# Patient Record
Sex: Female | Born: 1960
Health system: Southern US, Community
[De-identification: ages and names within clinical notes are randomized; demographics above are authoritative.]

## PROBLEM LIST (undated history)

## (undated) DIAGNOSIS — R202 Paresthesia of skin: Secondary | ICD-10-CM

## (undated) DIAGNOSIS — F419 Anxiety disorder, unspecified: Secondary | ICD-10-CM

## (undated) DIAGNOSIS — M549 Dorsalgia, unspecified: Secondary | ICD-10-CM

## (undated) DIAGNOSIS — K219 Gastro-esophageal reflux disease without esophagitis: Secondary | ICD-10-CM

## (undated) DIAGNOSIS — Z9889 Other specified postprocedural states: Secondary | ICD-10-CM

## (undated) DIAGNOSIS — R351 Nocturia: Secondary | ICD-10-CM

## (undated) DIAGNOSIS — I4891 Unspecified atrial fibrillation: Secondary | ICD-10-CM

## (undated) DIAGNOSIS — F32A Depression, unspecified: Secondary | ICD-10-CM

## (undated) DIAGNOSIS — Z8709 Personal history of other diseases of the respiratory system: Secondary | ICD-10-CM

## (undated) DIAGNOSIS — M779 Enthesopathy, unspecified: Secondary | ICD-10-CM

## (undated) DIAGNOSIS — R200 Anesthesia of skin: Secondary | ICD-10-CM

## (undated) DIAGNOSIS — M2550 Pain in unspecified joint: Secondary | ICD-10-CM

## (undated) DIAGNOSIS — G4730 Sleep apnea, unspecified: Secondary | ICD-10-CM

## (undated) DIAGNOSIS — G43909 Migraine, unspecified, not intractable, without status migrainosus: Secondary | ICD-10-CM

## (undated) DIAGNOSIS — G540 Brachial plexus disorders: Secondary | ICD-10-CM

## (undated) DIAGNOSIS — R112 Nausea with vomiting, unspecified: Secondary | ICD-10-CM

## (undated) DIAGNOSIS — R911 Solitary pulmonary nodule: Secondary | ICD-10-CM

## (undated) DIAGNOSIS — K5790 Diverticulosis of intestine, part unspecified, without perforation or abscess without bleeding: Secondary | ICD-10-CM

## (undated) DIAGNOSIS — F329 Major depressive disorder, single episode, unspecified: Secondary | ICD-10-CM

## (undated) DIAGNOSIS — G4700 Insomnia, unspecified: Secondary | ICD-10-CM

## (undated) DIAGNOSIS — Z8719 Personal history of other diseases of the digestive system: Secondary | ICD-10-CM

## (undated) DIAGNOSIS — R3915 Urgency of urination: Secondary | ICD-10-CM

## (undated) DIAGNOSIS — K589 Irritable bowel syndrome without diarrhea: Secondary | ICD-10-CM

## (undated) DIAGNOSIS — E119 Type 2 diabetes mellitus without complications: Secondary | ICD-10-CM

## (undated) DIAGNOSIS — E039 Hypothyroidism, unspecified: Secondary | ICD-10-CM

## (undated) HISTORY — DX: Sleep apnea, unspecified: G47.30

## (undated) HISTORY — DX: Type 2 diabetes mellitus without complications: E11.9

## (undated) HISTORY — DX: Migraine, unspecified, not intractable, without status migrainosus: G43.909

## (undated) HISTORY — PX: CHOLECYSTECTOMY: SHX55

## (undated) HISTORY — PX: ABDOMINAL EXPLORATION SURGERY: SHX538

## (undated) HISTORY — DX: Hypothyroidism, unspecified: E03.9

## (undated) HISTORY — DX: Gastro-esophageal reflux disease without esophagitis: K21.9

## (undated) HISTORY — PX: TONSILLECTOMY: SUR1361

## (undated) HISTORY — PX: THYROIDECTOMY: SHX17

## (undated) HISTORY — PX: ESOPHAGOGASTRODUODENOSCOPY: SHX1529

## (undated) HISTORY — PX: COLONOSCOPY: SHX174

---

## 1998-05-17 ENCOUNTER — Observation Stay (HOSPITAL_COMMUNITY): Admission: EM | Admit: 1998-05-17 | Discharge: 1998-05-18 | Payer: Self-pay | Admitting: Emergency Medicine

## 1998-11-29 ENCOUNTER — Other Ambulatory Visit: Admission: RE | Admit: 1998-11-29 | Discharge: 1998-11-29 | Payer: Self-pay | Admitting: *Deleted

## 1999-11-27 ENCOUNTER — Other Ambulatory Visit: Admission: RE | Admit: 1999-11-27 | Discharge: 1999-11-27 | Payer: Self-pay | Admitting: *Deleted

## 2000-09-18 ENCOUNTER — Encounter: Payer: Self-pay | Admitting: Emergency Medicine

## 2000-09-18 ENCOUNTER — Emergency Department (HOSPITAL_COMMUNITY): Admission: EM | Admit: 2000-09-18 | Discharge: 2000-09-18 | Payer: Self-pay | Admitting: Emergency Medicine

## 2000-09-19 ENCOUNTER — Emergency Department (HOSPITAL_COMMUNITY): Admission: EM | Admit: 2000-09-19 | Discharge: 2000-09-19 | Payer: Self-pay | Admitting: Emergency Medicine

## 2001-01-15 ENCOUNTER — Other Ambulatory Visit: Admission: RE | Admit: 2001-01-15 | Discharge: 2001-01-15 | Payer: Self-pay | Admitting: *Deleted

## 2001-04-17 ENCOUNTER — Ambulatory Visit (HOSPITAL_COMMUNITY): Admission: RE | Admit: 2001-04-17 | Discharge: 2001-04-17 | Payer: Self-pay | Admitting: General Surgery

## 2001-10-26 ENCOUNTER — Emergency Department (HOSPITAL_COMMUNITY): Admission: EM | Admit: 2001-10-26 | Discharge: 2001-10-27 | Payer: Self-pay | Admitting: Emergency Medicine

## 2001-10-26 ENCOUNTER — Encounter: Payer: Self-pay | Admitting: Emergency Medicine

## 2001-10-31 ENCOUNTER — Encounter: Payer: Self-pay | Admitting: Internal Medicine

## 2001-10-31 ENCOUNTER — Ambulatory Visit (HOSPITAL_COMMUNITY): Admission: RE | Admit: 2001-10-31 | Discharge: 2001-10-31 | Payer: Self-pay | Admitting: Internal Medicine

## 2001-11-28 ENCOUNTER — Ambulatory Visit (HOSPITAL_COMMUNITY): Admission: RE | Admit: 2001-11-28 | Discharge: 2001-11-28 | Payer: Self-pay | Admitting: Gastroenterology

## 2001-11-28 ENCOUNTER — Encounter (INDEPENDENT_AMBULATORY_CARE_PROVIDER_SITE_OTHER): Payer: Self-pay | Admitting: Specialist

## 2001-11-29 ENCOUNTER — Encounter: Payer: Self-pay | Admitting: Emergency Medicine

## 2001-11-29 ENCOUNTER — Emergency Department (HOSPITAL_COMMUNITY): Admission: EM | Admit: 2001-11-29 | Discharge: 2001-11-29 | Payer: Self-pay | Admitting: Emergency Medicine

## 2002-06-17 ENCOUNTER — Encounter: Payer: Self-pay | Admitting: Family Medicine

## 2002-06-17 ENCOUNTER — Ambulatory Visit (HOSPITAL_COMMUNITY): Admission: RE | Admit: 2002-06-17 | Discharge: 2002-06-17 | Payer: Self-pay | Admitting: Family Medicine

## 2003-05-18 ENCOUNTER — Ambulatory Visit (HOSPITAL_COMMUNITY): Admission: RE | Admit: 2003-05-18 | Discharge: 2003-05-18 | Payer: Self-pay | Admitting: *Deleted

## 2003-05-18 ENCOUNTER — Encounter: Payer: Self-pay | Admitting: *Deleted

## 2004-02-07 ENCOUNTER — Encounter (HOSPITAL_COMMUNITY): Admission: RE | Admit: 2004-02-07 | Discharge: 2004-05-07 | Payer: Self-pay | Admitting: Endocrinology

## 2004-02-29 ENCOUNTER — Emergency Department (HOSPITAL_COMMUNITY): Admission: EM | Admit: 2004-02-29 | Discharge: 2004-03-01 | Payer: Self-pay | Admitting: Podiatry

## 2004-05-28 ENCOUNTER — Inpatient Hospital Stay (HOSPITAL_COMMUNITY): Admission: EM | Admit: 2004-05-28 | Discharge: 2004-06-01 | Payer: Self-pay | Admitting: *Deleted

## 2004-08-15 ENCOUNTER — Encounter: Admission: RE | Admit: 2004-08-15 | Discharge: 2004-08-15 | Payer: Self-pay | Admitting: Thoracic Surgery

## 2004-10-05 ENCOUNTER — Encounter (INDEPENDENT_AMBULATORY_CARE_PROVIDER_SITE_OTHER): Payer: Self-pay | Admitting: Specialist

## 2004-10-06 ENCOUNTER — Inpatient Hospital Stay (HOSPITAL_COMMUNITY): Admission: RE | Admit: 2004-10-06 | Discharge: 2004-10-07 | Payer: Self-pay | Admitting: Surgery

## 2004-11-20 ENCOUNTER — Ambulatory Visit (HOSPITAL_COMMUNITY): Admission: RE | Admit: 2004-11-20 | Discharge: 2004-11-20 | Payer: Self-pay | Admitting: Family Medicine

## 2004-12-01 ENCOUNTER — Ambulatory Visit: Admission: RE | Admit: 2004-12-01 | Discharge: 2004-12-01 | Payer: Self-pay | Admitting: Family Medicine

## 2004-12-13 ENCOUNTER — Encounter: Admission: RE | Admit: 2004-12-13 | Discharge: 2004-12-13 | Payer: Self-pay | Admitting: Thoracic Surgery

## 2005-03-14 ENCOUNTER — Encounter: Admission: RE | Admit: 2005-03-14 | Discharge: 2005-03-14 | Payer: Self-pay | Admitting: Obstetrics and Gynecology

## 2005-04-18 ENCOUNTER — Emergency Department (HOSPITAL_COMMUNITY): Admission: EM | Admit: 2005-04-18 | Discharge: 2005-04-19 | Payer: Self-pay | Admitting: Emergency Medicine

## 2005-06-03 ENCOUNTER — Emergency Department (HOSPITAL_COMMUNITY): Admission: EM | Admit: 2005-06-03 | Discharge: 2005-06-03 | Payer: Self-pay | Admitting: Family Medicine

## 2005-06-25 ENCOUNTER — Emergency Department (HOSPITAL_COMMUNITY): Admission: EM | Admit: 2005-06-25 | Discharge: 2005-06-25 | Payer: Self-pay | Admitting: Family Medicine

## 2005-07-03 ENCOUNTER — Encounter: Admission: RE | Admit: 2005-07-03 | Discharge: 2005-07-03 | Payer: Self-pay | Admitting: Thoracic Surgery

## 2005-10-21 ENCOUNTER — Emergency Department (HOSPITAL_COMMUNITY): Admission: EM | Admit: 2005-10-21 | Discharge: 2005-10-21 | Payer: Self-pay | Admitting: Emergency Medicine

## 2005-10-28 ENCOUNTER — Emergency Department (HOSPITAL_COMMUNITY): Admission: EM | Admit: 2005-10-28 | Discharge: 2005-10-28 | Payer: Self-pay | Admitting: Emergency Medicine

## 2005-10-30 ENCOUNTER — Ambulatory Visit (HOSPITAL_COMMUNITY): Admission: RE | Admit: 2005-10-30 | Discharge: 2005-10-30 | Payer: Self-pay | Admitting: Obstetrics and Gynecology

## 2005-10-30 ENCOUNTER — Encounter (INDEPENDENT_AMBULATORY_CARE_PROVIDER_SITE_OTHER): Payer: Self-pay | Admitting: *Deleted

## 2005-10-31 ENCOUNTER — Inpatient Hospital Stay (HOSPITAL_COMMUNITY): Admission: AD | Admit: 2005-10-31 | Discharge: 2005-10-31 | Payer: Self-pay | Admitting: Obstetrics and Gynecology

## 2005-11-21 ENCOUNTER — Emergency Department (HOSPITAL_COMMUNITY): Admission: EM | Admit: 2005-11-21 | Discharge: 2005-11-21 | Payer: Self-pay | Admitting: Emergency Medicine

## 2005-11-30 ENCOUNTER — Inpatient Hospital Stay (HOSPITAL_COMMUNITY): Admission: EM | Admit: 2005-11-30 | Discharge: 2005-12-04 | Payer: Self-pay | Admitting: Emergency Medicine

## 2005-12-25 ENCOUNTER — Emergency Department (HOSPITAL_COMMUNITY): Admission: EM | Admit: 2005-12-25 | Discharge: 2005-12-25 | Payer: Self-pay | Admitting: Emergency Medicine

## 2006-01-01 ENCOUNTER — Emergency Department (HOSPITAL_COMMUNITY): Admission: EM | Admit: 2006-01-01 | Discharge: 2006-01-01 | Payer: Self-pay | Admitting: Emergency Medicine

## 2006-01-17 ENCOUNTER — Emergency Department (HOSPITAL_COMMUNITY): Admission: EM | Admit: 2006-01-17 | Discharge: 2006-01-17 | Payer: Self-pay | Admitting: Emergency Medicine

## 2006-01-22 ENCOUNTER — Emergency Department (HOSPITAL_COMMUNITY): Admission: EM | Admit: 2006-01-22 | Discharge: 2006-01-22 | Payer: Self-pay | Admitting: Emergency Medicine

## 2006-01-23 ENCOUNTER — Encounter: Admission: RE | Admit: 2006-01-23 | Discharge: 2006-01-23 | Payer: Self-pay | Admitting: Thoracic Surgery

## 2006-01-29 ENCOUNTER — Emergency Department (HOSPITAL_COMMUNITY): Admission: EM | Admit: 2006-01-29 | Discharge: 2006-01-29 | Payer: Self-pay | Admitting: Emergency Medicine

## 2006-06-20 ENCOUNTER — Emergency Department (HOSPITAL_COMMUNITY): Admission: EM | Admit: 2006-06-20 | Discharge: 2006-06-21 | Payer: Self-pay | Admitting: Emergency Medicine

## 2006-09-29 ENCOUNTER — Emergency Department (HOSPITAL_COMMUNITY): Admission: EM | Admit: 2006-09-29 | Discharge: 2006-09-29 | Payer: Self-pay | Admitting: Emergency Medicine

## 2006-10-25 ENCOUNTER — Emergency Department (HOSPITAL_COMMUNITY): Admission: EM | Admit: 2006-10-25 | Discharge: 2006-10-25 | Payer: Self-pay | Admitting: Emergency Medicine

## 2006-11-11 ENCOUNTER — Encounter: Admission: RE | Admit: 2006-11-11 | Discharge: 2006-11-11 | Payer: Self-pay | Admitting: Anesthesiology

## 2006-12-08 ENCOUNTER — Emergency Department (HOSPITAL_COMMUNITY): Admission: EM | Admit: 2006-12-08 | Discharge: 2006-12-08 | Payer: Self-pay | Admitting: Emergency Medicine

## 2007-02-04 ENCOUNTER — Encounter: Admission: RE | Admit: 2007-02-04 | Discharge: 2007-02-04 | Payer: Self-pay | Admitting: Thoracic Surgery

## 2007-02-04 ENCOUNTER — Ambulatory Visit: Payer: Self-pay | Admitting: Thoracic Surgery

## 2007-02-25 ENCOUNTER — Emergency Department (HOSPITAL_COMMUNITY): Admission: EM | Admit: 2007-02-25 | Discharge: 2007-02-25 | Payer: Self-pay | Admitting: Emergency Medicine

## 2007-05-27 ENCOUNTER — Inpatient Hospital Stay (HOSPITAL_COMMUNITY): Admission: EM | Admit: 2007-05-27 | Discharge: 2007-05-30 | Payer: Self-pay | Admitting: Emergency Medicine

## 2007-09-23 ENCOUNTER — Emergency Department (HOSPITAL_COMMUNITY): Admission: EM | Admit: 2007-09-23 | Discharge: 2007-09-23 | Payer: Self-pay | Admitting: Emergency Medicine

## 2008-02-10 ENCOUNTER — Emergency Department (HOSPITAL_COMMUNITY): Admission: EM | Admit: 2008-02-10 | Discharge: 2008-02-10 | Payer: Self-pay | Admitting: Emergency Medicine

## 2008-06-11 ENCOUNTER — Emergency Department (HOSPITAL_COMMUNITY): Admission: EM | Admit: 2008-06-11 | Discharge: 2008-06-11 | Payer: Self-pay | Admitting: Emergency Medicine

## 2008-08-02 ENCOUNTER — Emergency Department (HOSPITAL_COMMUNITY): Admission: EM | Admit: 2008-08-02 | Discharge: 2008-08-02 | Payer: Self-pay | Admitting: Emergency Medicine

## 2009-04-28 ENCOUNTER — Emergency Department (HOSPITAL_COMMUNITY): Admission: EM | Admit: 2009-04-28 | Discharge: 2009-04-28 | Payer: Self-pay | Admitting: Emergency Medicine

## 2009-06-29 ENCOUNTER — Emergency Department (HOSPITAL_COMMUNITY): Admission: EM | Admit: 2009-06-29 | Discharge: 2009-06-30 | Payer: Self-pay | Admitting: Emergency Medicine

## 2009-08-05 ENCOUNTER — Encounter: Admission: RE | Admit: 2009-08-05 | Discharge: 2009-08-05 | Payer: Self-pay | Admitting: Family Medicine

## 2009-09-18 ENCOUNTER — Emergency Department (HOSPITAL_COMMUNITY): Admission: EM | Admit: 2009-09-18 | Discharge: 2009-09-18 | Payer: Self-pay | Admitting: Emergency Medicine

## 2010-03-19 ENCOUNTER — Emergency Department (HOSPITAL_COMMUNITY): Admission: EM | Admit: 2010-03-19 | Discharge: 2010-03-19 | Payer: Self-pay | Admitting: Emergency Medicine

## 2010-04-12 ENCOUNTER — Emergency Department (HOSPITAL_COMMUNITY): Admission: EM | Admit: 2010-04-12 | Discharge: 2010-04-12 | Payer: Self-pay | Admitting: Emergency Medicine

## 2010-08-21 ENCOUNTER — Encounter: Admission: RE | Admit: 2010-08-21 | Discharge: 2010-08-21 | Payer: Self-pay | Admitting: Obstetrics

## 2010-09-10 ENCOUNTER — Emergency Department (HOSPITAL_COMMUNITY)
Admission: EM | Admit: 2010-09-10 | Discharge: 2010-09-10 | Payer: Self-pay | Source: Home / Self Care | Admitting: Emergency Medicine

## 2010-09-24 ENCOUNTER — Emergency Department (HOSPITAL_COMMUNITY)
Admission: EM | Admit: 2010-09-24 | Discharge: 2010-09-25 | Payer: Self-pay | Source: Home / Self Care | Admitting: Emergency Medicine

## 2010-10-21 ENCOUNTER — Encounter: Payer: Self-pay | Admitting: Obstetrics

## 2011-01-01 LAB — DIFFERENTIAL
Eosinophils Absolute: 0.1 10*3/uL (ref 0.0–0.7)
Eosinophils Relative: 1 % (ref 0–5)
Lymphs Abs: 1.6 10*3/uL (ref 0.7–4.0)
Monocytes Absolute: 0.2 10*3/uL (ref 0.1–1.0)
Monocytes Relative: 3 % (ref 3–12)

## 2011-01-01 LAB — BASIC METABOLIC PANEL
CO2: 24 mEq/L (ref 19–32)
Chloride: 104 mEq/L (ref 96–112)
GFR calc Af Amer: >60 mL/min (ref >60)
Potassium: 3.7 mEq/L (ref 3.5–5.1)

## 2011-01-01 LAB — CBC
HCT: 43.5 % (ref 36.0–46.0)
Hemoglobin: 14.9 g/dL (ref 12.0–15.0)
MCHC: 34.2 g/dL (ref 30.0–36.0)
MCV: 91.5 fL (ref 78.0–100.0)
RBC: 4.76 MIL/uL (ref 3.87–5.11)
WBC: 7.7 10*3/uL (ref 4.0–10.5)

## 2011-01-01 LAB — POCT CARDIAC MARKERS: Myoglobin, poc: 93 ng/mL (ref 12–200)

## 2011-02-13 NOTE — H&P (Signed)
Holly [name], Holly [name]              ACCOUNT NO.:  [id number]   MEDICAL RECORD NO.:  [id number]          PATIENT TYPE:  INP   LOCATION:  0103                         FACILITY:  [facility]   PHYSICIAN:  Michelene Gardener, MD    DATE OF BIRTH:  [birth year]   DATE OF ADMISSION:  05/27/2007  DATE OF DISCHARGE:                              HISTORY & PHYSICAL   PRIMARY CARE PHYSICIAN:  Holley Bouche, M.D.   CHIEF COMPLAINT:  Increasing chest pain.   HISTORY OF PRESENT ILLNESS:  This is a 50 year old female with past  medical history of anxiety, GERD, hypothyroidism, and migraines who now  presents with above-mentioned complaint . Her condition started 2-3 days  ago when she developed lower mid sternal chest pain described as sharp  and sometimes feeling of tightness in the middle of her chest, ranging  from 5 to 9/10 with radiation to her left shoulder, associated with some  numbness in her hands.  She also had nausea, and she vomited once.  Today her pain was increasing.  It is around 9/10, and as stated, it is  associated with nausea.  She has vomited once, and she was clammy.  She  was also having increasing shortness of breath almost all of the time,  especially with her pain.  She is known for anxiety, but she says this  pain is different from whatever she had before.   PAST MEDICAL HISTORY:  Significant for:  1. Hypothyroidism.  2. Anxiety.  3. GERD.  4. Previous history of chest pain for which she was admitted in August      2005.   PAST SURGICAL HISTORY:  Laparoscopic cholecystectomy.   ALLERGIES:  No known drug allergies.   MEDICATIONS:  1. Synthroid 137 mcg p.o. once daily.  2. Prevacid 30 mg p.o. once daily.  3. Hydromorphone 4 mg q. 4 h as needed.  4. Demerol 1.5 q. 6 h as needed.  5. Ativan 2 mg q. 8 h as needed.   FAMILY HISTORY:  A sister has diabetes.  Her mother has history of  diabetes. Two of her aunts had coronary artery disease.  All  grandparents had coronary  artery disease. Her grandfather did have  coronary artery disease at age 63.   SOCIAL HISTORY:  Denies smoking, denies alcohol ingestion, denies  recreational drugs.  She is married and lives with her husband.   REVIEW OF SYSTEMS:  As in HPI.   PHYSICAL EXAMINATION:  VITAL SIGNS:  Temperature 98.4, blood pressure  129/84, pulse 98, respiratory rate 18.  GENERAL APPEARANCE:  This is an obese, middle-age, Caucasian female not  in acute distress.  HEENT:  Conjunctivae showed no erythema.  Pupils equal, round, and  reactive to light and accommodation.  There is no ptosis.  Hearing is  intact.  There is no ear discharge or infection.  There is no nasal  discharge, infection, or bleeding.  Oral mucosa is dry.  No pharyngeal  erythema.  NECK:  Supple.  No JVD, no carotid bruit, no lymphadenopathy.  No  thyroid enlargement, no thyroid tenderness.  CARDIOVASCULAR:  S1  and S2 regular.  There are no murmurs, no gallops,  and no thrills.  RESPIRATORY:  On examination, the patient is breathing between 16-18.  There is no use of accessory muscles.  No intercostal retractions.  No  dullness, no rales, no rhonchi, no wheezes.  ABDOMEN:  Soft, nondistended, nontender.  No hepatosplenomegaly.  Bowel  sounds are normal.  Umbilicus is central.  EXTREMITIES:  Lower extremities are without edema.  No rash, no varicose  veins.  SKIN:  No rash and no erythema.  NEUROLOGIC:  Cranial nerves II-XII intact.  There are no motor or  sensory deficits.   LABORATORY RESULTS:  WBC 5.6, hemoglobin 13.7, hematocrit 39.8, MCV 88,  platelet count 237.  AST 24, ALT 16, bilirubin 0.6.  Sodium 141,  potassium 3.6, chloride 105, bicarb 29, glucose 160, BUN 11, creatinine  0.92, calcium 9.7.  CK-MB 2.1, troponin less than 0.05.   Chest x-ray showed no active disease.   EKG is normal sinus rhythm at rate of 94 beats per minute.  There is no  evidence of ischemia.   IMPRESSION AND ASSESSMENT:  1. Chest pain:  This  patient's pain is typical.  Risk factors would      include obesity and positive family history.  She also has history      of gastroesophageal reflux disease and anxiety that might      contributed to her pain.  I will admit her to telemetry.  I will      get 3 sets of troponin and cardiac enzymes.  Will get serial EKGs.      We will consider cardiology evaluation in the morning for inpatient      versus outpatient stress test.  2. Gastroesophageal reflux disease:  Will continue Prevacid.  3. Anxiety:  Will continue Ativan as needed.  4. Migraines:  This patient is taking Demerol and hydromorphone as      needed.  She has been taking this since 2007 and has been followed      by pain clinic.  So we will continue as given as an outpatient.  5. Hypothyroidism:  Will continue Synthroid and will get a TSH level.   TIME AT DISCHARGE:  1 hour.      Michelene Gardener, MD  Electronically Signed     NAE/MEDQ  D:  05/27/2007  T:  05/27/2007  Job:  [id number]   cc:   Holley Bouche, M.D.  Fax: [phone]

## 2011-02-13 NOTE — Discharge Summary (Signed)
NAMEBRITTANYA, Holly [name]              ACCOUNT NO.:  [id number]   MEDICAL RECORD NO.:  [id number]          PATIENT TYPE:  INP   LOCATION:  1428                         FACILITY:  [facility]   PHYSICIAN:  [name], M.D.DATE OF BIRTH:  1961-03-05   DATE OF ADMISSION:  05/27/2007  DATE OF DISCHARGE:  05/30/2007                               DISCHARGE SUMMARY   DISCHARGE DIAGNOSES:  1. Chest pain, cardiac versus gastrointestinal/gastroesophageal reflux      disease.  Outpatient stress test per cardiology (Dr. Anne Fu) ruled      out for myocardial infarction by cardiac enzymes.  2. Gastroesophageal reflux disease.  3. Hypothyroidism.  4. Anxiety.  5. Chronic migraines.   CONSULTATIONS:  Cardiology - Dr. Anne Fu [facility]).   PROCEDURES AND STUDIES:  CT angiogram of chest:  Negative for pulmonary  emboli.   BRIEF HISTORY:  The patient is a 50 year old white female with the above-  listed medical problems, who presented with complaints of increasing  chest pain.  She reported that, about two to three days prior to  admission, she developed lower mid-sternal chest pain, described as  sharp, and a feeling of tightness in the middle of her chest, ranging  from 5-9/10 in intensity, and radiating to her left shoulder, also  associated with numbness in her hands.  She admitted to associated  nausea and vomiting times one.  She stated that, on the day of  admission, the pain was worsening and she also felt clammy with  increasing shortness of breath and so she came to the ER.   Please see the full admission history and physical, dictated on May 27, 2007, by Dr. Arthor Captain for the details of the admission physical exam,  as well as the laboratory data.   HOSPITAL COURSE:  1. Chest pain:  Upon admission, the patient was placed on aspirin,      p.r.n. nitrates and a beta blocker.  Serial cardiac enzymes were      done and these were negative for MI.  The patient continued to have   chest pain and so cardiology was consulted for further      recommendations.  Dr. Anne Fu saw the patient and, following his      evaluation, indicated that it was reassuring that her cardiac      enzymes were negative times three and also her EKG with no ischemic      changes, and so decided on doing an outpatient stress test, which      his office will schedule.  The patient is discharged on aspirin and      p.r.n. nitrates and is to follow up with cardiology, as already      indicated, as well as her primary care physician.  She had one      blood glucose that was elevated at 160 and, as part of the      evaluation of her risk factors for heart disease, a hemoglobin A1c      was done and this was within normal limits at 6.  She is to follow  up with her primary care physician for further monitoring.  She has      been instructed on lifestyle changes, including her diet and      exercise.  2. GERD:  The patient was placed on a PPI, Prevacid b.i.d., during her      hospital stay.  Carafate was also added, but the patient stated      that she did not get any further relief with the Carafate and so      this has been discontinued.  The patient is to follow up with her      gastroenterologist upon discharge, as instructed.  3. Chronic migraine headaches:  Patient to continue her preadmission      medications and is to follow up at the pain clinic today, as      scheduled.  4. Hypothyroidism:  The patient had a TSH level done in the hospital      and this was within normal limits at 2.40.  She is to continue her      Synthroid upon discharge.   DISCHARGE MEDICATIONS:  1. Aspirin 81 mg p.o. daily.  2. Nitroglycerin 0.4 mg p.r.n.  3. Patient to continue preadmission medications:  Synthroid, Prevacid,      hydromorphone, Demerol, Ativan.   DISCHARGE CONDITION:  Stable.   FOLLOWUP CARE:  1. Dr. Anne Fu as scheduled for outpatient stress test and 2D echo.  2. Dr. Tiburcio Pea as scheduled.   3. Dr. Danise Edge, GI, patient to call for appointment.      [name], M.D.  Electronically Signed     ACV/MEDQ  D:  05/30/2007  T:  05/30/2007  Job:  [id number]   cc:   Holley Bouche, M.D.  Fax: [phone]   Jake Bathe, MD  Fax: [phone]   Danise Edge, M.D.  Fax: [phone]

## 2011-02-13 NOTE — Consult Note (Signed)
Holly [name], Holly [name]              ACCOUNT NO.:  [id number]   MEDICAL RECORD NO.:  [id number]          PATIENT TYPE:  INP   LOCATION:  1428                         FACILITY:  [facility]   PHYSICIAN:  Jake Bathe, MD      DATE OF BIRTH:  09/25/1961   DATE OF CONSULTATION:  DATE OF DISCHARGE:                                 CONSULTATION   REFERRING PHYSICIAN:  Nadear A. Arthor Captain, MD   REASON FOR CONSULTATION:  [name] is being seen at the request of  Dr. Arthor Captain for the evaluation of chest pain.   HISTORY OF PRESENT ILLNESS:  Holly [name] is a 50 year old female with  chronic migraines, hypothyroidism and gastroesophageal reflux disease  with previous history of esophageal spasm, who was admitted after 3 days  of intermittent substernal chest pain which was sometimes described as a  pressure and sometimes sharp with radiation over the chest wall.  She  noted this once while shopping and with activity and had associated  nausea and vomiting.  This prompted her to come to the [facility] emergency department, where she was admitted for further  evaluation.  Thus far her cardiac enzymes have all been normal.  Her D-  dimer was positive and she had a CT angiogram of her chest, which showed  no evidence of pulmonary embolism.   She does not complain of any syncope or presyncope, however did have a  few palpitations and has chronic migraines and fatigue.  With this chest  pain she also had associated dyspnea and has noted over the past several  weeks some increase in work of breathing.   PAST MEDICAL HISTORY:  1. Chronic migraines.  2. Hypothyroidism.  3. GERD.  4. Esophageal spasm.  5. Obesity.   PAST SURGICAL HISTORY:  Cholecystectomy.   ALLERGIES:  MORPHINE and PHENERGAN.   MEDICATIONS HERE:  1. Aspirin 81 mg once a day.  2. Lovenox 40 mg subcu daily.  3. Prevacid 30 mg once a day.  4. Synthroid 137 mcg once a day.  5. Lopressor 12.5 mg twice a day.   SOCIAL  HISTORY:  The patient denies any previous or current tobacco use,  alcohol use, or illicit drug use.   FAMILY HISTORY:  Her mother has diabetes, her father has hypertension,  but no early family history of coronary artery disease.   REVIEW OF SYSTEMS:  Unless specified above, all of 10 review of systems  negative.   PHYSICAL EXAMINATION:  VITAL SIGNS:  Temperature 97.7, pulse 71,  respiratory rate 18, blood pressure 102/69, saturating 97% on room air.  GENERAL:  Alert and oriented x3, lying in the bed, looking mildly  uncomfortable and tired, in no acute distress.  EYES:  Well-perfused conjunctivae, no scleral icterus.  HEENT:  Neck is supple.  No thyromegaly.  No carotid bruits bilaterally.  No JVD.  CARDIOVASCULAR:  Regular rate and rhythm.  No murmurs, rubs or gallops.  A normal PMI.  LUNGS:  Clear to auscultation bilaterally.  No wheezes.  No rales.  ABDOMEN:  Obese, positive bowel sounds.  Nontender.  EXTREMITIES:  No clubbing, cyanosis, or edema.  Trace pretibial edema  noted bilaterally.  2+ dorsalis pedis pulses, bilateral lower  extremities.  NEUROLOGIC:  Nonfocal.  No tremors.  SKIN:  Warm, dry and intact.   LABORATORY DATA:  D-dimer 0.64, which is elevated.  TSH 2.4, normal.  Cardiac enzymes negative x3.  Potassium 3.6, creatinine 0.9, glucose  160, which is elevated.  White count 5.6, hematocrit 39.8, platelets  237.  LFTs were within normal limits.  Chest x-ray showed no acute  disease.  Chest CT showed no evidence of pulmonary embolism.  EKG  demonstrates normal sinus rhythm, rate of 67, with nonspecific ST-T  changes.   ASSESSMENT AND PLAN:  A 50 year old female with possible acute coronary  syndrome, admitted to [facility] for further observation with  chronic migraines and newly-associated dyspnea.   1. Possible acute coronary syndrome.  The patient's cardiac enzymes x3      have been normal.  The EKG has not shown any specific ischemic      changes,  which is reassuring.  She does, however, still have      occasional bouts of this chest discomfort.  According to her HPI,      she does have some typical symptoms for angina but I have reassured      her that she has not had a myocardial infarction during this      hospitalization.  Given these typical anginal features, I will      schedule her for an outpatient cardiac stress test.  2. Dyspnea.  This has been associated with her chest discomfort and      she has noted increased dyspnea on exertion over the past few      months.  Given this finding also, I will perform an echocardiogram      to further evaluate her LV function and to assure that there is no      diastolic dysfunction present.  3. Chronic migraines.  Difficult to control on prophylactic medicine.  4. History of esophageal spasm.  Pain may be secondary to      gastroesophageal reflux disease and/or esophageal spasm; however,      it is necessary to rule out possible cardiac etiologies.      Jake Bathe, MD  Electronically Signed     MCS/MEDQ  D:  05/28/2007  T:  05/29/2007  Job:  [id number]   cc:   Michelene Gardener, MD

## 2011-02-16 NOTE — Op Note (Signed)
[name], [name]              ACCOUNT NO.:  [id number]   MEDICAL RECORD NO.:  [id number]          PATIENT TYPE:  AMB   LOCATION:  DAY                          FACILITY:  [facility]   PHYSICIAN:  Velora Heckler, MD      DATE OF BIRTH:  Aug 12, 1961   DATE OF PROCEDURE:  10/05/2004  DATE OF DISCHARGE:                                 OPERATIVE REPORT   PREOPERATIVE DIAGNOSIS:  Hyperthyroidism.   POSTOPERATIVE DIAGNOSIS:  Hyperthyroidism.   PROCEDURE:  Total thyroidectomy.   SURGEON:  Velora Heckler.   ASSISTANTS:  Adolph Pollack, M.D.   ANESTHESIA:  General, per Dr. Ronelle Nigh.   ESTIMATED BLOOD LOSS:  30 cc.   PREPARATION:  Betadine.   COMPLICATIONS:  None.   INDICATIONS:  The patient is a 50 year old white female who presents at the  request of Dorisann Frames, M.D. for hyperthyroidism. The patient has had  thyroid function tests showing significant hyperthyroidism. She underwent  radioactive iodine treatment. However post treatment, she remained  hyperthyroid. She does not wish to have radioactive iodine administration  repeated. Therefore she comes to surgery for thyroidectomy.   DESCRIPTION OF PROCEDURE:  The procedure was done in OR #6 at the [facility]. The patient was brought to the operating room,  placed in supine position on the operating room table. Following  administration of general anesthesia the patient is positioned and then  prepped and draped in usual strict aseptic fashion. After ascertaining  adequate level of anesthesia was obtained, Kocher incision is made with a  #10 blade. Dissection was carried through subcutaneous tissues and platysma.  Hemostasis was obtained with the electrocautery. Skin flaps were elevated  cephalad and caudad from the thyroid notch to the sternal notch. The  Mahorner self-retaining retractor was placed for exposure. Strap muscles  were incised in the midline. Dissection was begun on the left side.  Strap  muscles were reflected laterally. Middle thyroid vein was divided between  Ligaclips.  The gland was gently dissected out. It is somewhat multinodular.  Superior pole was dissected out, ligated in continuity with 2-0 silk ties  and medium Ligaclips, and divided. Gland is rolled medially. Inferior venous  tributaries were divided between Ligaclips. Parathyroid tissue was  identified and preserved. Recurrent laryngeal nerve was identified and  preserved. Branches of the inferior thyroid artery were divided between  small Ligaclips. Ligament of Allyson Sabal was transected with the electrocautery in  the gland is rolled up and onto the anterior surface of the trachea.   Next we turned our attention to the right lobe. Again strap muscles were  reflected laterally. Again middle thyroid vein was divided between  Ligaclips. Gland is rolled medially. Superior pole vessels were dissected  out, ligated in continuity with 2-0 silk ties and medium Ligaclips and  divided. Gland is rolled further anteriorly. Parathyroid tissue was again  identified and preserved. There was a large colloid nodule in the inferior  pole of the gland on the right side. This was partially disrupted during the  case. Inferior venous tributaries were ligated in continuity with 2-0  silk  ties and divided. Branches of the inferior thyroid artery were divided  between small Ligaclips. Gland is rolled anteriorly. The ligament of Allyson Sabal  was transected with electrocautery and the gland is then excised off the  anterior trachea with the electrocautery used for hemostasis. The entire  gland is submitted to pathology for review. Neck is irrigated copiously with  warm saline and good hemostasis was achieved. Surgicel was placed over the  area of the recurrent laryngeal nerves bilaterally. Strap muscles were  reapproximated midline with interrupted 3-0 Vicryl sutures. Platysma was  closed with interrupted 3-0 Vicryl sutures. Skin was  closed with running 4-0  Vicryl subcuticular suture. Wound is washed and dried. Benzoin, Steri-Strips  were applied. Sterile dressings were applied. The patient was awakened from  anesthesia and brought to the recovery room in stable condition. The patient  tolerated the procedure well.     Todd   TMG/MEDQ  D:  10/05/2004  T:  10/05/2004  Job:  [id number]   cc:   Dorisann Frames, M.D.  [address] [place] [address]  Fax: [phone]   Meredith Staggers, M.D.  [address], [address]  [place]  [place] [address]  Fax: [phone]

## 2011-02-16 NOTE — Discharge Summary (Signed)
NAMEDEBORAH, Holly [name]              ACCOUNT NO.:  [id number]   MEDICAL RECORD NO.:  [id number]          PATIENT TYPE:  INP   LOCATION:  3732                         FACILITY:  [facility]   PHYSICIAN:  [name], MD  DATE OF BIRTH:  May 01, 1961   DATE OF ADMISSION:  11/30/2005  DATE OF DISCHARGE:                                 DISCHARGE SUMMARY   CHIEF COMPLAINT AT THE TIME OF ADMISSION:  Nausea, vomiting, and diarrhea,  acute onset x12 hours.   DISCHARGING DIAGNOSES:  1.  Nausea, vomiting, and diarrhea secondary to likely viral      gastroenteritis. Symptoms have resolved with supportive measures.  2.  Migraine. The patient has developed her usual migraine which is      partially responding to her usual treatment of Dilaudid and Phenergan.      She wishes to discharge to home for home care and to visit her      chiropractor. She has an appointment to see Dr. Meryl Crutch on Wednesday and      I will give her enough medication to allow her to make that appointment      Wednesday.  3.  Hypothyroidism. We have continued her on her Synthroid. She will need to      have her TSH checked again in the near future.  4.  Reflux. She has resumed her Prevacid twice daily and she utilizes Pepcid      20 mg b.i.d. p.r.n.  5.  Hyperglycemia. During the course of this illness the patient is noted to      have blood sugars in the 130s to 140s. A hemoglobin A1c is pending and      should be followed up by her primary care physician. I will attempt to      follow this up as well and give the patient a call once it returns.  6.  Right middle lobe lung mass which was diagnosed in the past. She will      continue to follow this with Dr. Edwyna Shell as an outpatient.   MEDICATIONS AT THE TIME OF DISCHARGE:  1.  Synthroid 100 mcg once daily.  2.  Prevacid 30 mg twice daily.  3.  Pepcid as needed.  4.  Lorazepam 1 mg twice daily.  5.  Xanax 2 mg twice daily.  6.  Dilaudid 3 mg per rectum q.6h. p.r.n. for  migraine.  7.  Phenergan - she states she usually takes 50 mg intramuscularly injected;      however, this is above the recommended dosing. I will therefore      prescribe 25 mg intramuscularly every 6 hours until she returns to see      Dr. Meryl Crutch.   The patient has been advised to establish a primary care physician to follow  her blood sugars and hypothyroidism, as well as consider a sleep study for  sleep apnea which may be contributing to some of her migraine disease.   HISTORY OF PRESENT ILLNESS:  The patient is a 50 year old white female with  a past medical history for hypothyroidism and migraines who presented  to the  emergency room after being awakened from sleep acutely in the early morning  hours with nausea, vomiting, and diarrhea that was profuse. She came to the  emergency room. She was treated symptomatically and rehydrated. In the  emergency room she spiked a temperature up to 103. No obvious source was  noted in terms of urine or chest x-ray infection. We felt that this was  likely secondary to a gastroenteritis secondary to virus. The patient  responded favorably to supportive therapy; however, developed a migraine  during the course of the hospital stay which has been relatively refractory  to her usual medications. At this time the patient states she wishes to go  home and see her chiropractor, as well as follow up with Dr. Meryl Crutch on  Wednesday, and she will manage her symptoms using her standard therapy at  home. On the day of discharge the patient did exhibit sinus tachycardia  while in the restroom. I believe this is related to placing a suppository  and/or straining. She has at rest been non tachycardic and has had no  dysrhythmias.   On the day of discharge the patient's vital signs have remained stable, she  has been afebrile. T-max was 99.1, blood pressure 108/78, pulse 84,  respirations 18, saturations 92-95% on room air. Generally, this is a   moderately-obese white female in mild distress secondary to headache. She is  normocephalic, atraumatic. Pupils equal, round, and reactive to light.  Extraocular muscles are intact. Mucous membranes are moist. Neck is supple.  There is no JVD, no lymph nodes, no carotid bruits. The chest is clear to  auscultation. There is no rhonchi, rales, or wheezes. Cardiovascular is  regular rate and rhythm, positive S1 and S2, no S3 or S4. No murmurs, rubs,  or gallops. Abdomen is soft, nontender, nondistended, with positive bowel  sounds. Extremities show no clubbing, cyanosis, or edema. Neurologically she  is awake, alert, and oriented. Cranial nerves II-XII are intact. Power is  5/5. DTRs are 2+.   Please note I offered the patient treatment with Depakene IV. She, however,  stated that she wished to follow up with her usual regime and see Dr.  Meryl Crutch on Wednesday.   PERTINENT LABORATORY VALUES DURING THE COURSE OF THE HOSPITAL STAY:  Reveal  a potassium of 3.9, BUN of 6, creatinine 1.1. CBC is white count of 3.7,  hemoglobin 12.5, hematocrit 36.4, and platelets of 155. Stool cultures have  remained negative. Urine culture has remained negative. C. difficile is  negative. Blood cultures have remained negative x2. As stated, her  hemoglobin A1c is pending.   PERTINENT IMAGING STUDIES:  Two-view chest x-ray showed left lower lobe  atelectasis but no obvious infiltrate.   At this time the patient is deemed stable to follow up as an outpatient for  her migraine disease. I will provide her with minimal doses of Dilaudid  suppositories and Phenergan until she can reach Dr. Meryl Crutch on Wednesday.   Addendum to Discharge:   The patient complained of left sided chest pain with radiation to her neck  and left arm.  An EKG was completed an was NSR with no ST-T wave changes.  Cardiac markers were negative.  Her D-Dimer was slightly elevated, therefore, a CT of the chest was completed.  There was no  evidence for  Pulmonary embolus.  The patient was monitored over night and her symptoms  resolved with treatment of her reflux.    [name], M.D.  12/07/05      [name], MD  Electronically Signed     MLT/MEDQ  D:  12/03/2005  T:  12/03/2005  Job:  [id number]   cc:   Clabe Seal. Meryl Crutch, M.D.  Fax: [phone]

## 2011-02-16 NOTE — Discharge Summary (Signed)
NAME:  Holly [name], Holly [name]                        ACCOUNT NO.:  [id number]   MEDICAL RECORD NO.:  [id number]                   PATIENT TYPE:  INP   LOCATION:  0359                                 FACILITY:  [facility]   PHYSICIAN:  Theone Stanley, MD                DATE OF BIRTH:  1961/03/25   DATE OF ADMISSION:  05/27/2004  DATE OF DISCHARGE:  06/01/2004                                 DISCHARGE SUMMARY   ADMISSION DIAGNOSES:  1.  Chest pain, unclear etiology.  2.  Hyperthyroidism.  3.  Anxiety.  4.  Gastroesophageal reflux disease.   DISCHARGE DIAGNOSES:  1.  Chest pain secondary to costochondritis.  2.  Anxiety.  3.  Gastroesophageal reflux disease.  4.  Hyperthyroidism.   ALLERGIES:  PHENERGAN.   CONSULTATIONS:  GI.   PROCEDURES:  CT angio.   HOSPITAL COURSE:  Holly [name] is a 50 year old white female presenting with  sudden onset of chest pain.  Per the patient, she was shopping in a store at  about 7 p.m. on the 27th when she experienced mid substernal chest  discomfort, mainly localized in the lower sternum.  She said it radiated  down her arm.  It was associated with mild-to-moderate dyspnea.  At that  point, she was admitted to the hospital.  She was given sublingual nitro in  the ER, which did seem to help transiently, with her pain, but this seemed  to come back pretty quickly.  Morphine did relieve the pain.  She did not  seem diaphoretic or nauseated.  At that point in time, EKG, troponin, CK,  and CK-MB were performed.  The EKG showed a normal sinus rhythm.  There was  no elevation in her cardiac enzymes, indicating that this most likely was  not a cardiac event.  A CT angio was then performed, which did not show a  PE; however, it did demonstrate a 5 nodule in the right lower lobe, which  will need followup in 3-6 months.  Because of her extensive history of GERD,  her gastroenterologist was consulted, Dr. Laural Benes.  At that point in time,  it was felt less likely  to be GERD and more likely costochondritis or  possibly pericarditis, mitral valve; however, she did not demonstrate any  abnormalities and neither did EKG.  At that point, a trial of Toradol was  done, and her pain improved with that.  Observed overnight.  Patient did  have pain relief with the Toradol.  She was discharged on September 1 in  stable condition.   DISCHARGE MEDICATIONS:  1.  Motrin 800 mg 1 p.o. t.i.d. p.r.n.  2.  Lortab 5/500 1-2 p.o. q.8h. p.r.n.  3.  She is to continue her Prevacid 30 mg b.i.d.  4.  She takes Pepcid 20 mg q.d.  I instructed her that while she is on the      Motrin, it is probably best  to make it b.i.d.   I instructed her about the side effects of NSAIDs in regards to gastric  distress, and if she continues to have problems with that, she can stop her  Motrin and contact her outside physician.  Patient is to continue the Toprol  and follow up with her primary care physician in 2-3 weeks.                                               Theone Stanley, MD    AEJ/MEDQ  D:  06/01/2004  T:  06/02/2004  Job:  [id number]   cc:   Meredith Staggers, M.D.  [address], [address]  [place]  [place] [address]  Fax: [phone]

## 2011-02-16 NOTE — Op Note (Signed)
[facility]  Patient:    Holly [name], Holly [name]                   MRN: [id number] Proc. Date: 04/17/01 Adm. Date:  [id number] Attending:  Carson Myrtle                           Operative Report  PREOPERATIVE DIAGNOSIS:  Anal fissure.  POSTOPERATIVE DIAGNOSIS:  Anal fissure.  PROCEDURE:  Repair of anal fissure.  SURGEON:  Timothy E. Earlene Plater, M.D.  ANESTHESIA:  General.  INDICATIONS FOR PROCEDURE:  [name] is 57, has gastrointestinal problems with reflux, prior cholecystectomy, and apparent irritable bowel syndrome. She presents with a persistent unrelenting nonhealing anal fissure. After failure of conservative management, she is scheduled for surgery at her wish after careful consultation.  DESCRIPTION OF PROCEDURE:  The patient taken to the operating room, placed supine, LMA anesthesia provided. She was placed in lithotomy, perianal area inspected, prepped and draped in the usual fashion. The anus was tight even under general anesthesia. A posterior anal fissure was present. Moderate external hemorrhoids were present. I injected around and about the anal orifice with 0.5% Marcaine with epinephrine. This was massage in well. A left posterior internal sphincterotomy accomplished with a 15 blade percutaneously. The external sphincter left intact. Anoscopy revealed no significant internal disease, the posterior anal fissure was cauterized. There being no other significant pathology, the procedure was complete. Gelfoam gauze and a dry sterile dressing applied. She tolerated it well and was removed to the recovery room in good condition. DD:  04/17/01 TD:  04/17/01 Job: [id number]

## 2011-02-16 NOTE — Op Note (Signed)
[name], Holly [name]              ACCOUNT NO.:  [id number]   MEDICAL RECORD NO.:  [id number]          PATIENT TYPE:  AMB   LOCATION:                                FACILITY:  [facility]   PHYSICIAN:  Richardean Sale, M.D.   DATE OF BIRTH:  06/25/1961   DATE OF PROCEDURE:  10/30/2005  DATE OF DISCHARGE:  10/30/2005                                 OPERATIVE REPORT   PREOPERATIVE DIAGNOSIS:  Postmenopausal bleeding.   POSTOPERATIVE DIAGNOSIS:  Postmenopausal bleeding.   OPERATION/PROCEDURE:  Hysteroscopy and dilatation and curettage.   SURGEON:  Richardean Sale, M.D.   ASSISTANT:  None.   ANESTHESIA:  General with paracervical block.   ESTIMATED BLOOD LOSS:  Minimal.   FLUID DEFICIT:  20 mL.   FINDINGS:  Significant vulvovaginal and endometrial atrophy.  No obvious  polyps or fibroids noted.   INDICATIONS:  This is a 50 year old, gravida 1, para 1, white female who has  a history of premature ovarian failure and has been postmenopausal for  approximately four years and has not been on any hormone replacement  therapy.  The patient presented with a two to three day episode of vaginal  spotting.  Ultrasound was negative for any obvious pathology.  The patient  was unable to tolerate an office endometrial biopsy.  Therefore, she  presents today for hysteroscopy and dilatation and curettage.  Prior to  procedure, risks and benefits have been reviewed with the patient in detail.  Prior to this procedure, the risks, benefits and alternatives of laparoscopy  were reviewed with the patient in detail.  We discussed risks which include  but are not limited to hemorrhage requiring transfusion, infection, injury  to the uterus with uterine perforation which would require additional  surgery through the abdomen today or injury to any other organs.  In  addition, we discussed anesthesia-related risks.  The patient voiced  understanding of all the above and desired to proceed.  Informed consent  was  obtained before proceeding to the OR.   DESCRIPTION OF PROCEDURE:  The patient was taken to the operating room where  she was given general anesthesia.  She was prepped and draped in the usual  sterile fashion with Betadine.  Bimanual exam was performed which revealed  the presence of a small midline uterus which was mobile.  No obvious adnexal  masses.  Exam slightly compromised by the patient's habitus.  In addition,  there was a third-degree rectocele noticed and a first-degree cystocele.  Red rubber catheter was used to drain the bladder.  A speculum was then  placed in the vagina and the cervix was grasped with the single-tooth  tenaculum.  A paracervical block was then administered using a total of 1mL  of 1% Nesacaine.  The cervix was then very gently dilated with the Hegar  dilators.  The hysteroscope was then introduced and the cavity revealed a  significantly atrophic endometrial lining with no obvious polyps or fibroids  noted.  Both tubal ostia were visualized.  At this point the hysteroscope  was removed and sharp curettage was performed.  There was only  scant  atrophic-appearing tissue returned.  This was sent to pathology labeled as  endometrial curettings.  At this point the procedure was terminated.  The  hysteroscope and single-tooth tenaculum  and speculum were all removed.  There was no bleeding coming from the  cervix.  The patient tolerated the procedure well very well.  All sponge,  lap and needle counts correct x2.  She was taken to the recovery room in  stable condition. There were no complications.      Richardean Sale, M.D.  Electronically Signed     JW/MEDQ  D:  10/30/2005  T:  10/30/2005  Job:  [id number]

## 2011-02-16 NOTE — Procedures (Signed)
[facility]  Patient:    Holly [name], Holly [name] Visit Number: [id number] MRN: [id number]          Service Type: Attending:  Verlin Grills, M.D. Dictated by:   Verlin Grills, M.D. Proc. Date: 11/28/01   CC:         Arvella Merles, M.D.  Hedwig Morton. Juanda Chance, M.D. [facility]   Procedure Report  PROCEDURE:  Esophagogastroduodenoscopy and small bowel biopsy and colonoscopy.  REFERRING PHYSICIAN:  Arvella Merles, M.D., [facility] and Dr. Lina Sar.  PROCEDURE INDICATION:  Ms. Holly [name] is a 50 year old female born 1961/06/18. Ms. Holly [name] has a month long history of postprandial abdominal bloating, bowel urgency, nonbloody diarrhea, and 20 pound weight loss. She has undergone a laparoscopic cholecystectomy in the past.  On October 20, 2001, her complete metabolic profile, thyroid stimulating hormone level, CBC with differential were normal.  On October 30, 2001, her esophagogastroduodenoscopy performed by Dr. Lina Sar was normal. Her serum amylase, lipase, and CBC were normal. A repeat hepatic profile was normal except for a mildly elevated SGPT to 56.  On November 03, 2001, CT scan of the abdomen and pelvis was normal.  On November 07, 2001, stool C. difficile toxin screen was negative; stool culture was negative for enteric pathogens; EIA screen for Giardia and Cryptosporidium was negative.  ENDOSCOPIST:  Verlin Grills, M.D.  PREMEDICATION:  Versed 15 mg, Demerol 100 mg.  ENDOSCOPE:  Olympus Pediatric colonoscope.  PROCEDURE:  Esophagogastroduodenoscopy with small bowel biopsies.  DESCRIPTION OF PROCEDURE:  After obtaining informed consent, Ms. Holly [name] was placed in the left lateral decubitus position. I administered intravenous Demerol and intravenous Versed to achieve conscious sedation for the procedure. The patients blood pressure, oxygen saturation and cardiac rhythm were monitored  throughout the procedure and documented in the medical record.  The Olympus pediatric colonoscope was passed through the posterior hypopharynx into the proximal esophagus without difficulty. The hypopharynx, larynx, and vocal cords appeared normal.  Esophagoscopy:  The proximal, mid, and lower segments of the esophagus appeared completely normal.  Gastroscopy:  Retroflex view of the gastric cardia and fundus was normal. The gastric body, antrum and pylorus appeared normal endoscopically.  Enteroscopy:  The duodenal bulb, mid duodenum, distal duodenum, and proximal jejunum appeared normal endoscopically. Six biopsies were taken from the second-third portions of the duodenum to rule out villous atrophy.  ASSESSMENT:  Normal esophagogastroduodenoscopy. Small bowel biopsies, rule out celiac sprue, pending.  PROCEDURE:  Proctocolonoscopy to the cecum with distal ileoscopy and random colonic biopsies.  DESCRIPTION OF PROCEDURE:  Anal inspection was normal. Digital rectal exam was normal. The Olympus Pediatric video colonoscope was introduced into the rectum and easily advanced to the cecum. A normal appearing ileocecal valve was intubated and the distal ileum inspected. Colonic preparation for the exam today was excellent.  Rectum:  Normal.  Sigmoid colon and descending colon:  Normal.  Splenic flexure:  Normal.  Transverse colon:  Normal.  Hepatic flexure:  Normal.  Ascending colon:  Normal.  Cecum and ileocecal valve:  Normal.  Distal ileum:  Normal.  Biopsies:  Three biopsies were taken from the right colon and three biopsies were taken from the ascending colon and sent to pathology to look for pathological signs for the presence of lymphocytic-collagenous colitis.  ASSESSMENT:  Normal proctocolonoscopy to the cecum with distal ileoscopy. Random colonic biopsies to rule out lymphocytic-collagenous colitis. Dictated by:   Verlin Grills, M.D. Attending:  Charolett Bumpers III, M.D. DD:  11/28/01 TD:  11/30/01 Job: [id number]

## 2011-02-16 NOTE — H&P (Signed)
NAMESRIHITHA, TAGLIAFERRI              ACCOUNT NO.:  [id number]   MEDICAL RECORD NO.:  [id number]          PATIENT TYPE:  INP   LOCATION:  3732                         FACILITY:  [facility]   PHYSICIAN:  [name], MD  DATE OF BIRTH:  Jan 03, 1961   DATE OF ADMISSION:  11/30/2005  DATE OF DISCHARGE:                                HISTORY & PHYSICAL   CHIEF COMPLAINT:  Nausea, vomiting and diarrhea x1 day.   PRIMARY CARE PHYSICIAN:  Unassigned at this time, although she has been a  previous [facility] patient in the past.   HISTORY OF PRESENT ILLNESS:  The patient is a 50 year old white female who  was awakened from sleep with acute-onset nausea, vomiting and diarrhea.  She  has associated abdominal cramping.  She came to the emergency room and was  evaluated initially.  Her labs were within normal limits.  She had no fever;  however, on the following day after hydrating her, she did develop a  temperature as high as 103.  We were asked to admit her for further care.   REVIEW OF SYSTEMS:  No fever prior to admission but now 103.  Positive  nausea, vomiting, abdominal pain, cramping, diarrhea.  No melena, no  hematochezia, no hematemesis, no dysuria.  All other review of systems is  negative with the exception of diffuse muscle pain.   PAST MEDICAL HISTORY:  1.  Hypothyroidism, status post radioiodine ablation, which was not      successful, and resection of her total thyroid with replacement      Synthroid.  2.  She had early menopause.  3.  Fibroid disease.  4.  Migraine.  5.  GERD.  6.  She is being followed for a spot on her lung in the right middle lobe.   PAST SURGICAL HISTORY:  1.  Thyroidectomy.  2.  Tonsillectomy.  3.  She had a D&C.  4.  Had her gallbladder removed.   ALLERGIES:  PHENERGAN, which is really an intolerance.  Evidently her blood  pressure drops with rapid infusion.   SOCIAL HISTORY:  She denies tobacco, ethanol, and she is not currently  working.   FAMILY HISTORY:  Mom is living with cancer of the kidney, diabetes and  hypertension.  Dad is living with hypertension.  Sister has diabetes and  hypertension.   MEDICATIONS:  1.  Synthroid 100 mcg daily.  2.  Lorazepam 1 mg b.i.d.  3.  Xanax 2 mg b.i.d.  4.  Prevacid 30 mg b.i.d.  5.  Pepcid 20 mg b.i.d. p.r.n.  6.  Dilaudid 3 mg suppository for rescue when she has a migraine.  7.  Phenergan injections IM 50 mg for rescue for her migraines.   PHYSICAL EXAMINATION:  VITAL SIGNS:  Temperature was 103.6, down to 101.4,  blood pressure 142/86, pulse 101-116, respiratory rate 22, saturation 99%.  GENERAL:  This is an ill-appearing white female in moderate distress  secondary to high fever and muscle aches.  HEENT:  She is normocephalic, atraumatic.  Pupils equal, round, and reactive  to light.  Extraocular muscles are intact.  Her face is flushed.  Mucous  membranes are moist.  NECK:  Supple.  There is no JVD, no lymph nodes and no carotid bruits.  CHEST:  Decreased at the bases but otherwise clear.  CARDIOVASCULAR:  Tachycardic, positive S1, S2, no S3, S4, no murmurs, rubs  or gallops.  ABDOMEN:  Obese, mildly diffusely tender with positive active bowel sounds.  EXTREMITIES:  Warm with bounding pulses.  SKIN:  There is no rash.  NEUROLOGIC:  She is awake, alert and oriented.  Cranial nerves II-XII are  intact.  Power is 5/5.  DTRs are 2+.   LABORATORY DATA:  Sodium is 135, potassium 3.7, chloride is 106, CO2 is 21,  BUN is 14, creatinine is 1.1, glucose is 208.  Her white count is 7.4,  hemoglobin 14.4, hematocrit 41.7, platelets 178.   ASSESSMENT AND PLAN:  This is a 50 year old white female with acute-onset  nausea, vomiting and diarrhea, without evidence for food-borne illness.  She  did have a sick contact in that her spouse was sick earlier this week with a  milder form of this illness.  Today she has a temperature up to 103 and  continued to have nausea, vomiting and  diarrhea.  The differential diagnosis  includes Norovirus versus occult infection.   1.  Infectious disease.  Will check a urinalysis, cultures and sensitivity,      as well as chest x-ray to rule out occult infection.  At this time I      feel that this is probably a viral illness and will not be prescribing      antibiotics at this time.  I will place her on contact precautions,      continue to rehydrate her, and follow up on the other possible bacterial      sources.  2.  Gastrointestinal.  Upset secondary to NSAIDs.  Will limit those.  I will      increase her Protonix to IV q.12h.  3.  Genitourinary.  Will monitor I&O's closely and continue to aggressively      hydrate, and we will treat her symptomatically for nausea and vomiting      and check a UA, C&S.  4.  Endocrine.  She has a hypothyroidism.  Will change her Synthroid to IV.      She has hyperglycemia, and will check her CBGs and use sliding scale      insulin if necessary.  5.  Anxiety.  Will continue her medications as an IV Ativan course.  6.  Hyperglycemia.  As stated, will check her and cover her with sliding      scale insulin.      [name], MD  Electronically Signed     MLT/MEDQ  D:  12/01/2005  T:  12/01/2005  Job:  [id number]

## 2011-02-16 NOTE — H&P (Signed)
NAME:  Holly [name], Holly [name]                        ACCOUNT NO.:  [id number]   MEDICAL RECORD NO.:  [id number]                   PATIENT TYPE:  INP   LOCATION:  0359                                 FACILITY:  [facility]   PHYSICIAN:  Sherin Quarry, MD                   DATE OF BIRTH:  Jun 22, 1961   DATE OF ADMISSION:  05/27/2004  DATE OF DISCHARGE:                                HISTORY & PHYSICAL   Holly [name] is a 50 year old lady, who states that she was shopping at a  store tonight at about 7 p.m., when she experienced mid sternal chest  discomfort.  She localizes this to a lower portion of the sternum.  She  states that it radiated to her left arm.  She said she had associated mild  to moderate dyspnea.  She was transported to [facility].  In  the emergency room, she was given a sublingual nitroglycerin.  She said this  seemed to transiently help the pain, but then it seemed to come back.  She  was then given morphine with relief of the pain.  She did not really seem to  be diaphoretic or nauseated.  She is admitted at this time for further  evaluation of chest pain.   PAST MEDICAL HISTORY:   MEDICATIONS:  1. Toprol XL 50 mg.  The patient apparently takes this medication on a     p.r.n. basis.  2. Ativan 1 mg t.i.d. p.r.n. for anxiety.  3. Prevacid 30 mg b.i.d.  4. Pepcid p.r.n.   ALLERGIES:  She is intolerant of PHENERGAN.   MEDICAL ILLNESSES:  1. The patient reportedly has chronic gastroesophageal reflux for which she     takes Prevacid on a b.i.d. schedule.  Apparently in 2003, she had a very     extensive work-up for abdominal pain.  This included upper endoscopy,     January 2003, which was reportedly normal.  A CT scan of abdomen and     pelvis was done which was normal.  Dr. Danise Edge performed upper     endoscopy with small bowel biopsies which I gather was normal and also     did a colonoscopy to the cecum which I gather was normal.  Reports of the     biopsies obtained with these procedures showed normal bowel mucosa.  2. Hyperthyroidism.  The patient is under the care of Dr. Talmage Nap for     management of hyperthyroidism.  Apparently in May of this year, she     received radioiodine.  She is not currently taking any thyroid     suppressive medications.  She says that her pulse will vary significantly     and that she has been instructed to take metoprolol on a p.r.n. basis     when her heart rate is increased.   OPERATIONS:  She has had a previous laparoscopic cholecystectomy.  FAMILY HISTORY:  She has a sister with diabetes.  Her mother had a history  of diabetes.  She has two grandparents who have a history of heart disease.   SOCIAL HISTORY:  She does not smoke.  She denies use of alcohol.  She lives  with her husband and family.   REVIEW OF SYSTEMS:  HEAD:  She denies headache or dizziness.  There is no  history of syncope.  EAR/NOSE/THROAT:  She denies earache, sinus pain, or  sore throat.  CHEST:  She denies coughing wheezing or chest congestion.  CARDIOVASCULAR:  See above.  GI:  See above.  GU:  Denies dysuria, urinary  frequency, hesitancy, or nocturia.  RHEUMATOLOGIC:  Denies back pain or  joint pain.  HEMATOLOGIC:  Denies easy bleeding or bruising.  NEUROLOGIC:  Denies history of seizure or stroke.   PHYSICAL EXAMINATION:  VITAL SIGNS:  Her blood pressure is 111/76.  Pulse is  79, respirations 20.  O2 saturations 99%.  HEENT:  Within normal limits.  CHEST:  Clear to auscultation and percussion.  BACK:  No CVA or point tenderness.  CARDIOVASCULAR:  Normal S1 and S2.  There re no rubs, murmurs, or gallops.  ABDOMEN:  Normal.  Bowel sounds are present.  There is no guarding or  rebound.  There are no masses.  NEUROLOGIC TESTING:  Within normal limits.  EXTREMITIES:  Within normal limits.   EKG is normal.  Enzymes are negative.  D-dimer is normal.  CBC and CMET are  normal.   The patient will be admitted to rule out  MI.  I will take this opportunity  to reassess the patient's thyroid status.  If work-up is negative, the  patient could be scheduled for an outpatient stress test.                                               Sherin Quarry, MD    SY/MEDQ  D:  05/28/2004  T:  05/28/2004  Job:  [id number]   cc:   Meredith Staggers, M.D.  [address]  [place]  [place] [address]  Fax: [phone]   Dorisann Frames, M.D.  [phone] [address] [place] [address]  Fax: [phone]

## 2011-07-13 LAB — HEPATIC FUNCTION PANEL
ALT: 16
Albumin: 3.9
Alkaline Phosphatase: 98
Indirect Bilirubin: 0.5
Total Protein: 6.8

## 2011-07-13 LAB — POCT CARDIAC MARKERS
CKMB, poc: 2.1
CKMB, poc: 2.2
Myoglobin, poc: 72.1
Troponin i, poc: <0.05

## 2011-07-13 LAB — B-NATRIURETIC PEPTIDE (CONVERTED LAB): Pro B Natriuretic peptide (BNP): <30

## 2011-07-13 LAB — HEMOGLOBIN A1C
Hgb A1c MFr Bld: 6
Mean Plasma Glucose: 136

## 2011-07-13 LAB — DIFFERENTIAL
Eosinophils Absolute: 0.1
Lymphs Abs: 2.3
Monocytes Relative: 5
Neutrophils Relative %: 52

## 2011-07-13 LAB — BASIC METABOLIC PANEL
Chloride: 105
Creatinine, Ser: 0.92
GFR calc Af Amer: >60
Potassium: 3.6
Sodium: 141

## 2011-07-13 LAB — CARDIAC PANEL(CRET KIN+CKTOT+MB+TROPI)
CK, MB: 2.1
Relative Index: INVALID
Troponin I: 0.01
Troponin I: 0.02

## 2011-07-13 LAB — CBC
Hemoglobin: 13.7
MCV: 88
RBC: 4.52
WBC: 5.6

## 2011-07-13 LAB — LIPID PANEL
HDL: 28 — ABNORMAL LOW
Total CHOL/HDL Ratio: 7

## 2012-02-06 ENCOUNTER — Other Ambulatory Visit: Payer: Self-pay | Admitting: Family Medicine

## 2012-02-06 DIAGNOSIS — N63 Unspecified lump in unspecified breast: Secondary | ICD-10-CM

## 2012-02-13 ENCOUNTER — Other Ambulatory Visit: Payer: Self-pay

## 2012-06-06 ENCOUNTER — Encounter (INDEPENDENT_AMBULATORY_CARE_PROVIDER_SITE_OTHER): Payer: BC Managed Care – PPO | Admitting: Ophthalmology

## 2012-06-06 DIAGNOSIS — E1165 Type 2 diabetes mellitus with hyperglycemia: Secondary | ICD-10-CM

## 2012-06-06 DIAGNOSIS — H2510 Age-related nuclear cataract, unspecified eye: Secondary | ICD-10-CM

## 2012-06-06 DIAGNOSIS — H43819 Vitreous degeneration, unspecified eye: Secondary | ICD-10-CM

## 2012-06-06 DIAGNOSIS — E1139 Type 2 diabetes mellitus with other diabetic ophthalmic complication: Secondary | ICD-10-CM

## 2012-06-06 DIAGNOSIS — E11319 Type 2 diabetes mellitus with unspecified diabetic retinopathy without macular edema: Secondary | ICD-10-CM

## 2012-10-03 ENCOUNTER — Other Ambulatory Visit: Payer: Self-pay | Admitting: Dermatology

## 2013-01-01 ENCOUNTER — Other Ambulatory Visit: Payer: Self-pay | Admitting: Dermatology

## 2013-01-13 ENCOUNTER — Ambulatory Visit (INDEPENDENT_AMBULATORY_CARE_PROVIDER_SITE_OTHER): Payer: BC Managed Care – PPO | Admitting: Internal Medicine

## 2013-01-13 ENCOUNTER — Other Ambulatory Visit (INDEPENDENT_AMBULATORY_CARE_PROVIDER_SITE_OTHER): Payer: BC Managed Care – PPO

## 2013-01-13 ENCOUNTER — Ambulatory Visit (INDEPENDENT_AMBULATORY_CARE_PROVIDER_SITE_OTHER)
Admission: RE | Admit: 2013-01-13 | Discharge: 2013-01-13 | Disposition: A | Discharge status: Home / Self Care | Payer: BC Managed Care – PPO | Source: Ambulatory Visit | Attending: Internal Medicine | Admitting: Internal Medicine

## 2013-01-13 ENCOUNTER — Encounter: Payer: Self-pay | Admitting: Internal Medicine

## 2013-01-13 VITALS — BP 126/80 | HR 86 | Temp 100.3°F | Ht 65.0 in | Wt 221.2 lb

## 2013-01-13 DIAGNOSIS — R0600 Dyspnea, unspecified: Secondary | ICD-10-CM

## 2013-01-13 DIAGNOSIS — R0609 Other forms of dyspnea: Secondary | ICD-10-CM

## 2013-01-13 DIAGNOSIS — R0989 Other specified symptoms and signs involving the circulatory and respiratory systems: Secondary | ICD-10-CM

## 2013-01-13 DIAGNOSIS — R918 Other nonspecific abnormal finding of lung field: Secondary | ICD-10-CM

## 2013-01-13 LAB — BRAIN NATRIURETIC PEPTIDE: Pro B Natriuretic peptide (BNP): 28 pg/mL (ref 0.0–100.0)

## 2013-01-13 LAB — CBC WITH DIFFERENTIAL/PLATELET
Basophils Relative: 0.9 % (ref 0.0–3.0)
Eosinophils Absolute: 0.3 10*3/uL (ref 0.0–0.7)
Eosinophils Relative: 2.8 % (ref 0.0–5.0)
Lymphocytes Relative: 49.7 % — ABNORMAL HIGH (ref 12.0–46.0)
Monocytes Absolute: 0.4 10*3/uL (ref 0.1–1.0)
Neutrophils Relative %: 42.5 % — ABNORMAL LOW (ref 43.0–77.0)
Platelets: 164 10*3/uL (ref 150.0–400.0)
RBC: 4.96 Mil/uL (ref 3.87–5.11)
WBC: 9.1 10*3/uL (ref 4.5–10.5)

## 2013-01-13 LAB — TSH: TSH: 1.15 u[IU]/mL (ref 0.35–5.50)

## 2013-01-13 LAB — BASIC METABOLIC PANEL
BUN: 11 mg/dL (ref 6–23)
Calcium: 9.3 mg/dL (ref 8.4–10.5)
Creatinine, Ser: 0.7 mg/dL (ref 0.4–1.2)

## 2013-01-13 NOTE — Patient Instructions (Addendum)
Please remember to go to the lab and x-ray department downstairs for your tests - we will call you with the results when they are available.    Please schedule a follow up office visit in 4 weeks, sooner if needed with pfts

## 2013-01-13 NOTE — Assessment & Plan Note (Addendum)
Followed in Pulmonary clinic/ [facility]/ Tyris Eliot  - 01/13/2013  Walked RA x 3 laps @ 185 ft each stopped due to  End of study, no desat, peak HR 99  H/o smoking and modern obesity so likely has some basilar atx in sitting position which improves walking so no desats with ex and very unlikely she has occult pulmonary vasc dz or ild.  Needs pft's to complete the w/u

## 2013-01-13 NOTE — Progress Notes (Signed)
  Subjective:    Patient ID: Holly [name], female    DOB: 1960-10-08 MRN: [id number]  HPI  56 yowf quit smoking 2003 with no respiratory problems at all abruptly ill ?presyncope 2005 admitted [facility] with w/u sign for  dx with two lung nodules and f/u by Edwyna Shell and completely better until pna at [facility] > 100% better again but referred by Dr Katrinka Blazing for abn ct and low 02 sats    01/13/2013 1st pulmonary eval cc acute onset dizzy and sob x 2 months with daily symptoms of feeling funny better with cpap seems to correlate with desats,  Not better when get up and walk, some better even if don't use cpap. Sob is not worse when walk, avoids yardwork or heavy vacuum due to migraines.  No obvious pattern to daytime variabilty or assoc chronic cough or cp or chest tightness, subjective wheeze overt sinus or hb symptoms. No unusual exp hx or h/o childhood pna/ asthma or premature birth to herknowledge.  On cpap sleeping ok without nocturnal  or early am exacerbation  of respiratory  c/o's or need for noct saba. Also denies any obvious fluctuation of symptoms with weather or environmental changes or other aggravating or alleviating factors except as outlined above       Review of Systems  Constitutional: Negative for fever and unexpected weight change.  HENT: Negative for ear pain, nosebleeds, congestion, sore throat, rhinorrhea, sneezing, trouble swallowing, dental problem, postnasal drip and sinus pressure.   Eyes: Negative for redness and itching.  Respiratory: Positive for chest tightness and shortness of breath. Negative for cough and wheezing.   Cardiovascular: Negative for palpitations and leg swelling.  Gastrointestinal: Negative for nausea and vomiting.  Genitourinary: Negative for dysuria.  Musculoskeletal: Negative for joint swelling.  Skin: Negative for rash.  Neurological: Positive for headaches.  Hematological: Does not bruise/bleed easily.  Psychiatric/Behavioral: Positive for dysphoric  mood. The patient is nervous/anxious.        Objective:   Physical Exam  Anxious wf nad Patient failed to answer a single question asked in a straightforward manner, tending to go off on tangents or answer questions with ambiguous medical terms or diagnoses and seemed somewhat perplexed  when asked the same question more than once for clarification.   Wt Readings from Last 3 Encounters:  01/13/13 221 lb 3.2 oz (100.336 kg)   HEENT: nl dentition, turbinates, and orophanx. Nl external ear canals without cough reflex   NECK :  without JVD/Nodes/TM/ nl carotid upstrokes bilaterally   LUNGS: no acc muscle use, clear to A and P bilaterally without cough on insp or exp maneuvers   CV:  RRR  no s3 or murmur or increase in P2, no edema   ABD:  soft and nontender with nl excursion in the supine position. No bruits or organomegaly, bowel sounds nl  MS:  warm without deformities, calf tenderness, cyanosis or clubbing  SKIN: warm and dry without lesions    NEURO:  alert, approp, no deficits    CXR  01/13/2013 :   No edema or consolidation.        Assessment & Plan:

## 2013-01-14 NOTE — Progress Notes (Signed)
Quick Note:  Spoke with pt and notified of results per Dr. Wert. Pt verbalized understanding and denied any questions.  ______ 

## 2013-01-16 DIAGNOSIS — R918 Other nonspecific abnormal finding of lung field: Secondary | ICD-10-CM | POA: Insufficient documentation

## 2013-01-16 NOTE — Assessment & Plan Note (Addendum)
Followed by Dr Edwyna Shell since 2005 no further CT's rec 11/05/08 >  not present on cxr 01/13/13 so no further w/u planned in pulmonary clinic and explained to pt this would not be the cause of any of her present symptoms

## 2013-02-18 ENCOUNTER — Ambulatory Visit (INDEPENDENT_AMBULATORY_CARE_PROVIDER_SITE_OTHER): Payer: BC Managed Care – PPO | Admitting: Internal Medicine

## 2013-02-18 ENCOUNTER — Encounter: Payer: Self-pay | Admitting: Internal Medicine

## 2013-02-18 VITALS — BP 122/72 | HR 104 | Temp 97.9°F | Ht 64.0 in | Wt 220.0 lb

## 2013-02-18 DIAGNOSIS — R0989 Other specified symptoms and signs involving the circulatory and respiratory systems: Secondary | ICD-10-CM

## 2013-02-18 DIAGNOSIS — R0609 Other forms of dyspnea: Secondary | ICD-10-CM

## 2013-02-18 DIAGNOSIS — R918 Other nonspecific abnormal finding of lung field: Secondary | ICD-10-CM

## 2013-02-18 DIAGNOSIS — R0600 Dyspnea, unspecified: Secondary | ICD-10-CM

## 2013-02-18 NOTE — Progress Notes (Signed)
  Subjective:    Patient ID: Holly [name], female    DOB: [birth year] MRN: [id number]  HPI  89 yowf quit smoking 2003 with no respiratory problems at all abruptly ill ?presyncope 2005 admitted [facility] with w/u sign for  dx with two lung nodules and f/u by Edwyna Shell and completely better until pna at [facility] > 100% better again but referred by Dr Katrinka Blazing for abn ct and low 02 sats    01/13/2013 1st pulmonary eval cc acute onset dizzy and sob x 2 months with daily symptoms of feeling funny better with cpap seems to correlate with desats,  Not better when get up and walk, some better even if don't use cpap. Sob is not worse when walk, avoids yardwork or heavy vacuum due to migraines. 02 sats with ex nl. rec No change rx, return for pfts   02/18/2013 f/u ov/Holly [name]  Chief Complaint  Patient presents with  . Followup with PFT    Pt states SOB and cough are unchanged since her last visit, no new co's today.    main concern is drop in sats if sits on couch too long watching TV, takes up to 2 mg of ativan at a time.    No obvious pattern to daytime variabilty or assoc excess mucus production  or cp or chest tightness, subjective wheeze overt sinus or hb symptoms. No unusual exp hx or h/o childhood pna/ asthma or premature birth to herknowledge.  On cpap sleeping ok without nocturnal  or early am exacerbation  of respiratory  c/o's or need for noct saba. Also denies any obvious fluctuation of symptoms with weather or environmental changes or other aggravating or alleviating factors except as outlined above    Current Medications, Allergies, Past Medical History, Past Surgical History, Family History, and Social History were reviewed in [employer] record.  ROS  The following are not active complaints unless bolded sore throat, dysphagia, dental problems, itching, sneezing,  nasal congestion or excess/ purulent secretions, ear ache,   fever, chills, sweats, unintended wt loss, pleuritic or  exertional cp, hemoptysis,  orthopnea pnd or leg swelling, presyncope, palpitations, heartburn, abdominal pain, anorexia, nausea, vomiting, diarrhea  or change in bowel or urinary habits, change in stools or urine, dysuria,hematuria,  rash, arthralgias, visual complaints, headache, numbness weakness or ataxia or problems with walking or coordination,  change in mood/affect or memory.               Objective:   Physical Exam  Anxious amb wf nad  Wt Readings from Last 3 Encounters:  02/18/13 220 lb (99.791 kg)  01/13/13 221 lb 3.2 oz (100.336 kg)     HEENT: nl dentition, turbinates, and orophanx. Nl external ear canals without cough reflex   NECK :  without JVD/Nodes/TM/ nl carotid upstrokes bilaterally   LUNGS: no acc muscle use, clear to A and P bilaterally without cough on insp or exp maneuvers   CV:  RRR  no s3 or murmur or increase in P2, no edema   ABD:  soft and nontender with nl excursion in the supine position. No bruits or organomegaly, bowel sounds nl  MS:  warm without deformities, calf tenderness, cyanosis or clubbing  SKIN: warm and dry without lesions         CXR  01/13/2013 :   No edema or consolidation.        Assessment & Plan:

## 2013-02-18 NOTE — Patient Instructions (Addendum)
Your lung function is normal as is your oxygen level when you walk -   exercise would help your lungs more than anything else  You will need to discuss your low oxygen levels with your pain doctor = [facility] as this may be related to use of medications like ativan  For now, yearly chest xray is   reasonable given your history of lung nodules and is due 12/2013 but this can be done by Dr Katrinka Blazing  Pulmonary follow up is as needed

## 2013-02-18 NOTE — Progress Notes (Signed)
PFT done today. 

## 2013-02-19 NOTE — Assessment & Plan Note (Signed)
-   01/13/2013  Walked RA x 3 laps @ 185 ft each stopped due to  End of study, no desat, peak HR 99 - PFT's 02/18/2013 FEV1  2.92 (103%) and no change p B2 and DLCO 104%   I had an extended summary discussion with the patient and husband  today lasting 15 to 20 minutes of a 25 minute visit on the following issues:  Unable to identify a cause for resting desats that are not produced with exertion x for effects of ativan and obesity on basilar ventilation.  No further pulmonary w/u recommended.  Pain clinic  f/u planned

## 2013-02-19 NOTE — Assessment & Plan Note (Signed)
Followed by Dr Edwyna Shell since 2005 and no further CT's rec 11/05/08   She has already had multiple CT's with lots of radiation to track nodules that cannot be seen on cxr 8 years p they were discovered by CT.  There is no establish standard of care in this situation and all I would recommend for now, knowing that she probably still has multiple microscopic nodules, is a yearly cxr,  Though I told her this nor any serial CT plan was 100% guaranteed to prevent lung ca and the standard for screening may change in the near future.

## 2013-06-08 ENCOUNTER — Ambulatory Visit (INDEPENDENT_AMBULATORY_CARE_PROVIDER_SITE_OTHER): Payer: Self-pay | Admitting: Ophthalmology

## 2013-06-11 ENCOUNTER — Other Ambulatory Visit: Payer: Self-pay | Admitting: Orthopaedic Surgery

## 2013-06-11 DIAGNOSIS — M542 Cervicalgia: Secondary | ICD-10-CM

## 2013-06-11 DIAGNOSIS — R94131 Abnormal electromyogram [EMG]: Secondary | ICD-10-CM

## 2013-06-15 ENCOUNTER — Other Ambulatory Visit: Payer: Self-pay | Admitting: Orthopaedic Surgery

## 2013-06-15 DIAGNOSIS — R94131 Abnormal electromyogram [EMG]: Secondary | ICD-10-CM

## 2013-06-15 DIAGNOSIS — M542 Cervicalgia: Secondary | ICD-10-CM

## 2013-06-18 ENCOUNTER — Ambulatory Visit
Admission: RE | Admit: 2013-06-18 | Discharge: 2013-06-18 | Disposition: A | Discharge status: Home / Self Care | Payer: BC Managed Care – PPO | Source: Ambulatory Visit | Attending: Orthopaedic Surgery | Admitting: Orthopaedic Surgery

## 2013-06-18 DIAGNOSIS — M542 Cervicalgia: Secondary | ICD-10-CM

## 2013-06-18 DIAGNOSIS — R94131 Abnormal electromyogram [EMG]: Secondary | ICD-10-CM

## 2013-11-17 ENCOUNTER — Other Ambulatory Visit: Payer: Self-pay | Admitting: Family Medicine

## 2013-11-17 DIAGNOSIS — M7989 Other specified soft tissue disorders: Secondary | ICD-10-CM

## 2014-01-07 ENCOUNTER — Other Ambulatory Visit: Payer: Self-pay | Admitting: Specialist

## 2014-01-07 DIAGNOSIS — M542 Cervicalgia: Secondary | ICD-10-CM

## 2014-01-12 ENCOUNTER — Other Ambulatory Visit: Payer: Medicare Other

## 2014-01-13 ENCOUNTER — Ambulatory Visit
Admission: RE | Admit: 2014-01-13 | Discharge: 2014-01-13 | Disposition: A | Discharge status: Home / Self Care | Payer: BC Managed Care – PPO | Source: Ambulatory Visit | Attending: Specialist | Admitting: Specialist

## 2014-01-13 DIAGNOSIS — M542 Cervicalgia: Secondary | ICD-10-CM

## 2014-02-23 ENCOUNTER — Ambulatory Visit: Payer: BC Managed Care – PPO | Attending: Orthopedic Surgery

## 2014-02-23 DIAGNOSIS — M542 Cervicalgia: Secondary | ICD-10-CM | POA: Insufficient documentation

## 2014-02-23 DIAGNOSIS — IMO0001 Reserved for inherently not codable concepts without codable children: Secondary | ICD-10-CM | POA: Insufficient documentation

## 2014-02-23 DIAGNOSIS — M2550 Pain in unspecified joint: Secondary | ICD-10-CM | POA: Insufficient documentation

## 2014-02-23 DIAGNOSIS — R293 Abnormal posture: Secondary | ICD-10-CM | POA: Diagnosis not present

## 2014-02-25 ENCOUNTER — Ambulatory Visit: Payer: BC Managed Care – PPO

## 2014-02-25 DIAGNOSIS — IMO0001 Reserved for inherently not codable concepts without codable children: Secondary | ICD-10-CM | POA: Diagnosis not present

## 2014-03-02 ENCOUNTER — Ambulatory Visit: Payer: BC Managed Care – PPO | Attending: Orthopedic Surgery | Admitting: Rehabilitation

## 2014-03-02 DIAGNOSIS — M2550 Pain in unspecified joint: Secondary | ICD-10-CM | POA: Insufficient documentation

## 2014-03-02 DIAGNOSIS — M542 Cervicalgia: Secondary | ICD-10-CM | POA: Insufficient documentation

## 2014-03-02 DIAGNOSIS — Z5189 Encounter for other specified aftercare: Secondary | ICD-10-CM | POA: Insufficient documentation

## 2014-03-02 DIAGNOSIS — R293 Abnormal posture: Secondary | ICD-10-CM | POA: Insufficient documentation

## 2014-03-09 ENCOUNTER — Encounter: Payer: BC Managed Care – PPO | Admitting: Rehabilitation

## 2014-03-11 ENCOUNTER — Encounter: Payer: BC Managed Care – PPO | Admitting: Rehabilitation

## 2014-03-16 ENCOUNTER — Ambulatory Visit: Payer: BC Managed Care – PPO | Admitting: Rehabilitation

## 2014-03-24 ENCOUNTER — Ambulatory Visit: Payer: BC Managed Care – PPO | Admitting: Rehabilitation

## 2014-03-24 DIAGNOSIS — Z5189 Encounter for other specified aftercare: Secondary | ICD-10-CM | POA: Diagnosis not present

## 2014-04-09 ENCOUNTER — Ambulatory Visit: Payer: BC Managed Care – PPO | Attending: Orthopedic Surgery

## 2014-04-09 DIAGNOSIS — M2550 Pain in unspecified joint: Secondary | ICD-10-CM | POA: Insufficient documentation

## 2014-04-09 DIAGNOSIS — M542 Cervicalgia: Secondary | ICD-10-CM | POA: Insufficient documentation

## 2014-04-09 DIAGNOSIS — R293 Abnormal posture: Secondary | ICD-10-CM | POA: Insufficient documentation

## 2014-04-09 DIAGNOSIS — Z5189 Encounter for other specified aftercare: Secondary | ICD-10-CM | POA: Insufficient documentation

## 2014-09-02 ENCOUNTER — Encounter (INDEPENDENT_AMBULATORY_CARE_PROVIDER_SITE_OTHER): Payer: BC Managed Care – PPO | Admitting: Ophthalmology

## 2014-09-02 DIAGNOSIS — E11329 Type 2 diabetes mellitus with mild nonproliferative diabetic retinopathy without macular edema: Secondary | ICD-10-CM

## 2014-09-02 DIAGNOSIS — H43813 Vitreous degeneration, bilateral: Secondary | ICD-10-CM

## 2014-09-02 DIAGNOSIS — E11319 Type 2 diabetes mellitus with unspecified diabetic retinopathy without macular edema: Secondary | ICD-10-CM

## 2014-10-12 ENCOUNTER — Ambulatory Visit
Admission: RE | Admit: 2014-10-12 | Discharge: 2014-10-12 | Disposition: A | Discharge status: Home / Self Care | Payer: BC Managed Care – PPO | Source: Ambulatory Visit | Attending: Family Medicine | Admitting: Family Medicine

## 2014-10-12 DIAGNOSIS — M7989 Other specified soft tissue disorders: Secondary | ICD-10-CM

## 2014-11-18 ENCOUNTER — Ambulatory Visit: Payer: BC Managed Care – PPO | Admitting: Internal Medicine

## 2015-05-20 ENCOUNTER — Other Ambulatory Visit: Payer: Self-pay | Admitting: Family Medicine

## 2015-05-20 ENCOUNTER — Ambulatory Visit
Admission: RE | Admit: 2015-05-20 | Discharge: 2015-05-20 | Disposition: A | Discharge status: Home / Self Care | Payer: BC Managed Care – PPO | Source: Ambulatory Visit | Attending: Family Medicine | Admitting: Family Medicine

## 2015-05-20 DIAGNOSIS — R918 Other nonspecific abnormal finding of lung field: Secondary | ICD-10-CM

## 2015-05-24 ENCOUNTER — Emergency Department (HOSPITAL_COMMUNITY)
Admission: EM | Admit: 2015-05-24 | Discharge: 2015-05-25 | Disposition: A | Discharge status: Home / Self Care | Payer: BC Managed Care – PPO | Attending: Emergency Medicine | Admitting: Emergency Medicine

## 2015-05-24 ENCOUNTER — Encounter (HOSPITAL_COMMUNITY): Payer: Self-pay | Admitting: Emergency Medicine

## 2015-05-24 DIAGNOSIS — E039 Hypothyroidism, unspecified: Secondary | ICD-10-CM | POA: Diagnosis not present

## 2015-05-24 DIAGNOSIS — K219 Gastro-esophageal reflux disease without esophagitis: Secondary | ICD-10-CM | POA: Diagnosis not present

## 2015-05-24 DIAGNOSIS — G43909 Migraine, unspecified, not intractable, without status migrainosus: Secondary | ICD-10-CM | POA: Diagnosis present

## 2015-05-24 DIAGNOSIS — G43009 Migraine without aura, not intractable, without status migrainosus: Secondary | ICD-10-CM

## 2015-05-24 DIAGNOSIS — Z79899 Other long term (current) drug therapy: Secondary | ICD-10-CM | POA: Diagnosis not present

## 2015-05-24 DIAGNOSIS — Z87891 Personal history of nicotine dependence: Secondary | ICD-10-CM | POA: Diagnosis not present

## 2015-05-24 DIAGNOSIS — E119 Type 2 diabetes mellitus without complications: Secondary | ICD-10-CM | POA: Insufficient documentation

## 2015-05-24 MED ORDER — DIPHENHYDRAMINE HCL 50 MG/ML IJ SOLN
25.0000 mg | Freq: Once | INTRAMUSCULAR | Status: AC
  Administered 2015-05-24: 25 mg via INTRAVENOUS
  Filled 2015-05-24: qty 1

## 2015-05-24 MED ORDER — METOCLOPRAMIDE HCL 5 MG/ML IJ SOLN
10.0000 mg | Freq: Once | INTRAMUSCULAR | Status: AC
  Administered 2015-05-24: 10 mg via INTRAVENOUS
  Filled 2015-05-24: qty 2

## 2015-05-24 MED ORDER — KETOROLAC TROMETHAMINE 30 MG/ML IJ SOLN: 30.0000 mg | Freq: Once | INTRAMUSCULAR | Status: DC

## 2015-05-24 MED ORDER — SODIUM CHLORIDE 0.9 % IV SOLN
1000.0000 mL | Freq: Once | INTRAVENOUS | Status: AC
  Administered 2015-05-24: 1000 mL via INTRAVENOUS

## 2015-05-24 MED ORDER — DEXAMETHASONE SODIUM PHOSPHATE 10 MG/ML IJ SOLN
10.0000 mg | Freq: Once | INTRAMUSCULAR | Status: DC
  Filled 2015-05-24: qty 1

## 2015-05-24 MED ORDER — SODIUM CHLORIDE 0.9 % IV SOLN
1000.0000 mL | INTRAVENOUS | Status: DC
  Administered 2015-05-24: 1000 mL via INTRAVENOUS

## 2015-05-24 NOTE — ED Notes (Signed)
Pt is presently on cymboxin to get off of pain medications (for migraines ) (unable to take immitrex as it effects her heart ) however migraine started yesterday --  Pt nauseated and intermittently heaving

## 2015-05-24 NOTE — ED Notes (Signed)
Headache x 2 days. States she sees neurologist in [place] & here in [place].

## 2015-05-24 NOTE — ED Provider Notes (Signed)
CSN: [id number]     Arrival date & time 05/24/15  2144 History  This chart was scribed for Holly Booze, MD by Budd Palmer, ED Scribe. This patient was seen in room APA12/APA12 and the patient's care was started at 10:40 PM.     Chief Complaint  Patient presents with  . Migraine   The history is provided by the patient. No language interpreter was used.   HPI Comments: Holly [name] is a 54 y.o. female who presents to the Emergency Department complaining of a worsening, pounding migraine onset 1 day ago. She rates the pain as above 10/10. She notes associated HA to the entire head, nausea, and visual disturbances. She notes exacerbation of the pain with light. She has taken her prescribed migraine medication (Suboxone) with no relief. She notes she started out at 22 migraines per months, and has since reduced to 12 or less per month. She is allergic to Triptans, as they interfere with her heart, and states she has had kidney problems after taking anti-inflammatories before. Pt denies vomiting.   Past Medical History  Diagnosis Date  . GERD (gastroesophageal reflux disease)   . Hypothyroidism   . Migraine   . Diabetes   . Sleep apnea    Past Surgical History  Procedure Laterality Date  . Thyroidectomy    . Cholecystectomy     Family History  Problem Relation Age of Onset  . COPD Mother   . Cancer Mother   . Heart disease Paternal Grandmother   . Heart disease Paternal Grandfather   . Heart disease Maternal Grandmother   . Heart disease Maternal Grandfather    Social History  Substance Use Topics  . Smoking status: Former Smoker -- 1.00 packs/day for 5 years    Types: Cigarettes    Quit date: 10/01/2001  . Smokeless tobacco: None  . Alcohol Use: No   OB History    No data available     Review of Systems  Eyes: Positive for visual disturbance.  Gastrointestinal: Positive for nausea. Negative for vomiting.  Neurological: Positive for headaches.  All other systems  reviewed and are negative.   Allergies  Triptans  Home Medications   Prior to Admission medications   Medication Sig Start Date End Date Taking? Authorizing Provider  buprenorphine-naloxone (SUBOXONE) 2-0.5 MG SUBL Place 1 tablet under the tongue daily.    Historical Provider, MD  DULoxetine (CYMBALTA) 20 MG capsule Take 20 mg by mouth daily.    Historical Provider, MD  lansoprazole (PREVACID) 30 MG capsule Take 30 mg by mouth 2 (two) times daily.    Historical Provider, MD  levothyroxine (SYNTHROID, LEVOTHROID) 112 MCG tablet Take 112 mcg by mouth daily before breakfast.    Historical Provider, MD  LORazepam (ATIVAN) 2 MG tablet Take 2 mg by mouth 4 (four) times daily.    Historical Provider, MD  metFORMIN (GLUCOPHAGE) 500 MG tablet Take 500 mg by mouth daily.     Historical Provider, MD   BP 130/86 mmHg  Pulse 88  Temp(Src) 97.8 F (36.6 C) (Oral)  Resp 18  Ht 5\' 5"  (1.651 m)  Wt 209 lb (94.802 kg)  BMI 34.78 kg/m2  SpO2 97% Physical Exam  Constitutional: She is oriented to person, place, and time. She appears well-developed and well-nourished.  HENT:  Head: Normocephalic and atraumatic.  Eyes: EOM are normal. Pupils are equal, round, and reactive to light. Right eye exhibits no discharge. Left eye exhibits no discharge.  Fundi are normal  Neck: Normal range of motion. Neck supple. No JVD present.  Cardiovascular: Normal rate, regular rhythm and normal heart sounds.   No murmur heard. Pulmonary/Chest: Effort normal and breath sounds normal. She has no wheezes. She has no rales. She exhibits no tenderness.  Abdominal: Soft. Bowel sounds are normal. She exhibits no distension and no mass. There is no tenderness.  Musculoskeletal: Normal range of motion. She exhibits no edema.  Lymphadenopathy:    She has no cervical adenopathy.  Neurological: She is alert and oriented to person, place, and time. No cranial nerve deficit. She exhibits normal muscle tone. Coordination normal.   Skin: Skin is warm and dry. No rash noted. She is not diaphoretic.  Psychiatric: She has a normal mood and affect. Judgment and thought content normal.  Nursing note and vitals reviewed.   ED Course  Procedures  DIAGNOSTIC STUDIES: Oxygen Saturation is 97% on RA, adequate by my interpretation.    COORDINATION OF CARE: 10:47 PM - Discussed plans to order a migraine cocktail. Pt advised of plan for treatment and pt agrees.   MDM   Final diagnoses:  Migraine without aura and without status migrainosus, not intractable    Headache consistent with migraine. Old records are reviewed, and I do not see any prior ED visits for migraines. She is given a migraine cocktail of normal saline, metoclopramide, diphenhydramine and dexamethasone. She developed some flushing while getting dexamethasone, so she did not get the full dose. She had good, but not complete relief of her headache, and is discharged with a prescription for metoclopramide.  I personally performed the services described in this documentation, which was scribed in my presence. The recorded information has been reviewed and is accurate.     Holly Booze, MD 05/25/15 [phone]

## 2015-05-25 MED ORDER — METOCLOPRAMIDE HCL 10 MG PO TABS: 10.0000 mg | ORAL_TABLET | Freq: Four times a day (QID) | ORAL | Status: DC | PRN

## 2015-05-25 NOTE — Discharge Instructions (Signed)
Migraine Headache A migraine headache is an intense, throbbing pain on one or both sides of your head. A migraine can last for 30 minutes to several hours. CAUSES  The exact cause of a migraine headache is not always known. However, a migraine may be caused when nerves in the brain become irritated and release chemicals that cause inflammation. This causes pain. Certain things may also trigger migraines, such as:  Alcohol.  Smoking.  Stress.  Menstruation.  Aged cheeses.  Foods or drinks that contain nitrates, glutamate, aspartame, or tyramine.  Lack of sleep.  Chocolate.  Caffeine.  Hunger.  Physical exertion.  Fatigue.  Medicines used to treat chest pain (nitroglycerine), birth control pills, estrogen, and some blood pressure medicines. SIGNS AND SYMPTOMS  Pain on one or both sides of your head.  Pulsating or throbbing pain.  Severe pain that prevents daily activities.  Pain that is aggravated by any physical activity.  Nausea, vomiting, or both.  Dizziness.  Pain with exposure to bright lights, loud noises, or activity.  General sensitivity to bright lights, loud noises, or smells. Before you get a migraine, you may get warning signs that a migraine is coming (aura). An aura may include:  Seeing flashing lights.  Seeing bright spots, halos, or zigzag lines.  Having tunnel vision or blurred vision.  Having feelings of numbness or tingling.  Having trouble talking.  Having muscle weakness. DIAGNOSIS  A migraine headache is often diagnosed based on:  Symptoms.  Physical exam.  A CT scan or MRI of your head. These imaging tests cannot diagnose migraines, but they can help rule out other causes of headaches. TREATMENT Medicines may be given for pain and nausea. Medicines can also be given to help prevent recurrent migraines.  HOME CARE INSTRUCTIONS  Only take over-the-counter or prescription medicines for pain or discomfort as directed by your  health care provider. The use of long-term narcotics is not recommended.  Lie down in a dark, quiet room when you have a migraine.  Keep a journal to find out what may trigger your migraine headaches. For example, write down:  What you eat and drink.  How much sleep you get.  Any change to your diet or medicines.  Limit alcohol consumption.  Quit smoking if you smoke.  Get 7-9 hours of sleep, or as recommended by your health care provider.  Limit stress.  Keep lights dim if bright lights bother you and make your migraines worse. SEEK IMMEDIATE MEDICAL CARE IF:   Your migraine becomes severe.  You have a fever.  You have a stiff neck.  You have vision loss.  You have muscular weakness or loss of muscle control.  You start losing your balance or have trouble walking.  You feel faint or pass out.  You have severe symptoms that are different from your first symptoms. MAKE SURE YOU:   Understand these instructions.  Will watch your condition.  Will get help right away if you are not doing well or get worse. Document Released: 09/17/2005 Document Revised: 02/01/2014 Document Reviewed: 05/25/2013 [facility] Patient Information 2015 [place], [place]. This information is not intended to replace advice given to you by your health care provider. Make sure you discuss any questions you have with your health care provider.  Metoclopramide tablets What is this medicine? METOCLOPRAMIDE (met oh kloe PRA mide) is used to treat the symptoms of gastroesophageal reflux disease (GERD) like heartburn. It is also used to treat people with slow emptying of the stomach and  intestinal tract. This medicine may be used for other purposes; ask your health care provider or pharmacist if you have questions. COMMON BRAND NAME(S): Reglan What should I tell my health care provider before I take this medicine? They need to know if you have any of these conditions: -breast  cancer -depression -diabetes -heart failure -high blood pressure -kidney disease -liver disease -Parkinson's disease or a movement disorder -pheochromocytoma -seizures -stomach obstruction, bleeding, or perforation -an unusual or allergic reaction to metoclopramide, procainamide, sulfites, other medicines, foods, dyes, or preservatives -pregnant or trying to get pregnant -breast-feeding How should I use this medicine? Take this medicine by mouth with a glass of water. Follow the directions on the prescription label. Take this medicine on an empty stomach, about 30 minutes before eating. Take your doses at regular intervals. Do not take your medicine more often than directed. Do not stop taking except on the advice of your doctor or health care professional. A special MedGuide will be given to you by the pharmacist with each prescription and refill. Be sure to read this information carefully each time. Talk to your pediatrician regarding the use of this medicine in children. Special care may be needed. Overdosage: If you think you have taken too much of this medicine contact a poison control center or emergency room at once. NOTE: This medicine is only for you. Do not share this medicine with others. What if I miss a dose? If you miss a dose, take it as soon as you can. If it is almost time for your next dose, take only that dose. Do not take double or extra doses. What may interact with this medicine? -acetaminophen -cyclosporine -digoxin -medicines for blood pressure -medicines for diabetes, including insulin -medicines for hay fever and other allergies -medicines for depression, especially an Monoamine Oxidase Inhibitor (MAOI) -medicines for Parkinson's disease, like levodopa -medicines for sleep or for pain -tetracycline This list may not describe all possible interactions. Give your health care provider a list of all the medicines, herbs, non-prescription drugs, or dietary  supplements you use. Also tell them if you smoke, drink alcohol, or use illegal drugs. Some items may interact with your medicine. What should I watch for while using this medicine? It may take a few weeks for your stomach condition to start to get better. However, do not take this medicine for longer than 12 weeks. The longer you take this medicine, and the more you take it, the greater your chances are of developing serious side effects. If you are an elderly patient, a female patient, or you have diabetes, you may be at an increased risk for side effects from this medicine. Contact your doctor immediately if you start having movements you cannot control such as lip smacking, rapid movements of the tongue, involuntary or uncontrollable movements of the eyes, head, arms and legs, or muscle twitches and spasms. Patients and their families should watch out for worsening depression or thoughts of suicide. Also watch out for any sudden or severe changes in feelings such as feeling anxious, agitated, panicky, irritable, hostile, aggressive, impulsive, severely restless, overly excited and hyperactive, or not being able to sleep. If this happens, especially at the beginning of treatment or after a change in dose, call your doctor. Do not treat yourself for high fever. Ask your doctor or health care professional for advice. You may get drowsy or dizzy. Do not drive, use machinery, or do anything that needs mental alertness until you know how this drug affects you.  Do not stand or sit up quickly, especially if you are an older patient. This reduces the risk of dizzy or fainting spells. Alcohol can make you more drowsy and dizzy. Avoid alcoholic drinks. What side effects may I notice from receiving this medicine? Side effects that you should report to your doctor or health care professional as soon as possible: -allergic reactions like skin rash, itching or hives, swelling of the face, lips, or tongue -abnormal  production of milk in females -breast enlargement in both males and females -change in the way you walk -difficulty moving, speaking or swallowing -drooling, lip smacking, or rapid movements of the tongue -excessive sweating -fever -involuntary or uncontrollable movements of the eyes, head, arms and legs -irregular heartbeat or palpitations -muscle twitches and spasms -unusually weak or tired Side effects that usually do not require medical attention (report to your doctor or health care professional if they continue or are bothersome): -change in sex drive or performance -depressed mood -diarrhea -difficulty sleeping -headache -menstrual changes -restless or nervous This list may not describe all possible side effects. Call your doctor for medical advice about side effects. You may report side effects to FDA at 1-800-FDA-1088. Where should I keep my medicine? Keep out of the reach of children. Store at room temperature between 20 and 25 degrees C (68 and 77 degrees F). Protect from light. Keep container tightly closed. Throw away any unused medicine after the expiration date. NOTE: This sheet is a summary. It may not cover all possible information. If you have questions about this medicine, talk to your doctor, pharmacist, or health care provider.  2015, [employer] Standard. (2012-01-15 13:04:38)

## 2015-05-25 NOTE — ED Notes (Signed)
Pt alert & oriented x4, stable gait. Patient given discharge instructions, paperwork & prescription(s). Patient  instructed to stop at the registration desk to finish any additional paperwork. Patient verbalized understanding. Pt left department w/ no further questions. 

## 2015-08-18 ENCOUNTER — Other Ambulatory Visit (HOSPITAL_COMMUNITY): Payer: Self-pay | Admitting: Specialist

## 2015-08-29 ENCOUNTER — Encounter (HOSPITAL_COMMUNITY): Payer: Self-pay

## 2015-08-29 ENCOUNTER — Encounter (HOSPITAL_COMMUNITY)
Admission: RE | Admit: 2015-08-29 | Discharge: 2015-08-29 | Disposition: A | Discharge status: Home / Self Care | Payer: BC Managed Care – PPO | Source: Ambulatory Visit | Attending: Specialist | Admitting: Specialist

## 2015-08-29 ENCOUNTER — Other Ambulatory Visit: Payer: Self-pay

## 2015-08-29 ENCOUNTER — Ambulatory Visit (HOSPITAL_COMMUNITY)
Admission: RE | Admit: 2015-08-29 | Discharge: 2015-08-29 | Disposition: A | Discharge status: Home / Self Care | Payer: BC Managed Care – PPO | Source: Ambulatory Visit | Attending: Anesthesiology | Admitting: Anesthesiology

## 2015-08-29 DIAGNOSIS — R911 Solitary pulmonary nodule: Secondary | ICD-10-CM | POA: Insufficient documentation

## 2015-08-29 DIAGNOSIS — Z01810 Encounter for preprocedural cardiovascular examination: Secondary | ICD-10-CM | POA: Insufficient documentation

## 2015-08-29 DIAGNOSIS — Z01812 Encounter for preprocedural laboratory examination: Secondary | ICD-10-CM | POA: Insufficient documentation

## 2015-08-29 DIAGNOSIS — Z01818 Encounter for other preprocedural examination: Secondary | ICD-10-CM | POA: Insufficient documentation

## 2015-08-29 HISTORY — DX: Enthesopathy, unspecified: M77.9

## 2015-08-29 HISTORY — DX: Diverticulosis of intestine, part unspecified, without perforation or abscess without bleeding: K57.90

## 2015-08-29 HISTORY — DX: Dorsalgia, unspecified: M54.9

## 2015-08-29 HISTORY — DX: Major depressive disorder, single episode, unspecified: F32.9

## 2015-08-29 HISTORY — DX: Nocturia: R35.1

## 2015-08-29 HISTORY — DX: Anxiety disorder, unspecified: F41.9

## 2015-08-29 HISTORY — DX: Personal history of other diseases of the digestive system: Z87.19

## 2015-08-29 HISTORY — DX: Urgency of urination: R39.15

## 2015-08-29 HISTORY — DX: Pain in unspecified joint: M25.50

## 2015-08-29 HISTORY — DX: Anesthesia of skin: R20.2

## 2015-08-29 HISTORY — DX: Solitary pulmonary nodule: R91.1

## 2015-08-29 HISTORY — DX: Brachial plexus disorders: G54.0

## 2015-08-29 HISTORY — DX: Depression, unspecified: F32.A

## 2015-08-29 HISTORY — DX: Personal history of other diseases of the respiratory system: Z87.09

## 2015-08-29 HISTORY — DX: Irritable bowel syndrome, unspecified: K58.9

## 2015-08-29 HISTORY — DX: Insomnia, unspecified: G47.00

## 2015-08-29 HISTORY — DX: Anesthesia of skin: R20.0

## 2015-08-29 LAB — CBC
HEMATOCRIT: 40.1 % (ref 36.0–46.0)
Hemoglobin: 14.6 g/dL (ref 12.0–15.0)
MCH: 30.4 pg (ref 26.0–34.0)
MCHC: 36.4 g/dL — AB (ref 30.0–36.0)
MCV: 83.4 fL (ref 78.0–100.0)
Platelets: 133 10*3/uL — ABNORMAL LOW (ref 150–400)
RBC: 4.81 MIL/uL (ref 3.87–5.11)
RDW: 12.7 % (ref 11.5–15.5)
WBC: 6.6 10*3/uL (ref 4.0–10.5)

## 2015-08-29 LAB — BASIC METABOLIC PANEL
Anion gap: 8 (ref 5–15)
BUN: 9 mg/dL (ref 6–20)
CHLORIDE: 103 mmol/L (ref 101–111)
CO2: 27 mmol/L (ref 22–32)
Calcium: 9.6 mg/dL (ref 8.9–10.3)
Creatinine, Ser: 0.66 mg/dL (ref 0.44–1.00)
GFR calc Af Amer: >60 mL/min (ref >60)
GFR calc non Af Amer: >60 mL/min (ref >60)
GLUCOSE: 156 mg/dL — AB (ref 65–99)
POTASSIUM: 4.2 mmol/L (ref 3.5–5.1)
Sodium: 138 mmol/L (ref 135–145)

## 2015-08-29 MED ORDER — CHLORHEXIDINE GLUCONATE 4 % EX LIQD: 60.0000 mL | Freq: Once | CUTANEOUS | Status: DC

## 2015-08-29 NOTE — Progress Notes (Addendum)
Cardiologist denies having one  Medical Md is Dr.Candace Katrinka BlazingSmith  Echo/stress test/heart cath denies ever having   EKG/CXR denies in past yr

## 2015-08-29 NOTE — Pre-Procedure Instructions (Signed)
Holly CrankerDonna M [name]  08/29/2015      [facility] [place] - [address] [place] [address] Phone: [phone] Fax: [phone]    Your procedure is scheduled on Mon, Dec 5 @ 10:30 AM  [facility] at 8:30 AM  Call this number if you have problems the morning of surgery:  [phone]   Remember:  Do not eat food or drink liquids after midnight.  Take these medicines the morning of surgery with A SIP OF WATER Cymbalta(Duloxetine),Prevacid(Lansoprazole),Synthroid(Levothyroxine),Ativan(Lorazepam),Reglan(Metoclopramide-if needed),and Pain Pill(if needed)               No Goody's,BC's,Aleve,Aspirin,Ibuprofen,Motrin,Advil,Fish Oil,or any Herbal Medications.    Do not wear jewelry, make-up or nail polish.  Do not wear lotions, powders, or perfumes.  You may wear deodorant.  Do not shave 48 hours prior to surgery.    Do not bring valuables to the hospital.  [facility] any belongings or valuables.  Contacts, dentures or bridgework may not be worn into surgery.  Leave your suitcase in the car.  After surgery it may be brought to your room.  For patients admitted to the hospital, discharge time will be determined by your treatment team.  Patients discharged the day of surgery will not be allowed to drive home.    Special instructions:  [place] - Preparing for Surgery  Before surgery, you can play an important role.  Because skin is not sterile, your skin needs to be as free of germs as possible.  You can reduce the number of germs on you skin by washing with CHG (chlorahexidine gluconate) soap before surgery.  CHG is an antiseptic cleaner which kills germs and bonds with the skin to continue killing germs even after washing.  Please DO NOT use if you have an allergy to CHG or antibacterial soaps.  If your skin becomes reddened/irritated stop using the CHG and inform  your nurse when you arrive at Short Stay.  Do not shave (including legs and underarms) for at least 48 hours prior to the first CHG shower.  You may shave your face.  Please follow these instructions carefully:   1.  Shower with CHG Soap the night before surgery and the                                morning of Surgery.  2.  If you choose to wash your hair, wash your hair first as usual with your       normal shampoo.  3.  After you shampoo, rinse your hair and body thoroughly to remove the                      Shampoo.  4.  Use CHG as you would any other liquid soap.  You can apply chg directly       to the skin and wash gently with scrungie or a clean washcloth.  5.  Apply the CHG Soap to your body ONLY FROM THE NECK DOWN.        Do not use on open wounds or open sores.  Avoid contact with your eyes,       ears, mouth and genitals (private parts).  Wash genitals (private parts)       with your normal soap.  6.  Wash thoroughly,  paying special attention to the area where your surgery        will be performed.  7.  Thoroughly rinse your body with warm water from the neck down.  8.  DO NOT shower/wash with your normal soap after using and rinsing off       the CHG Soap.  9.  Pat yourself dry with a clean towel.            10.  Wear clean pajamas.            11.  Place clean sheets on your bed the night of your first shower and do not        sleep with pets.  Day of Surgery  Do not apply any lotions/deoderants the morning of surgery.  Please wear clean clothes to the hospital/surgery center.    Please read over the following fact sheets that you were given. Pain Booklet, Coughing and Deep Breathing and Surgical Site Infection Prevention

## 2015-08-31 LAB — HEMOGLOBIN A1C
Hgb A1c MFr Bld: 7.1 % — ABNORMAL HIGH (ref 4.8–5.6)
Mean Plasma Glucose: 157 mg/dL

## 2015-09-01 ENCOUNTER — Other Ambulatory Visit (HOSPITAL_COMMUNITY): Payer: Self-pay | Admitting: Specialist

## 2015-09-05 ENCOUNTER — Ambulatory Visit (HOSPITAL_COMMUNITY): Payer: BC Managed Care – PPO | Admitting: Certified Registered Nurse Anesthetist

## 2015-09-05 ENCOUNTER — Encounter (HOSPITAL_COMMUNITY)
Admission: RE | Disposition: A | Discharge status: Home / Self Care | Payer: Self-pay | Source: Ambulatory Visit | Attending: Specialist

## 2015-09-05 ENCOUNTER — Ambulatory Visit (INDEPENDENT_AMBULATORY_CARE_PROVIDER_SITE_OTHER): Payer: BC Managed Care – PPO | Admitting: Ophthalmology

## 2015-09-05 ENCOUNTER — Ambulatory Visit (HOSPITAL_COMMUNITY)
Admission: RE | Admit: 2015-09-05 | Discharge: 2015-09-05 | Disposition: A | Discharge status: Home / Self Care | Payer: BC Managed Care – PPO | Source: Ambulatory Visit | Attending: Specialist | Admitting: Specialist

## 2015-09-05 ENCOUNTER — Encounter (HOSPITAL_COMMUNITY): Payer: Self-pay | Admitting: Certified Registered Nurse Anesthetist

## 2015-09-05 ENCOUNTER — Ambulatory Visit (HOSPITAL_COMMUNITY): Payer: BC Managed Care – PPO | Admitting: Vascular Surgery

## 2015-09-05 DIAGNOSIS — M65321 Trigger finger, right index finger: Secondary | ICD-10-CM | POA: Insufficient documentation

## 2015-09-05 DIAGNOSIS — M65332 Trigger finger, left middle finger: Secondary | ICD-10-CM | POA: Diagnosis present

## 2015-09-05 DIAGNOSIS — F329 Major depressive disorder, single episode, unspecified: Secondary | ICD-10-CM | POA: Insufficient documentation

## 2015-09-05 DIAGNOSIS — F419 Anxiety disorder, unspecified: Secondary | ICD-10-CM | POA: Insufficient documentation

## 2015-09-05 DIAGNOSIS — E119 Type 2 diabetes mellitus without complications: Secondary | ICD-10-CM | POA: Insufficient documentation

## 2015-09-05 DIAGNOSIS — G5601 Carpal tunnel syndrome, right upper limb: Secondary | ICD-10-CM | POA: Diagnosis present

## 2015-09-05 DIAGNOSIS — E039 Hypothyroidism, unspecified: Secondary | ICD-10-CM | POA: Insufficient documentation

## 2015-09-05 DIAGNOSIS — K219 Gastro-esophageal reflux disease without esophagitis: Secondary | ICD-10-CM | POA: Insufficient documentation

## 2015-09-05 DIAGNOSIS — M6530 Trigger finger, unspecified finger: Secondary | ICD-10-CM | POA: Diagnosis present

## 2015-09-05 HISTORY — PX: CARPAL TUNNEL RELEASE: SHX101

## 2015-09-05 HISTORY — PX: TRIGGER FINGER RELEASE: SHX641

## 2015-09-05 LAB — GLUCOSE, CAPILLARY
GLUCOSE-CAPILLARY: 178 mg/dL — AB (ref 65–99)
GLUCOSE-CAPILLARY: 135 mg/dL — AB (ref 65–99)

## 2015-09-05 SURGERY — CARPAL TUNNEL RELEASE
Anesthesia: Monitor Anesthesia Care | Site: Hand | Laterality: Right

## 2015-09-05 MED ORDER — GLYCOPYRROLATE 0.2 MG/ML IJ SOLN
INTRAMUSCULAR | Status: DC | PRN
  Administered 2015-09-05 (×2): 0.1 mg via INTRAVENOUS

## 2015-09-05 MED ORDER — FENTANYL CITRATE (PF) 100 MCG/2ML IJ SOLN
100.0000 ug | Freq: Once | INTRAMUSCULAR | Status: AC
  Administered 2015-09-05: 100 ug via INTRAVENOUS

## 2015-09-05 MED ORDER — MEPERIDINE HCL 25 MG/ML IJ SOLN: 6.2500 mg | INTRAMUSCULAR | Status: DC | PRN

## 2015-09-05 MED ORDER — CEFAZOLIN SODIUM-DEXTROSE 2-3 GM-% IV SOLR
INTRAVENOUS | Status: AC
  Filled 2015-09-05: qty 50

## 2015-09-05 MED ORDER — OXYCODONE-ACETAMINOPHEN 5-325 MG PO TABS
ORAL_TABLET | ORAL | Status: AC
  Filled 2015-09-05: qty 1

## 2015-09-05 MED ORDER — ONDANSETRON HCL 4 MG/2ML IJ SOLN: 4.0000 mg | Freq: Once | INTRAMUSCULAR | Status: DC | PRN

## 2015-09-05 MED ORDER — FENTANYL CITRATE (PF) 250 MCG/5ML IJ SOLN
INTRAMUSCULAR | Status: DC | PRN
  Administered 2015-09-05 (×5): 50 ug via INTRAVENOUS

## 2015-09-05 MED ORDER — MIDAZOLAM HCL 2 MG/2ML IJ SOLN
INTRAMUSCULAR | Status: DC | PRN
  Administered 2015-09-05: 2 mg via INTRAVENOUS

## 2015-09-05 MED ORDER — OXYCODONE-ACETAMINOPHEN 7.5-325 MG PO TABS: 1.0000 | ORAL_TABLET | ORAL | Status: DC | PRN

## 2015-09-05 MED ORDER — PROPOFOL 500 MG/50ML IV EMUL
INTRAVENOUS | Status: DC | PRN
  Administered 2015-09-05: 50 ug/kg/min via INTRAVENOUS

## 2015-09-05 MED ORDER — FENTANYL CITRATE (PF) 250 MCG/5ML IJ SOLN
INTRAMUSCULAR | Status: AC
  Filled 2015-09-05: qty 5

## 2015-09-05 MED ORDER — HYDROMORPHONE HCL 1 MG/ML IJ SOLN
INTRAMUSCULAR | Status: AC
  Filled 2015-09-05: qty 1

## 2015-09-05 MED ORDER — MIDAZOLAM HCL 2 MG/2ML IJ SOLN
2.0000 mg | Freq: Once | INTRAMUSCULAR | Status: AC
  Administered 2015-09-05: 2 mg via INTRAVENOUS

## 2015-09-05 MED ORDER — HYDROMORPHONE HCL 1 MG/ML IJ SOLN
0.2500 mg | INTRAMUSCULAR | Status: DC | PRN
  Administered 2015-09-05 (×3): 0.5 mg via INTRAVENOUS

## 2015-09-05 MED ORDER — ONDANSETRON HCL 4 MG/2ML IJ SOLN
INTRAMUSCULAR | Status: DC | PRN
  Administered 2015-09-05: 4 mg via INTRAVENOUS

## 2015-09-05 MED ORDER — MIDAZOLAM HCL 2 MG/2ML IJ SOLN
INTRAMUSCULAR | Status: AC
  Administered 2015-09-05: 2 mg via INTRAVENOUS
  Filled 2015-09-05: qty 2

## 2015-09-05 MED ORDER — CEFAZOLIN SODIUM-DEXTROSE 2-3 GM-% IV SOLR
INTRAVENOUS | Status: DC | PRN
  Administered 2015-09-05: 2 g via INTRAVENOUS

## 2015-09-05 MED ORDER — GLYCOPYRROLATE 0.2 MG/ML IJ SOLN
INTRAMUSCULAR | Status: AC
  Filled 2015-09-05: qty 1

## 2015-09-05 MED ORDER — ONDANSETRON HCL 4 MG/2ML IJ SOLN
INTRAMUSCULAR | Status: AC
  Filled 2015-09-05: qty 2

## 2015-09-05 MED ORDER — 0.9 % SODIUM CHLORIDE (POUR BTL) OPTIME
TOPICAL | Status: DC | PRN
  Administered 2015-09-05: 1000 mL

## 2015-09-05 MED ORDER — LIDOCAINE HCL (CARDIAC) 20 MG/ML IV SOLN
INTRAVENOUS | Status: DC | PRN
  Administered 2015-09-05: 100 mg via INTRAVENOUS

## 2015-09-05 MED ORDER — LIDOCAINE HCL (PF) 0.5 % IJ SOLN
INTRAMUSCULAR | Status: AC
  Filled 2015-09-05: qty 50

## 2015-09-05 MED ORDER — BUPIVACAINE HCL (PF) 0.25 % IJ SOLN
INTRAMUSCULAR | Status: AC
  Filled 2015-09-05: qty 30

## 2015-09-05 MED ORDER — BUPIVACAINE HCL (PF) 0.25 % IJ SOLN
INTRAMUSCULAR | Status: DC | PRN
Start: 2015-09-05 — End: 2015-09-05
  Administered 2015-09-05: 30 mL

## 2015-09-05 MED ORDER — MIDAZOLAM HCL 2 MG/2ML IJ SOLN
INTRAMUSCULAR | Status: AC
  Filled 2015-09-05: qty 2

## 2015-09-05 MED ORDER — FENTANYL CITRATE (PF) 100 MCG/2ML IJ SOLN
INTRAMUSCULAR | Status: AC
  Administered 2015-09-05: 100 ug via INTRAVENOUS
  Filled 2015-09-05: qty 2

## 2015-09-05 MED ORDER — OXYCODONE-ACETAMINOPHEN 5-325 MG PO TABS
1.0000 | ORAL_TABLET | ORAL | Status: DC | PRN
  Administered 2015-09-05: 1 via ORAL

## 2015-09-05 MED ORDER — LACTATED RINGERS IV SOLN
INTRAVENOUS | Status: DC
  Administered 2015-09-05 (×2): via INTRAVENOUS

## 2015-09-05 MED ORDER — HYDROMORPHONE HCL 1 MG/ML IJ SOLN
INTRAMUSCULAR | Status: AC
  Administered 2015-09-05: 0.5 mg via INTRAVENOUS
  Filled 2015-09-05: qty 1

## 2015-09-05 MED ORDER — LIDOCAINE HCL (CARDIAC) 20 MG/ML IV SOLN
INTRAVENOUS | Status: AC
  Filled 2015-09-05: qty 5

## 2015-09-05 SURGICAL SUPPLY — 41 items
BANDAGE ELASTIC 3 VELCRO ST LF (GAUZE/BANDAGES/DRESSINGS) ×3 IMPLANT
BNDG ESMARK 4X9 LF (GAUZE/BANDAGES/DRESSINGS) ×3 IMPLANT
BNDG GAUZE ELAST 4 BULKY (GAUZE/BANDAGES/DRESSINGS) ×3 IMPLANT
COVER SURGICAL LIGHT HANDLE (MISCELLANEOUS) ×3 IMPLANT
CUFF TOURNIQUET SINGLE 18IN (TOURNIQUET CUFF) ×3 IMPLANT
CUFF TOURNIQUET SINGLE 24IN (TOURNIQUET CUFF) IMPLANT
DRAPE U-SHAPE 47X51 STRL (DRAPES) ×3 IMPLANT
DRSG EMULSION OIL 3X3 NADH (GAUZE/BANDAGES/DRESSINGS) ×3 IMPLANT
DURAPREP 26ML APPLICATOR (WOUND CARE) ×3 IMPLANT
ELECT REM PT RETURN 9FT ADLT (ELECTROSURGICAL) ×3
ELECTRODE REM PT RTRN 9FT ADLT (ELECTROSURGICAL) ×2 IMPLANT
GAUZE SPONGE 4X4 12PLY STRL (GAUZE/BANDAGES/DRESSINGS) ×3 IMPLANT
GAUZE XEROFORM 1X8 LF (GAUZE/BANDAGES/DRESSINGS) ×3 IMPLANT
GLOVE BIOGEL PI IND STRL 8 (GLOVE) ×2 IMPLANT
GLOVE BIOGEL PI INDICATOR 8 (GLOVE) ×1
GLOVE ECLIPSE 9.0 STRL (GLOVE) ×3 IMPLANT
GLOVE ORTHO TXT STRL SZ7.5 (GLOVE) ×3 IMPLANT
GLOVE SURG 8.5 LATEX PF (GLOVE) ×3 IMPLANT
GOWN STRL REUS W/ TWL LRG LVL3 (GOWN DISPOSABLE) ×2 IMPLANT
GOWN STRL REUS W/TWL 2XL LVL3 (GOWN DISPOSABLE) ×6 IMPLANT
GOWN STRL REUS W/TWL LRG LVL3 (GOWN DISPOSABLE) ×1
KIT BASIN OR (CUSTOM PROCEDURE TRAY) ×3 IMPLANT
KIT ROOM TURNOVER OR (KITS) ×3 IMPLANT
NEEDLE HYPO 25GX1X1/2 BEV (NEEDLE) ×3 IMPLANT
NS IRRIG 1000ML POUR BTL (IV SOLUTION) ×3 IMPLANT
PACK ORTHO EXTREMITY (CUSTOM PROCEDURE TRAY) ×3 IMPLANT
PAD ARMBOARD 7.5X6 YLW CONV (MISCELLANEOUS) ×6 IMPLANT
PAD CAST 4YDX4 CTTN HI CHSV (CAST SUPPLIES) ×2 IMPLANT
PADDING CAST COTTON 4X4 STRL (CAST SUPPLIES) ×1
SPONGE LAP 4X18 X RAY DECT (DISPOSABLE) IMPLANT
STRIP CLOSURE SKIN 1/2X4 (GAUZE/BANDAGES/DRESSINGS) ×3 IMPLANT
SUT ETHILON 4 0 PS 2 18 (SUTURE) IMPLANT
SUT PROLENE 3 0 PS 2 (SUTURE) IMPLANT
SUT VIC AB 3-0 PS2 18 (SUTURE)
SUT VIC AB 3-0 PS2 18XBRD (SUTURE) IMPLANT
SYR CONTROL 10ML LL (SYRINGE) ×3 IMPLANT
TOWEL OR 17X24 6PK STRL BLUE (TOWEL DISPOSABLE) ×3 IMPLANT
TOWEL OR 17X26 10 PK STRL BLUE (TOWEL DISPOSABLE) ×3 IMPLANT
TUBE CONNECTING 12X1/4 (SUCTIONS) ×3 IMPLANT
UNDERPAD 30X30 INCONTINENT (UNDERPADS AND DIAPERS) ×3 IMPLANT
WATER STERILE IRR 1000ML POUR (IV SOLUTION) ×3 IMPLANT

## 2015-09-05 NOTE — Transfer of Care (Signed)
Immediate Anesthesia Transfer of Care Note  Patient: Holly CrankerDonna M [name]  Procedure(s) Performed: Procedure(s): RIGHT OPEN CARPAL TUNNEL RELEASE, RIGHT LONG FINGER TRIGGER RELEASE,RIGHT INDEX FINGER TRIGGER RELEASE (Right) RELEASE TRIGGER FINGER/A-1 PULLEY (Right)  Patient Location: PACU  Anesthesia Type:MAC and Regional  Level of Consciousness: awake, alert , oriented and patient cooperative  Airway & Oxygen Therapy: Patient Spontanous Breathing and Patient connected to face mask oxygen  Post-op Assessment: Report given to RN, Post -op Vital signs reviewed and stable and Patient moving all extremities X 4  Post vital signs: Reviewed and stable  Last Vitals:  Filed Vitals:   09/05/15 0838 09/05/15 1043  BP: 118/86 151/79  Pulse: 89 72  Temp: 36.8 C 36.9 C  Resp: 20 20    Complications: No apparent anesthesia complications

## 2015-09-05 NOTE — Discharge Instructions (Signed)
° ° °  Keep dressing dry. Elevated right wrist above heart. Apply ice to palm side of wrist two hours on and one half hour off for 48 hours. May Apply ice at night and go to sleep with out changing. Be sure to keep ice off fingers to prevent frost bite.  Return to office in two weeks for incision exam right wrist.    Keep dressing dry. Elevated wrist above heart. No ice to the area of the surgery. After three day remove the dressing and apply a bandaid. May wet after 3 days with bandaid in place.  Return to office in ten days for removal of sutures right index and long finger. Call if any drainage, redness or worsening swelling.

## 2015-09-05 NOTE — Anesthesia Preprocedure Evaluation (Signed)
Anesthesia Evaluation  Patient identified by MRN, date of birth, ID band Patient awake    Reviewed: Allergy & Precautions, NPO status , Patient's Chart, lab work & pertinent test results  Airway Mallampati: I  TM Distance: >3 FB Neck ROM: Full    Dental   Pulmonary sleep apnea ,    Pulmonary exam normal        Cardiovascular Normal cardiovascular exam     Neuro/Psych Anxiety Depression    GI/Hepatic   Endo/Other  diabetes, Type 2, Oral Hypoglycemic Agents  Renal/GU      Musculoskeletal   Abdominal   Peds  Hematology   Anesthesia Other Findings   Reproductive/Obstetrics                             Anesthesia Physical Anesthesia Plan  ASA: III  Anesthesia Plan: Regional   Post-op Pain Management:    Induction: Intravenous  Airway Management Planned: Natural Airway  Additional Equipment:   Intra-op Plan:   Post-operative Plan:   Informed Consent: I have reviewed the patients History and Physical, chart, labs and discussed the procedure including the risks, benefits and alternatives for the proposed anesthesia with the patient or authorized representative who has indicated his/her understanding and acceptance.     Plan Discussed with: CRNA and Surgeon  Anesthesia Plan Comments:         Anesthesia Quick Evaluation

## 2015-09-05 NOTE — H&P (Signed)
Holly [name] is an 54 y.o. female.   Chief Complaint: right carpal tunnel, and right index/long trigger finger HPI: patient presents with the above complaints.  Failed conservative treatment  Past Medical History  Diagnosis Date  . Migraine   . Sleep apnea   . Anxiety     takes Ativan daily  . Depression     takes Cymbalta daily  . GERD (gastroesophageal reflux disease)     takes Prevacid daily  . Diabetes (HCC)     takes Metformin daily  . Hypothyroidism     takes Synthroid daily  . Thoracic outlet syndrome   . Lung nodule     right middle lobe-was being followed by Dr.Burney.Medical Md is keeping up with this  . History of bronchitis     > 5 yrs ago  . Migraine     last one 08/28/15  . Numbness and tingling in hands   . Joint pain   . Bone spur     neck and buldging disc  . Back pain     stenosis and buldging disc  . History of hiatal hernia   . Diverticulosis   . IBS (irritable bowel syndrome)   . Urinary urgency   . Nocturia   . Insomnia     doesn't take any meds    Past Surgical History  Procedure Laterality Date  . Thyroidectomy    . Cholecystectomy    . Tonsillectomy      adenoidectomy  . Abdominal exploration surgery      cancer cells in cervix  . Colonoscopy    . Esophagogastroduodenoscopy      Family History  Problem Relation Age of Onset  . COPD Mother   . Cancer Mother   . Heart disease Paternal Grandmother   . Heart disease Paternal Grandfather   . Heart disease Maternal Grandmother   . Heart disease Maternal Grandfather    Social History:  reports that she has never smoked. She does not have any smokeless tobacco history on file. She reports that she does not drink alcohol or use illicit drugs.  Allergies:  Allergies  Allergen Reactions  . Triptans Palpitations    No prescriptions prior to admission    No results found for this or any previous visit (from the past 48 hour(s)). No results found.  Review of Systems   Constitutional: Negative.   HENT: Negative.   Eyes: Negative.   Respiratory: Negative.   Cardiovascular: Negative.   Gastrointestinal: Negative.   Genitourinary: Negative.   Neurological: Negative.   Psychiatric/Behavioral: Negative.     There were no vitals taken for this visit. Physical Exam  Constitutional: She is oriented to person, place, and time. No distress.  HENT:  Head: Atraumatic.  Eyes: EOM are normal.  Neck: Normal range of motion.  Cardiovascular: Normal rate.   Respiratory: Effort normal.  GI: She exhibits no distension.  Musculoskeletal: She exhibits tenderness.  Neurological: She is alert and oriented to person, place, and time.  Skin: Skin is warm and dry.  Psychiatric: She has a normal mood and affect.     Assessment/Plan Right carpal tunnel syndrome and right long/index trigger finger. Will proceed with CTR and trigger finger releases as scheduled.  Procedure along with possible risks and complications discussed.  All questions answered.   OWENS,Wally Behan M 09/05/2015, 7:05 AM

## 2015-09-05 NOTE — Anesthesia Postprocedure Evaluation (Signed)
Anesthesia Post Note  Patient: Holly CrankerDonna M [name]  Procedure(s) Performed: Procedure(s) (LRB): RIGHT OPEN CARPAL TUNNEL RELEASE, RIGHT LONG FINGER TRIGGER RELEASE,RIGHT INDEX FINGER TRIGGER RELEASE (Right) RELEASE TRIGGER FINGER/A-1 PULLEY (Right)  Patient location during evaluation: PACU Anesthesia Type: Bier Block Level of consciousness: awake and alert Pain management: pain level controlled Vital Signs Assessment: post-procedure vital signs reviewed and stable Respiratory status: spontaneous breathing, nonlabored ventilation, respiratory function stable and patient connected to nasal cannula oxygen Cardiovascular status: blood pressure returned to baseline and stable Postop Assessment: no signs of nausea or vomiting Anesthetic complications: no    Last Vitals:  Filed Vitals:   09/05/15 1227 09/05/15 1242  BP: 136/98 123/84  Pulse:    Temp:    Resp:      Last Pain:  Filed Vitals:   09/05/15 1251  PainSc: 2                  Kimberli Winne DAVID

## 2015-09-05 NOTE — Brief Op Note (Signed)
09/05/2015  11:23 AM  PATIENT:  Holly [name]  54 y.o. female  PRE-OPERATIVE DIAGNOSIS:  right carpal tunnel syndrome, right long finger trigger finger, RIGHT INDEX TRIGGER FINGER  POST-OPERATIVE DIAGNOSIS:  right carpal tunnel syndrome, right long finger trigger finger, RIGHT INDEX TRIGGER FINGER  PROCEDURE:  Procedure(s): RIGHT OPEN CARPAL TUNNEL RELEASE, RIGHT LONG FINGER TRIGGER RELEASE,RIGHT INDEX FINGER TRIGGER RELEASE (Right) RELEASE TRIGGER FINGER/A-1 PULLEY (Right)  SURGEON:  Surgeon(s) and Role:    * Kerrin ChampagneJames E Nitka, MD - Primary  PHYSICIAN ASSISTANT: Zonia KiefJames Owens, PA-C  ANESTHESIA:   local, regional and IV sedation, Dr. Michelle Piperssey.  EBL:     BLOOD ADMINISTERED:none  DRAINS: none   LOCAL MEDICATIONS USED:  MARCAINE0.25% Amount: 10 ml  SPECIMEN:  No Specimen  DISPOSITION OF SPECIMEN:  N/A  COUNTS:  YES  TOURNIQUET:   Total Tourniquet Time Documented: Upper Arm (Right) - 39 minutes Total: Upper Arm (Right) - 39 minutes   DICTATION: .Reubin Milanragon Dictation  PLAN OF CARE: Discharge to home after PACU  PATIENT DISPOSITION:  PACU - hemodynamically stable.   Delay start of Pharmacological VTE agent (>24hrs) due to surgical blood loss or risk of bleeding: no

## 2015-09-05 NOTE — Op Note (Signed)
09/05/2015  11:30 AM  PATIENT:  Holly [name]  54 y.o. female  MRN: [id number]   OPERATIVE NOTE  PRE-OPERATIVE DIAGNOSIS:  right carpal tunnel syndrome, right long finger trigger finger, RIGHT INDEX TRIGGER FINGER  POST-OPERATIVE DIAGNOSIS:  right carpal tunnel syndrome, right long finger trigger finger, RIGHT INDEX TRIGGER FINGER  PROCEDURE:  Procedure(s): RIGHT OPEN CARPAL TUNNEL RELEASE, RIGHT LONG FINGER TRIGGER RELEASE,RIGHT INDEX FINGER TRIGGER RELEASE RELEASE TRIGGER FINGER/A-1 PULLEY    SURGEON:  Jessy Oto, MD     ASSISTANT:  Benjiman Core, PA-C  (Present throughout the entire procedure and necessary for completion of procedure in a timely manner)     ANESTHESIA:  Regional Bier Block  Right upper forearm Level, Supplemented with local Marcaine 0.5 % 10cc, Dr. Conrad Kimmell. Attempted axillary block was abandoned in favor of a Bier Regional block.    COMPLICATIONS:  None.     TOURNIQUET TIME: 45 minutes at  210mHg   EBL: 20cc  PROCEDURE: The patient was met in the holding area, and the appropriate right wrist, right index and right long fingers identified and marked with an "X" and my initials.identified and marked.  The patient was then transported to OR and was placed on the operative table in a supine position. The patient was then placed under Bier block anesthesia without difficulty. The patient received appropriate preoperative antibiotic prophylaxis.   Tourniquet was applied to the operative right forearm. The right hand wrist and distal forearm was then prepped using sterile conditions and draped using sterile technique. The right forearm and hand was elevated and exsanguinated with Esmarch a right forearm tourniquet elevated ot 250 mmHg. Dr. OConrad Burlingtonperformed a Bier Block of the right UE. Time-out procedure was called and correct.  The right hand wrist and distal forearm was then prepped using sterile conditions and draped using sterile technique. Loope maginification  used. Time-out procedure was called and correct.  The skin overlying the distal palmar crease at the level of the Index finger A-1 pulley (MCP joint) was infiltrated with 5 cc of marcaine 1/4% plain. A transverse incision was then made over the right Index MCP joint at the level of the distal palmar crease. Incision through skin and dermis only and subcutaneous layers spread in the midline with a hemostat down to the flexor tendon sheath overlying the long MCP joint. A1 pulley identified. Patient had a very thick band representing the proximal margin of the A-1 pulley double ended retractors were used on both sides retracting the digital nerves. A Stevens scissors then used to incise the flexor tendon sheath and the A1 pulley overlying the index MCP joint longitudinally incising through the thickened portion of the A1 pulley and dividing it until it was freed distally as well as proximally. Following inspection of the flexor tendons and determining that release was completed.   The skin overlying the distal palmar crease at the level of the long finger A-1 pulley (MCP joint) was infiltrated with 5 cc of marcaine 1/4% plain. A transverse incision was then made over the right long MCP joint at the level of the distal palmar crease. Incision through skin and dermis only and subcutaneous layers spread in the midline with a hemostat down to the flexor tendon sheath overlying the long MCP joint. A1 pulley identified. Patient had a very thick band representing the proximal margin of the A-1 pulley double ended retractors were used on both sides retracting the digital nerves. A Stevens scissors then used to incise the flexor  tendon sheath and the A1 pulley overlying the long MCP joint longitudinally incising through the thickened portion of the A1 pulley and dividing it until it was freed distally as well as proximally. Following inspection of the flexor tendons and determining that release was completed.      A 1.5  inch incision curved at the right wrist crease with 15 blade scalpel.  Incision through skin and subcutaneous tissue to the volar forearm fascia and  transverse carpal ligament. Fascia then carefully lifed and incised with Stevens scissors inline with the fourth digit. The skin and subcutaneous tissue retracted and the volar fascia divided under direct vision from distal to proximal. A freer elevator then carefully placed between the median nerve and the transverse carpal ligament protecting the  median nerve as the transverse carpal ligament was divided with a 15 blade scalpel in line with the fourth digit. Retracting the distal skin and subcutaneous tissues distally under direct visualization the remaining portions of the transverse carpal ligament were divided with tenotomy scissors again in line with the right fourth digit. The palmar fascia was then divided until the traversing superficial palmar arch was identified and preserved intact.  The motor branch of the median nerve was carefully examined and identified intact. Tourniquet was then released. Bleeding controlled with bipolar electrocautery. The incision was then irrigated with copious amounts of irrigant solution, No active bleeding was present. The incision closed with a single layer skin closure of 4-0 nylon horizontal mattress sutures.  The tendons at the right index and long finger A-1 pullies showed some symmetric swelling with no significant flexor tendon swelling that would represent tendinous injury or previous old injury. Following irrigation and then the trigger finger release incisions were closed with 2 interrupted 4-0 nylon sutures in horizontal mattress fashion and Dermabond applied. Dressing of Xeroform 4 x 4's fixed to the skin with kling. The right right open CTR incision was dressed with a dry dressing of adaptic, 4x4s held in place with sterile webril.  A well padded volar splint applied with ace wrap.  The patient  reactivated and returned to the PACU in good condition.  All instruments and sponge counts were correct.          NITKA,JAMES E  09/05/2015, 11:30 AM

## 2015-09-06 ENCOUNTER — Encounter (HOSPITAL_COMMUNITY): Payer: Self-pay | Admitting: Specialist

## 2015-10-04 ENCOUNTER — Encounter: Payer: Self-pay | Admitting: Occupational Therapy

## 2015-10-04 ENCOUNTER — Ambulatory Visit: Payer: BC Managed Care – PPO | Attending: Specialist | Admitting: Occupational Therapy

## 2015-10-04 DIAGNOSIS — R208 Other disturbances of skin sensation: Secondary | ICD-10-CM | POA: Insufficient documentation

## 2015-10-04 DIAGNOSIS — M79642 Pain in left hand: Secondary | ICD-10-CM | POA: Diagnosis present

## 2015-10-04 DIAGNOSIS — M6289 Other specified disorders of muscle: Secondary | ICD-10-CM | POA: Diagnosis present

## 2015-10-04 DIAGNOSIS — M79641 Pain in right hand: Secondary | ICD-10-CM | POA: Insufficient documentation

## 2015-10-04 DIAGNOSIS — R29898 Other symptoms and signs involving the musculoskeletal system: Secondary | ICD-10-CM

## 2015-10-04 DIAGNOSIS — R200 Anesthesia of skin: Secondary | ICD-10-CM

## 2015-10-04 NOTE — Therapy (Signed)
[facility] [address] [place], [place], [address] Phone: [phone]   Fax:  [phone]  Occupational Therapy Evaluation  Patient Details  Name: Holly CrankerDonna M [name] MRN: [id number] Date of Birth: 12/21/1960 Referring Provider: Dr. Vira BrownsJames Nitka  Encounter Date: 10/04/2015      OT End of Session - 10/04/15 1245    Visit Number 1   Number of Visits 17   Date for OT Re-Evaluation 11/30/15   Authorization Type BCBS State/Blue MCR - G CODE!   Authorization - Visit Number 1   Authorization - Number of Visits 10   OT Start Time 1145   OT Stop Time 1235   OT Time Calculation (min) 50 min   Activity Tolerance Patient tolerated treatment well      Past Medical History  Diagnosis Date  . Migraine   . Sleep apnea   . Anxiety     takes Ativan daily  . Depression     takes Cymbalta daily  . GERD (gastroesophageal reflux disease)     takes Prevacid daily  . Diabetes (HCC)     takes Metformin daily  . Hypothyroidism     takes Synthroid daily  . Thoracic outlet syndrome   . Lung nodule     right middle lobe-was being followed by Dr.Burney.Medical Md is keeping up with this  . History of bronchitis     > 5 yrs ago  . Migraine     last one 08/28/15  . Numbness and tingling in hands   . Joint pain   . Bone spur     neck and buldging disc  . Back pain     stenosis and buldging disc  . History of hiatal hernia   . Diverticulosis   . IBS (irritable bowel syndrome)   . Urinary urgency   . Nocturia   . Insomnia     doesn't take any meds    Past Surgical History  Procedure Laterality Date  . Thyroidectomy    . Cholecystectomy    . Tonsillectomy      adenoidectomy  . Abdominal exploration surgery      cancer cells in cervix  . Colonoscopy    . Esophagogastroduodenoscopy    . Carpal tunnel release Right 09/05/2015    Procedure: RIGHT OPEN CARPAL TUNNEL RELEASE, RIGHT LONG FINGER TRIGGER RELEASE,RIGHT INDEX  FINGER TRIGGER RELEASE;  Surgeon: Kerrin ChampagneJames E Nitka, MD;  Location: [facility] OR;  Service: Orthopedics;  Laterality: Right;  . Trigger finger release Right 09/05/2015    Procedure: RELEASE TRIGGER FINGER/A-1 PULLEY;  Surgeon: Kerrin ChampagneJames E Nitka, MD;  Location: [facility] OR;  Service: Orthopedics;  Laterality: Right;    There were no vitals filed for this visit.  Visit Diagnosis:  Pain in both hands - Plan: Ot plan of care cert/re-cert  Weakness of right hand - Plan: Ot plan of care cert/re-cert  Numbness of left hand - Plan: Ot plan of care cert/re-cert      Subjective Assessment - 10/04/15 1155    Subjective  The plan is to also do my Lt hand once my Rt hand resolved (re: surgery)   Pertinent History CTS and trigger finger Lt hand as well, cervical buldging disc, stenosis, thoracic outlet syndrome   Patient Stated Goals I want my Rt hand back to normal before doing surgery on the Lt hand   Currently in Pain? Yes   Pain Score 5    Pain Location Hand  8-9/10 Lt hand especially at  night   Pain Orientation Right;Left   Pain Descriptors / Indicators Shooting   Pain Type Chronic pain;Surgical pain   Pain Onset More than a month ago   Pain Frequency Constant   Aggravating Factors  overuse   Pain Relieving Factors certain palpations           [facility] OT Assessment - 10/04/15 0001    Assessment   Diagnosis s/p Rt CTR, s/p index and long trigger finger release  Lt hand CTS and trigger finger as well (Surgery in future)   Referring Provider Dr. Vira Browns   Onset Date 09/05/15   Prior Therapy none   Precautions   Precautions None   Balance Screen   Has the patient fallen in the past 6 months No   Has the patient had a decrease in activity level because of a fear of falling?  No   Is the patient reluctant to leave their home because of a fear of falling?  No   Home  Environment   Additional Comments Pt lives in 1 story home, 4 steps to enter.    Lives With Spouse   Prior Function   Level of  Independence Needs assistance with homemaking   Vocation On disability  due to chronic migraines   ADL   ADL comments Eating/grooming with Rt hand. Min assist for dressing: hooking bra, zippers, and tying shoes. Mod I for bathing. Inconsistent with cooking due to migraines and does need assist with cooking due to bilateral hand pain. Husband has always performed cleaning   Mobility   Mobility Status Independent   Written Expression   Dominant Hand Right   Handwriting 90% legible  reports slightly worse   Cognition   Memory Impaired   Memory Impairment Decreased recall of new information;Decreased short term memory  due to chronic migraines and/or possibly medications   Sensation   Light Touch Appears Intact  Rt hand and Lt hand, but reports numbness in Lt hand   Coordination   9 Hole Peg Test Right;Left   Right 9 Hole Peg Test 30 sec   Left 9 Hole Peg Test 27.16 sec   Edema   Edema bilateral hands - fluctuates   ROM / Strength   AROM / PROM / Strength AROM   AROM   Overall AROM Comments BUE AROM WFL's except approx. 90% full composite flexion Rt hand and difficulty opposing to 5th digit   Hand Function   Right Hand Grip (lbs) 28 lbs   Right Hand Lateral Pinch 14 lbs   Right Hand 3 Point Pinch 13 lbs   Left Hand Grip (lbs) 75 lbs   Left Hand Lateral Pinch 21 lbs   Left 3 point pinch 20 lbs                           OT Short Term Goals - 10/04/15 1252    OT SHORT TERM GOAL #1   Title independent with initial HEP (All STG's due 11/03/15)    Time 4   Period Weeks   Status New   OT SHORT TERM GOAL #2   Title Pt to id pain management strategies for hands including modalities, stretches, exercises and scar massage/management   Time 4   Period Weeks   Status New   OT SHORT TERM GOAL #3   Title Improve grip strength Rt hand by 10 lbs or greater   Baseline eval = 28 lbs   Time  4   Period Weeks   Status New   OT SHORT TERM GOAL #4   Title Pain less than  or equal to 3/10 Rt hand with BADLS and exercises   Baseline up to 6/10    Time 4   Period Weeks   Status New           OT Long Term Goals - 22-Oct-2015 1256    OT LONG TERM GOAL #1   Title Independent with updated HEP (All LTG's due 11/30/15)   Time 8   Period Weeks   Status New   OT LONG TERM GOAL #2   Title Improve grip strength Rt hand by 20 lbs for opening jars/containers   Baseline eval = 28 lbs   Time 8   Period Weeks   Status New   OT LONG TERM GOAL #3   Title Improve lateral and pinch strength Rt hand by at least 3 lbs   Baseline eval : lateral = 14, 3 tip = 13   Time 8   Period Weeks   Status New   OT LONG TERM GOAL #4   Title Pt to return to using Rt hand at PLOF in prep for surgery to Lt hand   Time 8   Period Weeks   Status New               Plan - 10/22/2015 1247    Clinical Impression Statement Pt is a 55 y.o. female who presents to outpatient rehab s/p CTR and trigger finger release of index and long finger Rt dominant hand on 09/05/15. Pt also has CTS and trigger finger of Lt hand with surgery in near future once Rt hand has improved. Pt also has chronic migranes, cervical buldging discs, and thoracic outlet syndrome   Pt will benefit from skilled therapeutic intervention in order to improve on the following deficits (Retired) Decreased coordination;Decreased range of motion;Impaired flexibility;Increased edema;Impaired sensation;Decreased skin integrity;Impaired UE functional use;Pain;Decreased scar mobility;Decreased cognition;Decreased strength;Impaired perceived functional ability   Rehab Potential Good   Clinical Impairments Affecting Rehab Potential pain/numbness in Rt and Lt hand, chronic migraines   OT Frequency 2x / week   OT Duration 8 weeks  plus evaluation   OT Treatment/Interventions Self-care/ADL training;Therapeutic exercise;Patient/family education;Ultrasound;Manual Therapy;Splinting;Energy conservation;Parrafin;Cryotherapy;DME and/or AE  instruction;Compression bandaging;Therapeutic activities;Electrical Stimulation;Fluidtherapy;Scar mobilization;Cognitive remediation/compensation;Moist Heat;Contrast Bath;Passive range of motion   Plan CTR and trigger finger release post surgical management Rt hand (including HEP, scar mobility) and CTS handout for Lt hand   Consulted and Agree with Plan of Care Patient          G-Codes - 10-22-15 1259    Functional Assessment Tool Used Rt hand: grip = 28 lbs, lateral pinch = 14 lbs, 3 tip pinch = 13 lbs, pain 6/10   Functional Limitation Carrying, moving and handling objects   Carrying, Moving and Handling Objects Current Status (Z6109) At least 40 percent but less than 60 percent impaired, limited or restricted   Carrying, Moving and Handling Objects Goal Status (U0454) At least 1 percent but less than 20 percent impaired, limited or restricted      Problem List Patient Active Problem List   Diagnosis Date Noted  . Carpal tunnel syndrome, right 09/05/2015    Class: Chronic  . Trigger finger, acquired 09/05/2015    Class: Chronic  . Pulmonary nodules 01/16/2013  . Dyspnea 01/13/2013    Kelli Churn, OTR/L Oct 22, 2015, 1:03 PM  [facility] [address]  [address] [place], [place], [address] Phone: [phone]   Fax:  [phone]  Name: Ralph Benavidez Ditter MRN: [id number] Date of Birth: August 28, 1961

## 2015-10-10 ENCOUNTER — Ambulatory Visit: Payer: BC Managed Care – PPO | Admitting: Occupational Therapy

## 2015-10-13 ENCOUNTER — Ambulatory Visit: Payer: BC Managed Care – PPO | Admitting: Occupational Therapy

## 2015-10-19 ENCOUNTER — Ambulatory Visit: Payer: BC Managed Care – PPO | Admitting: Occupational Therapy

## 2015-10-19 DIAGNOSIS — M79641 Pain in right hand: Secondary | ICD-10-CM | POA: Diagnosis not present

## 2015-10-19 DIAGNOSIS — M79642 Pain in left hand: Principal | ICD-10-CM

## 2015-10-19 DIAGNOSIS — R29898 Other symptoms and signs involving the musculoskeletal system: Secondary | ICD-10-CM

## 2015-10-19 DIAGNOSIS — R200 Anesthesia of skin: Secondary | ICD-10-CM

## 2015-10-20 NOTE — Therapy (Signed)
[facility] [address] [place], [place], [address] Phone: [phone]   Fax:  [phone]  Occupational Therapy Treatment  Patient Details  Name: Holly [name] MRN: [id number] Date of Birth: 01/01/1961 Referring Provider: Dr. Vira Browns  Encounter Date: 10/19/2015      OT End of Session - 10/19/15 1354    Visit Number 2   Number of Visits 17   Date for OT Re-Evaluation 11/30/15   Authorization Type BCBS State/Blue MCR - G CODE!   Authorization - Visit Number 2   Authorization - Number of Visits 10   OT Start Time 1322   OT Stop Time 1400   OT Time Calculation (min) 38 min   Activity Tolerance Patient tolerated treatment well   Behavior During Therapy WFL for tasks assessed/performed      Past Medical History  Diagnosis Date  . Migraine   . Sleep apnea   . Anxiety     takes Ativan daily  . Depression     takes Cymbalta daily  . GERD (gastroesophageal reflux disease)     takes Prevacid daily  . Diabetes (HCC)     takes Metformin daily  . Hypothyroidism     takes Synthroid daily  . Thoracic outlet syndrome   . Lung nodule     right middle lobe-was being followed by Dr.Burney.Medical Md is keeping up with this  . History of bronchitis     > 5 yrs ago  . Migraine     last one 08/28/15  . Numbness and tingling in hands   . Joint pain   . Bone spur     neck and buldging disc  . Back pain     stenosis and buldging disc  . History of hiatal hernia   . Diverticulosis   . IBS (irritable bowel syndrome)   . Urinary urgency   . Nocturia   . Insomnia     doesn't take any meds    Past Surgical History  Procedure Laterality Date  . Thyroidectomy    . Cholecystectomy    . Tonsillectomy      adenoidectomy  . Abdominal exploration surgery      cancer cells in cervix  . Colonoscopy    . Esophagogastroduodenoscopy    . Carpal tunnel release Right 09/05/2015    Procedure: RIGHT OPEN CARPAL TUNNEL  RELEASE, RIGHT LONG FINGER TRIGGER RELEASE,RIGHT INDEX FINGER TRIGGER RELEASE;  Surgeon: Kerrin Champagne, MD;  Location: [facility] OR;  Service: Orthopedics;  Laterality: Right;  . Trigger finger release Right 09/05/2015    Procedure: RELEASE TRIGGER FINGER/A-1 PULLEY;  Surgeon: Kerrin Champagne, MD;  Location: [facility] OR;  Service: Orthopedics;  Laterality: Right;    There were no vitals filed for this visit.  Visit Diagnosis:  Pain in both hands  Weakness of right hand  Numbness of left hand      Subjective Assessment - 10/19/15 1321    Subjective  Pt reports that she has been trying to work at home and that she wants to get better   Patient is accompained by: Family member   Pertinent History CTS and trigger finger Lt hand as well, cervical buldging disc, stenosis, thoracic outlet syndrome   Patient Stated Goals I want my Rt hand back to normal before doing surgery on the Lt hand   Currently in Pain? Yes   Pain Score 4    Pain Orientation Right;Left   Pain Descriptors / Indicators Shooting  Pain Type Chronic pain;Surgical pain   Pain Onset More than a month ago   Pain Frequency Constant   Aggravating Factors  use, movement   Pain Relieving Factors rest, ice                      OT Treatments/Exercises (OP) - 10/20/15 0001    Modalities   Modalities Ultrasound   Ultrasound   Ultrasound Location both incision sites in R hand (at wrist and palm) x68min to each site=10 min total   Ultrasound Parameters , 20% pulsed, 0.8wts/cm2, with no adverse reactions   Ultrasound Goals --  scar tissue                OT Education - 10/19/15 1348    Education provided Yes   Education Details CTR protocol (tendon glides, thumb flexion, scar massage, heat/ice, and yellow putty exercises)   Person(s) Educated Patient;Spouse   Methods Explanation;Demonstration;Verbal cues;Handout   Comprehension Returned demonstration;Verbal cues required;Verbalized understanding          OT  Short Term Goals - 10/04/15 1252    OT SHORT TERM GOAL #1   Title independent with initial HEP (All STG's due 11/03/15)    Time 4   Period Weeks   Status New   OT SHORT TERM GOAL #2   Title Pt to id pain management strategies for hands including modalities, stretches, exercises and scar massage/management   Time 4   Period Weeks   Status New   OT SHORT TERM GOAL #3   Title Improve grip strength Rt hand by 10 lbs or greater   Baseline eval = 28 lbs   Time 4   Period Weeks   Status New   OT SHORT TERM GOAL #4   Title Pain less than or equal to 3/10 Rt hand with BADLS and exercises   Baseline up to 6/10    Time 4   Period Weeks   Status New           OT Long Term Goals - 10/04/15 1256    OT LONG TERM GOAL #1   Title Independent with updated HEP (All LTG's due 11/30/15)   Time 8   Period Weeks   Status New   OT LONG TERM GOAL #2   Title Improve grip strength Rt hand by 20 lbs for opening jars/containers   Baseline eval = 28 lbs   Time 8   Period Weeks   Status New   OT LONG TERM GOAL #3   Title Improve lateral and pinch strength Rt hand by at least 3 lbs   Baseline eval : lateral = 14, 3 tip = 13   Time 8   Period Weeks   Status New   OT LONG TERM GOAL #4   Title Pt to return to using Rt hand at PLOF in prep for surgery to Lt hand   Time 8   Period Weeks   Status New               Plan - 10/20/15 0020    Clinical Impression Statement Pt reports that hand feels better after ultrasound.  Pt able to return demo HEP.   Plan continue with ultrasound/modalities, CTS conservative management handout for L hand   OT Home Exercise Plan Education issued:  HEP 10/20/15 (yellow putty, tendon glides, thumb flex)   Consulted and Agree with Plan of Care Patient;Family member/caregiver   Family Member Consulted husband  Problem List Patient Active Problem List   Diagnosis Date Noted  . Carpal tunnel syndrome, right 09/05/2015    Class: Chronic  . Trigger  finger, acquired 09/05/2015    Class: Chronic  . Pulmonary nodules 01/16/2013  . Dyspnea 01/13/2013    [facility] 10/20/2015, 12:23 AM  [facility] [address] [place], [place], [address] Phone: [phone]   Fax:  [phone]  Name: Marticia Reifschneider Wands MRN: [id number] Date of Birth: May 07, 1961  Willa Frater, OTR/L 10/20/2015 12:23 AM

## 2015-10-21 ENCOUNTER — Ambulatory Visit: Payer: BC Managed Care – PPO | Admitting: Occupational Therapy

## 2015-10-21 DIAGNOSIS — M79642 Pain in left hand: Principal | ICD-10-CM

## 2015-10-21 DIAGNOSIS — M79641 Pain in right hand: Secondary | ICD-10-CM

## 2015-10-21 DIAGNOSIS — R200 Anesthesia of skin: Secondary | ICD-10-CM

## 2015-10-21 DIAGNOSIS — R29898 Other symptoms and signs involving the musculoskeletal system: Secondary | ICD-10-CM

## 2015-10-21 NOTE — Therapy (Signed)
[facility] [address] [place], [place], [address] Phone: [phone]   Fax:  [phone]  Occupational Therapy Treatment  Patient Details  Name: Holly [name] MRN: [id number] Date of Birth: 05/23/1961 Referring Provider: Dr. Vira Browns  Encounter Date: 10/21/2015      OT End of Session - 10/21/15 1141    Visit Number 3   Number of Visits 17   Date for OT Re-Evaluation 11/30/15   Authorization Type BCBS State/Blue MCR - G CODE!   Authorization - Visit Number 3   Authorization - Number of Visits 10   OT Start Time 1105   OT Stop Time 1145   OT Time Calculation (min) 40 min      Past Medical History  Diagnosis Date  . Migraine   . Sleep apnea   . Anxiety     takes Ativan daily  . Depression     takes Cymbalta daily  . GERD (gastroesophageal reflux disease)     takes Prevacid daily  . Diabetes (HCC)     takes Metformin daily  . Hypothyroidism     takes Synthroid daily  . Thoracic outlet syndrome   . Lung nodule     right middle lobe-was being followed by Dr.Burney.Medical Md is keeping up with this  . History of bronchitis     > 5 yrs ago  . Migraine     last one 08/28/15  . Numbness and tingling in hands   . Joint pain   . Bone spur     neck and buldging disc  . Back pain     stenosis and buldging disc  . History of hiatal hernia   . Diverticulosis   . IBS (irritable bowel syndrome)   . Urinary urgency   . Nocturia   . Insomnia     doesn't take any meds    Past Surgical History  Procedure Laterality Date  . Thyroidectomy    . Cholecystectomy    . Tonsillectomy      adenoidectomy  . Abdominal exploration surgery      cancer cells in cervix  . Colonoscopy    . Esophagogastroduodenoscopy    . Carpal tunnel release Right 09/05/2015    Procedure: RIGHT OPEN CARPAL TUNNEL RELEASE, RIGHT LONG FINGER TRIGGER RELEASE,RIGHT INDEX FINGER TRIGGER RELEASE;  Surgeon: Kerrin Champagne, MD;   Location: [facility];  Service: Orthopedics;  Laterality: Right;  . Trigger finger release Right 09/05/2015    Procedure: RELEASE TRIGGER FINGER/A-1 PULLEY;  Surgeon: Kerrin Champagne, MD;  Location: [facility];  Service: Orthopedics;  Laterality: Right;    There were no vitals filed for this visit.  Visit Diagnosis:  Pain in both hands  Weakness of right hand  Numbness of left hand      Subjective Assessment - 10/21/15 1112    Pertinent History CTS and trigger finger Lt hand as well, cervical buldging disc, stenosis, thoracic outlet syndrome   Patient Stated Goals I want my Rt hand back to normal before doing surgery on the Lt hand   Currently in Pain? Yes   Pain Score 5    Pain Location Hand   Pain Orientation Right;Left   Pain Descriptors / Indicators Shooting   Pain Type Chronic pain;Acute pain   Pain Onset More than a month ago   Pain Frequency Constant   Aggravating Factors  use, movement   Pain Relieving Factors rest,    Multiple Pain Sites No  Treatment: Fluidotherapy x 13 mins to RUE stiffness and desensitization, no adverse reactions. Modalities      Modalities  Ultrasound     Ultrasound     Ultrasound Location  both incision sites in R hand (at wrist and palm) x16min to each site=10 min total     Ultrasound Parameters  , 20% pulsed, 0.8wts/cm2, with no adverse reactions     Ultrasound Goals  --  scar tissue  followed by scar massage soft tissue mobs to palm      Reviewed tendon gliding exercises 10 reps each hand. Education provided regarding conservative tx of CTS for LUE and pt performed exercise 1 off handout. Pt verbalized understanding of education.                                    OT Short Term Goals - 10/04/15 1252    OT SHORT TERM GOAL #1   Title independent with initial HEP (All STG's due 11/03/15)    Time 4   Period Weeks   Status New   OT SHORT TERM GOAL #2   Title Pt to id pain management strategies for hands including  modalities, stretches, exercises and scar massage/management   Time 4   Period Weeks   Status New   OT SHORT TERM GOAL #3   Title Improve grip strength Rt hand by 10 lbs or greater   Baseline eval = 28 lbs   Time 4   Period Weeks   Status New   OT SHORT TERM GOAL #4   Title Pain less than or equal to 3/10 Rt hand with BADLS and exercises   Baseline up to 6/10    Time 4   Period Weeks   Status New           OT Long Term Goals - 10/04/15 1256    OT LONG TERM GOAL #1   Title Independent with updated HEP (All LTG's due 11/30/15)   Time 8   Period Weeks   Status New   OT LONG TERM GOAL #2   Title Improve grip strength Rt hand by 20 lbs for opening jars/containers   Baseline eval = 28 lbs   Time 8   Period Weeks   Status New   OT LONG TERM GOAL #3   Title Improve lateral and pinch strength Rt hand by at least 3 lbs   Baseline eval : lateral = 14, 3 tip = 13   Time 8   Period Weeks   Status New   OT LONG TERM GOAL #4   Title Pt to return to using Rt hand at PLOF in prep for surgery to Lt hand   Time 8   Period Weeks   Status New               Plan - 10/21/15 1115    Clinical Impression Statement Pt is progressing towards goals. she reports overdoing exercises at home.   Pt will benefit from skilled therapeutic intervention in order to improve on the following deficits (Retired) Decreased coordination;Decreased range of motion;Impaired flexibility;Increased edema;Impaired sensation;Decreased skin integrity;Impaired UE functional use;Pain;Decreased scar mobility;Decreased cognition;Decreased strength;Impaired perceived functional ability   Rehab Potential Good   Clinical Impairments Affecting Rehab Potential pain/numbness in Rt and Lt hand, chronic migraines   OT Frequency 2x / week   OT Duration 8 weeks   OT Treatment/Interventions Self-care/ADL training;Therapeutic exercise;Patient/family education;Ultrasound;Manual Therapy;Splinting;Energy  conservation;Parrafin;Cryotherapy;DME and/or AE instruction;Compression bandaging;Therapeutic activities;Electrical Stimulation;Fluidtherapy;Scar mobilization;Cognitive remediation/compensation;Moist Heat;Contrast Bath;Passive range of motion   Plan ultrasound, progress exercises   Consulted and Agree with Plan of Care Patient        Problem List Patient Active Problem List   Diagnosis Date Noted  . Carpal tunnel syndrome, right 09/05/2015    Class: Chronic  . Trigger finger, acquired 09/05/2015    Class: Chronic  . Pulmonary nodules 01/16/2013  . Dyspnea 01/13/2013    RINE,KATHRYN 10/21/2015, 11:42 AM Keene Breath, OTR/L [phone] Phone: [phone] 11:42 AM 10/21/2015 [facility] [address] [place], [place], [address] Phone: [phone]   Fax:  [phone]  Name: Aquilla Shambley Nikkel MRN: [id number] Date of Birth: Dec 01, 1960

## 2015-10-28 ENCOUNTER — Ambulatory Visit: Payer: BC Managed Care – PPO | Admitting: Occupational Therapy

## 2015-10-31 ENCOUNTER — Ambulatory Visit: Payer: BC Managed Care – PPO | Admitting: Occupational Therapy

## 2015-11-03 ENCOUNTER — Encounter: Payer: Self-pay | Admitting: Occupational Therapy

## 2015-11-03 ENCOUNTER — Ambulatory Visit: Payer: BC Managed Care – PPO | Attending: Specialist | Admitting: Occupational Therapy

## 2015-11-03 DIAGNOSIS — R208 Other disturbances of skin sensation: Secondary | ICD-10-CM | POA: Insufficient documentation

## 2015-11-03 DIAGNOSIS — R200 Anesthesia of skin: Secondary | ICD-10-CM

## 2015-11-03 DIAGNOSIS — M79642 Pain in left hand: Secondary | ICD-10-CM | POA: Insufficient documentation

## 2015-11-03 DIAGNOSIS — M79641 Pain in right hand: Secondary | ICD-10-CM | POA: Diagnosis not present

## 2015-11-03 DIAGNOSIS — M6289 Other specified disorders of muscle: Secondary | ICD-10-CM | POA: Diagnosis not present

## 2015-11-03 NOTE — Therapy (Signed)
[facility] [address] [place], [place], [address] Phone: [phone]   Fax:  [phone]  Occupational Therapy Treatment  Patient Details  Name: Holly [name] MRN: [id number] Date of Birth: [birth year] Referring Provider: Dr. Vira Browns  Encounter Date: 11/03/2015      OT End of Session - 11/03/15 1641    Visit Number 4   Number of Visits 17   Date for OT Re-Evaluation 11/30/15   Authorization Type BCBS State/Blue MCR - G CODE!   Authorization - Visit Number 4   Authorization - Number of Visits 10   OT Start Time 1535   OT Stop Time 1620   OT Time Calculation (min) 45 min   Activity Tolerance Patient tolerated treatment well      Past Medical History  Diagnosis Date  . Migraine   . Sleep apnea   . Anxiety     takes Ativan daily  . Depression     takes Cymbalta daily  . GERD (gastroesophageal reflux disease)     takes Prevacid daily  . Diabetes (HCC)     takes Metformin daily  . Hypothyroidism     takes Synthroid daily  . Thoracic outlet syndrome   . Lung nodule     right middle lobe-was being followed by Dr.Burney.Medical Md is keeping up with this  . History of bronchitis     > 5 yrs ago  . Migraine     last one 08/28/15  . Numbness and tingling in hands   . Joint pain   . Bone spur     neck and buldging disc  . Back pain     stenosis and buldging disc  . History of hiatal hernia   . Diverticulosis   . IBS (irritable bowel syndrome)   . Urinary urgency   . Nocturia   . Insomnia     doesn't take any meds    Past Surgical History  Procedure Laterality Date  . Thyroidectomy    . Cholecystectomy    . Tonsillectomy      adenoidectomy  . Abdominal exploration surgery      cancer cells in cervix  . Colonoscopy    . Esophagogastroduodenoscopy    . Carpal tunnel release Right 09/05/2015    Procedure: RIGHT OPEN CARPAL TUNNEL RELEASE, RIGHT LONG FINGER TRIGGER RELEASE,RIGHT INDEX FINGER  TRIGGER RELEASE;  Surgeon: Kerrin Champagne, MD;  Location: [facility];  Service: Orthopedics;  Laterality: Right;  . Trigger finger release Right 09/05/2015    Procedure: RELEASE TRIGGER FINGER/A-1 PULLEY;  Surgeon: Kerrin Champagne, MD;  Location: [facility];  Service: Orthopedics;  Laterality: Right;    There were no vitals filed for this visit.  Visit Diagnosis:  Pain in both hands  Numbness of left hand      Subjective Assessment - 11/03/15 1558    Subjective  Pt reports Rt hand is better   Patient is accompained by: Family member   Pertinent History CTS and trigger finger Lt hand as well, cervical buldging disc, stenosis, thoracic outlet syndrome   Patient Stated Goals I want my Rt hand back to normal before doing surgery on the Lt hand   Currently in Pain? Yes   Pain Score 2   Lt hand up to 9/10     Pain Location Hand   Pain Orientation Right   Pain Type Acute pain;Chronic pain   Pain Onset More than a month ago   Pain Frequency Constant  Aggravating Factors  early morning   Pain Relieving Factors heat                      OT Treatments/Exercises (OP) - 11/03/15 0001    ADLs   ADL Comments Discussed continuing exercises including putty for Rt hand and conservative measures for Lt hand. Pt also instructed to avoid repetitive gripping Lt hand and ice along base of MP volarly Lt hand for trigger finger   Ultrasound   Ultrasound Location both incision sites Rt hand (at wrist and palm) x 5 min. to each site = 10 min total   Ultrasound Parameters 3 Mhz, continous, 0.8 wts/cm2, with no adverse reactions   Ultrasound Goals Pain  scar tissue   Manual Therapy   Manual Therapy Soft tissue mobilization   Soft tissue mobilization and light traction at carpal tunnel. Scar massage along incisions                  OT Short Term Goals - 10/04/15 1252    OT SHORT TERM GOAL #1   Title independent with initial HEP (All STG's due 11/03/15)    Time 4   Period Weeks   Status  New   OT SHORT TERM GOAL #2   Title Pt to id pain management strategies for hands including modalities, stretches, exercises and scar massage/management   Time 4   Period Weeks   Status New   OT SHORT TERM GOAL #3   Title Improve grip strength Rt hand by 10 lbs or greater   Baseline eval = 28 lbs   Time 4   Period Weeks   Status New   OT SHORT TERM GOAL #4   Title Pain less than or equal to 3/10 Rt hand with BADLS and exercises   Baseline up to 6/10    Time 4   Period Weeks   Status New           OT Long Term Goals - 10/04/15 1256    OT LONG TERM GOAL #1   Title Independent with updated HEP (All LTG's due 11/30/15)   Time 8   Period Weeks   Status New   OT LONG TERM GOAL #2   Title Improve grip strength Rt hand by 20 lbs for opening jars/containers   Baseline eval = 28 lbs   Time 8   Period Weeks   Status New   OT LONG TERM GOAL #3   Title Improve lateral and pinch strength Rt hand by at least 3 lbs   Baseline eval : lateral = 14, 3 tip = 13   Time 8   Period Weeks   Status New   OT LONG TERM GOAL #4   Title Pt to return to using Rt hand at PLOF in prep for surgery to Lt hand   Time 8   Period Weeks   Status New               Plan - 11/03/15 1641    Clinical Impression Statement Pt reports the continuous ultrasound helped more than pulsed.    Plan continue ultrasound Rt hand, strengthening Rt hand   OT Home Exercise Plan Education issued:  HEP 10/20/15 (yellow putty, tendon glides, thumb flex)   Consulted and Agree with Plan of Care Patient   Family Member Consulted husband        Problem List Patient Active Problem List   Diagnosis Date Noted  . Carpal tunnel  syndrome, right 09/05/2015    Class: Chronic  . Trigger finger, acquired 09/05/2015    Class: Chronic  . Pulmonary nodules 01/16/2013  . Dyspnea 01/13/2013    Kelli Churn, OTR/L 11/03/2015, 4:43 PM  [facility] [address] [place], [place], [address] Phone: [phone]   Fax:  [phone]  Name: Holly [name] MRN: [id number] Date of Birth: 06-11-1961

## 2015-11-15 ENCOUNTER — Encounter: Payer: Self-pay | Admitting: Occupational Therapy

## 2015-11-15 ENCOUNTER — Ambulatory Visit: Payer: BC Managed Care – PPO | Admitting: Occupational Therapy

## 2015-11-15 DIAGNOSIS — M79641 Pain in right hand: Secondary | ICD-10-CM | POA: Diagnosis not present

## 2015-11-15 DIAGNOSIS — R29898 Other symptoms and signs involving the musculoskeletal system: Secondary | ICD-10-CM

## 2015-11-15 NOTE — Therapy (Signed)
[facility] [address] [place], [place], [address] Phone: [phone]   Fax:  [phone]  Occupational Therapy Treatment  Patient Details  Name: Holly [name] MRN: [id number] Date of Birth: Jun 16, 1961 Referring Provider: Dr. Basil Dess  Encounter Date: 11/15/2015      OT End of Session - 11/15/15 1054    Visit Number 5   Number of Visits 17   Date for OT Re-Evaluation 11/30/15   Authorization Type BCBS State/Blue MCR - G CODE!   Authorization - Visit Number 5   Authorization - Number of Visits 10   OT Start Time 1015   OT Stop Time 1100   OT Time Calculation (min) 45 min   Activity Tolerance Patient tolerated treatment well      Past Medical History  Diagnosis Date  . Migraine   . Sleep apnea   . Anxiety     takes Ativan daily  . Depression     takes Cymbalta daily  . GERD (gastroesophageal reflux disease)     takes Prevacid daily  . Diabetes ([place])     takes Metformin daily  . Hypothyroidism     takes Synthroid daily  . Thoracic outlet syndrome   . Lung nodule     right middle lobe-was being followed by Dr.Burney.Medical Md is keeping up with this  . History of bronchitis     > 5 yrs ago  . Migraine     last one 08/28/15  . Numbness and tingling in hands   . Joint pain   . Bone spur     neck and buldging disc  . Back pain     stenosis and buldging disc  . History of hiatal hernia   . Diverticulosis   . IBS (irritable bowel syndrome)   . Urinary urgency   . Nocturia   . Insomnia     doesn't take any meds    Past Surgical History  Procedure Laterality Date  . Thyroidectomy    . Cholecystectomy    . Tonsillectomy      adenoidectomy  . Abdominal exploration surgery      cancer cells in cervix  . Colonoscopy    . Esophagogastroduodenoscopy    . Carpal tunnel release Right 09/05/2015    Procedure: RIGHT OPEN CARPAL TUNNEL RELEASE, RIGHT LONG FINGER TRIGGER RELEASE,RIGHT INDEX  FINGER TRIGGER RELEASE;  Surgeon: Jessy Oto, MD;  Location: [facility];  Service: Orthopedics;  Laterality: Right;  . Trigger finger release Right 09/05/2015    Procedure: RELEASE TRIGGER FINGER/A-1 PULLEY;  Surgeon: Jessy Oto, MD;  Location: [facility];  Service: Orthopedics;  Laterality: Right;    There were no vitals filed for this visit.  Visit Diagnosis:  Weakness of right hand      Subjective Assessment - 11/15/15 1019    Subjective  My hands are not hurting right now, it's just tight feeling   Pertinent History CTS and trigger finger Lt hand as well, cervical buldging disc, stenosis, thoracic outlet syndrome   Patient Stated Goals I want my Rt hand back to normal before doing surgery on the Lt hand   Currently in Pain? No/denies                      OT Treatments/Exercises (OP) - 11/15/15 0001    ADLs   ADL Comments Assessed STG's and progress to date. Pt with significantly improved grip strength   Exercises   Exercises  Hand   Hand Exercises   Other Hand Exercises Gripper set at 55 lbs. resistance to pick up blocks for sustained grip strength Rt hand with 1 rest break to stretch hand (attempted 75 lbs resistance but very difficult at beginning therefore decreased to 55 lbs resistance)    Ultrasound   Ultrasound Location both incision sites Rt hand (at wrist and distal palm) x 5 minutes each site = 10 min total   Ultrasound Parameters 3 Mhz, continuous, 0.8 wts/cm2, with no adverse reactions   Ultrasound Goals --  scar tissue management around incisions   Manual Therapy   Manual Therapy Soft tissue mobilization   Soft tissue mobilization and light traction at carpal tunnel. Scar massage along incisions                  OT Short Term Goals - 11/15/15 1045    OT SHORT TERM GOAL #1   Title independent with initial HEP (All STG's due 11/03/15)    Time 4   Period Weeks   Status Achieved   OT SHORT TERM GOAL #2   Title Pt to id pain management strategies  for hands including modalities, stretches, exercises and scar massage/management   Time 4   Period Weeks   Status Achieved   OT SHORT TERM GOAL #3   Title Improve grip strength Rt hand by 10 lbs or greater   Baseline eval = 28 lbs   Time 4   Period Weeks   Status Achieved  11/14/15: Rt grip = 80 lbs   OT SHORT TERM GOAL #4   Title Pain less than or equal to 3/10 Rt hand with BADLS and exercises   Baseline up to 6/10    Time 4   Period Weeks   Status Achieved           OT Long Term Goals - 10/04/15 1256    OT LONG TERM GOAL #1   Title Independent with updated HEP (All LTG's due 11/30/15)   Time 8   Period Weeks   Status New   OT LONG TERM GOAL #2   Title Improve grip strength Rt hand by 20 lbs for opening jars/containers   Baseline eval = 28 lbs   Time 8   Period Weeks   Status New   OT LONG TERM GOAL #3   Title Improve lateral and pinch strength Rt hand by at least 3 lbs   Baseline eval : lateral = 14, 3 tip = 13   Time 8   Period Weeks   Status New   OT LONG TERM GOAL #4   Title Pt to return to using Rt hand at PLOF in prep for surgery to Lt hand   Time 8   Period Weeks   Status New               Plan - 11/15/15 1055    Clinical Impression Statement Pt met all STG's with significantly improved grip strength Rt hand from evaluation.    Plan continue ultrasound Rt hand for scar tissue management, strengthening and pinch strength Rt hand, begin checking LTG's (pt approximating), ISSUE green putty   OT Home Exercise Plan Education issued:  HEP 10/20/15 (yellow putty, tendon glides, thumb flex)   Consulted and Agree with Plan of Care Patient        Problem List Patient Active Problem List   Diagnosis Date Noted  . Carpal tunnel syndrome, right 09/05/2015    Class: Chronic  .  Trigger finger, acquired 09/05/2015    Class: Chronic  . Pulmonary nodules 01/16/2013  . Dyspnea 01/13/2013    Carey Bullocks, OTR/L 11/15/2015, 11:00 AM  [facility] [address] [place], [place], [address] Phone: [phone]   Fax:  [phone]  Name: Holly [name] MRN: [id number] Date of Birth: 24-May-1961

## 2015-11-17 ENCOUNTER — Encounter: Payer: Self-pay | Admitting: Occupational Therapy

## 2015-11-17 ENCOUNTER — Ambulatory Visit: Payer: BC Managed Care – PPO | Admitting: Occupational Therapy

## 2015-11-17 DIAGNOSIS — R200 Anesthesia of skin: Secondary | ICD-10-CM

## 2015-11-17 DIAGNOSIS — M79641 Pain in right hand: Secondary | ICD-10-CM | POA: Diagnosis not present

## 2015-11-17 DIAGNOSIS — R29898 Other symptoms and signs involving the musculoskeletal system: Secondary | ICD-10-CM

## 2015-11-17 NOTE — Therapy (Signed)
[facility] [address] [place], [place], [address] Phone: [phone]   Fax:  [phone]  Occupational Therapy Treatment  Patient Details  Name: Holly [name] MRN: [id number] Date of Birth: 19-Oct-1960 Referring Provider: Dr. Basil Dess  Encounter Date: 11/17/2015      OT End of Session - 11/17/15 1641    Visit Number 6   Number of Visits 17   Date for OT Re-Evaluation 11/30/15   Authorization Type [employer] State/Blue MCR - G CODE!   Authorization - Visit Number 6   Authorization - Number of Visits 10   OT Start Time 1450   OT Stop Time 1530   OT Time Calculation (min) 40 min   Activity Tolerance Patient tolerated treatment well      Past Medical History  Diagnosis Date  . Migraine   . Sleep apnea   . Anxiety     takes Ativan daily  . Depression     takes Cymbalta daily  . GERD (gastroesophageal reflux disease)     takes Prevacid daily  . Diabetes ([facility])     takes Metformin daily  . Hypothyroidism     takes Synthroid daily  . Thoracic outlet syndrome   . Lung nodule     right middle lobe-was being followed by Dr.Burney.Medical Md is keeping up with this  . History of bronchitis     > 5 yrs ago  . Migraine     last one 08/28/15  . Numbness and tingling in hands   . Joint pain   . Bone spur     neck and buldging disc  . Back pain     stenosis and buldging disc  . History of hiatal hernia   . Diverticulosis   . IBS (irritable bowel syndrome)   . Urinary urgency   . Nocturia   . Insomnia     doesn't take any meds    Past Surgical History  Procedure Laterality Date  . Thyroidectomy    . Cholecystectomy    . Tonsillectomy      adenoidectomy  . Abdominal exploration surgery      cancer cells in cervix  . Colonoscopy    . Esophagogastroduodenoscopy    . Carpal tunnel release Right 09/05/2015    Procedure: RIGHT OPEN CARPAL TUNNEL RELEASE, RIGHT LONG FINGER TRIGGER RELEASE,RIGHT INDEX  FINGER TRIGGER RELEASE;  Surgeon: Jessy Oto, MD;  Location: [facility];  Service: Orthopedics;  Laterality: Right;  . Trigger finger release Right 09/05/2015    Procedure: RELEASE TRIGGER FINGER/A-1 PULLEY;  Surgeon: Jessy Oto, MD;  Location: [facility];  Service: Orthopedics;  Laterality: Right;    There were no vitals filed for this visit.  Visit Diagnosis:  Weakness of right hand  Numbness of left hand      Subjective Assessment - 11/17/15 1523    Subjective  My hands are not hurting right now, it's just tight feeling   Patient is accompained by: Family member   Pertinent History CTS and trigger finger Lt hand as well, cervical buldging disc, stenosis, thoracic outlet syndrome   Patient Stated Goals I want my Rt hand back to normal before doing surgery on the Lt hand   Currently in Pain? No/denies                      OT Treatments/Exercises (OP) - 11/17/15 0001    ADLs   ADL Comments Assessed LTG's and progress to  date. Agrees to d/c next session due to meeting goals. Also discussed pain management for arthritis - pt beginning to have joint changes at DIP joints bilateral hands.    Hand Exercises   Other Hand Exercises Upgraded putty to green resistance and reviewed each exercise. Pt performed mass grasp x 15 reps, and tip pinch x 10 reps each finger.    Other Hand Exercises Worked on 3 tip pinch strength Rt hand - green to blue resistance clothespins to place on antenna and retrieve without difficulty.    Ultrasound   Ultrasound Location both incision sites Rt hand (at wrist and distal palm) x 5 min each sit = 10 min total   Ultrasound Parameters 3 Mhz, continuous, 0.8 wts/cm2, with no adverse reactions   Ultrasound Goals --  scar tissue management around incisions                  OT Short Term Goals - 11/15/15 1045    OT SHORT TERM GOAL #1   Title independent with initial HEP (All STG's due 11/03/15)    Time 4   Period Weeks   Status Achieved   OT  SHORT TERM GOAL #2   Title Pt to id pain management strategies for hands including modalities, stretches, exercises and scar massage/management   Time 4   Period Weeks   Status Achieved   OT SHORT TERM GOAL #3   Title Improve grip strength Rt hand by 10 lbs or greater   Baseline eval = 28 lbs   Time 4   Period Weeks   Status Achieved  11/14/15: Rt grip = 80 lbs   OT SHORT TERM GOAL #4   Title Pain less than or equal to 3/10 Rt hand with BADLS and exercises   Baseline up to 6/10    Time 4   Period Weeks   Status Achieved           OT Long Term Goals - 11/17/15 1648    OT LONG TERM GOAL #1   Title Independent with updated HEP (All LTG's due 11/30/15)   Time 8   Period Weeks   Status Achieved   OT LONG TERM GOAL #2   Title Improve grip strength Rt hand by 20 lbs for opening jars/containers   Baseline eval = 28 lbs   Time 8   Period Weeks   Status Achieved  80 LBS   OT LONG TERM GOAL #3   Title Improve lateral and pinch strength Rt hand by at least 3 lbs   Baseline eval : lateral = 14, 3 tip = 13   Time 8   Period Weeks   Status Achieved  11/17/15: Lateral and 3 tip pinch = 20 lbs each   OT LONG TERM GOAL #4   Title Pt to return to using Rt hand at PLOF in prep for surgery to Lt hand   Time 8   Period Weeks   Status Achieved               Plan - 11/17/15 1644    Clinical Impression Statement Pt met all LTG's and returned to PLOF with Rt dominant hand.    Plan D/C next session - will need G-CODE! Try paraffin for arthritis management   OT Home Exercise Plan Education issued:  HEP 10/20/15 (yellow putty, tendon glides, thumb flex)   Consulted and Agree with Plan of Care Patient;Family member/caregiver   Family Member Consulted husband  Problem List Patient Active Problem List   Diagnosis Date Noted  . Carpal tunnel syndrome, right 09/05/2015    Class: Chronic  . Trigger finger, acquired 09/05/2015    Class: Chronic  . Pulmonary nodules  01/16/2013  . Dyspnea 01/13/2013    Carey Bullocks, OTR/L 11/17/2015, 4:49 PM  [facility] [address] [place], [place], [address] Phone: [phone]   Fax:  [phone]  Name: Holly [name] MRN: [id number] Date of Birth: [birth year]

## 2015-11-22 ENCOUNTER — Ambulatory Visit: Payer: BC Managed Care – PPO | Admitting: Occupational Therapy

## 2015-11-25 ENCOUNTER — Encounter: Payer: BC Managed Care – PPO | Admitting: Occupational Therapy

## 2015-11-30 ENCOUNTER — Encounter: Payer: BC Managed Care – PPO | Admitting: Occupational Therapy

## 2015-12-02 ENCOUNTER — Encounter: Payer: BC Managed Care – PPO | Admitting: Occupational Therapy

## 2015-12-06 ENCOUNTER — Encounter: Payer: BC Managed Care – PPO | Admitting: Occupational Therapy

## 2015-12-07 ENCOUNTER — Encounter: Payer: Self-pay | Admitting: Occupational Therapy

## 2015-12-07 NOTE — Therapy (Signed)
[facility] [address] [place], [place], [address] Phone: [phone]   Fax:  [phone]  Patient Details  Name: Holly [name] MRN: [id number] Date of Birth: April 18, 1961 Referring Provider:  No ref. provider found  Encounter Date: 12/07/2015  OCCUPATIONAL THERAPY DISCHARGE SUMMARY  Visits from Start of Care: 6  Current functional level related to goals / functional outcomes:     OT Short Term Goals - 11/15/15 1045    OT SHORT TERM GOAL #1   Title independent with initial HEP (All STG's due 11/03/15)    Time 4   Period Weeks   Status Achieved   OT SHORT TERM GOAL #2   Title Pt to id pain management strategies for hands including modalities, stretches, exercises and scar massage/management   Time 4   Period Weeks   Status Achieved   OT SHORT TERM GOAL #3   Title Improve grip strength Rt hand by 10 lbs or greater   Baseline eval = 28 lbs   Time 4   Period Weeks   Status Achieved  11/14/15: Rt grip = 80 lbs   OT SHORT TERM GOAL #4   Title Pain less than or equal to 3/10 Rt hand with BADLS and exercises   Baseline up to 6/10    Time 4   Period Weeks   Status Achieved         OT Long Term Goals - 11/17/15 1648    OT LONG TERM GOAL #1   Title Independent with updated HEP (All LTG's due 11/30/15)   Time 8   Period Weeks   Status Achieved   OT LONG TERM GOAL #2   Title Improve grip strength Rt hand by 20 lbs for opening jars/containers   Baseline eval = 28 lbs   Time 8   Period Weeks   Status Achieved  80 LBS   OT LONG TERM GOAL #3   Title Improve lateral and pinch strength Rt hand by at least 3 lbs   Baseline eval : lateral = 14, 3 tip = 13   Time 8   Period Weeks   Status Achieved  11/17/15: Lateral and 3 tip pinch = 20 lbs each   OT LONG TERM GOAL #4   Title Pt to return to using Rt hand at PLOF in prep for surgery to Lt hand   Time 8   Period Weeks   Status Achieved        Remaining  deficits: NONE   Education / Equipment: HEP, arthritis management, A/E recommendations  Plan: Patient agrees to discharge.  Patient goals were met. Patient is being discharged due to meeting the stated rehab goals.  ?????       Carey Bullocks, OTR/L 12/07/2015, 1:23 PM  [facility] [address] [place], [place], [address] Phone: [phone]   Fax:  [phone]

## 2015-12-09 DIAGNOSIS — M542 Cervicalgia: Secondary | ICD-10-CM | POA: Diagnosis not present

## 2015-12-09 DIAGNOSIS — M546 Pain in thoracic spine: Secondary | ICD-10-CM | POA: Diagnosis not present

## 2015-12-09 DIAGNOSIS — M5412 Radiculopathy, cervical region: Secondary | ICD-10-CM | POA: Diagnosis not present

## 2015-12-09 DIAGNOSIS — G894 Chronic pain syndrome: Secondary | ICD-10-CM | POA: Diagnosis not present

## 2015-12-09 DIAGNOSIS — G890 Central pain syndrome: Secondary | ICD-10-CM | POA: Diagnosis not present

## 2015-12-20 DIAGNOSIS — R300 Dysuria: Secondary | ICD-10-CM | POA: Diagnosis not present

## 2015-12-20 DIAGNOSIS — E7800 Pure hypercholesterolemia, unspecified: Secondary | ICD-10-CM | POA: Diagnosis not present

## 2015-12-20 DIAGNOSIS — E039 Hypothyroidism, unspecified: Secondary | ICD-10-CM | POA: Diagnosis not present

## 2015-12-20 DIAGNOSIS — G4730 Sleep apnea, unspecified: Secondary | ICD-10-CM | POA: Diagnosis not present

## 2015-12-20 DIAGNOSIS — Z7984 Long term (current) use of oral hypoglycemic drugs: Secondary | ICD-10-CM | POA: Diagnosis not present

## 2015-12-20 DIAGNOSIS — E119 Type 2 diabetes mellitus without complications: Secondary | ICD-10-CM | POA: Diagnosis not present

## 2016-01-19 DIAGNOSIS — G43719 Chronic migraine without aura, intractable, without status migrainosus: Secondary | ICD-10-CM | POA: Diagnosis not present

## 2016-01-23 DIAGNOSIS — N952 Postmenopausal atrophic vaginitis: Secondary | ICD-10-CM | POA: Diagnosis not present

## 2016-01-23 DIAGNOSIS — Z1231 Encounter for screening mammogram for malignant neoplasm of breast: Secondary | ICD-10-CM | POA: Diagnosis not present

## 2016-01-23 DIAGNOSIS — Z01411 Encounter for gynecological examination (general) (routine) with abnormal findings: Secondary | ICD-10-CM | POA: Diagnosis not present

## 2016-01-23 DIAGNOSIS — N950 Postmenopausal bleeding: Secondary | ICD-10-CM | POA: Diagnosis not present

## 2016-01-23 DIAGNOSIS — R87619 Unspecified abnormal cytological findings in specimens from cervix uteri: Secondary | ICD-10-CM | POA: Diagnosis not present

## 2016-01-23 DIAGNOSIS — Z1151 Encounter for screening for human papillomavirus (HPV): Secondary | ICD-10-CM | POA: Diagnosis not present

## 2016-01-26 ENCOUNTER — Other Ambulatory Visit: Payer: Self-pay | Admitting: Obstetrics

## 2016-01-26 DIAGNOSIS — E2839 Other primary ovarian failure: Secondary | ICD-10-CM

## 2016-02-24 DIAGNOSIS — N952 Postmenopausal atrophic vaginitis: Secondary | ICD-10-CM | POA: Diagnosis not present

## 2016-02-24 DIAGNOSIS — N950 Postmenopausal bleeding: Secondary | ICD-10-CM | POA: Diagnosis not present

## 2016-03-07 DIAGNOSIS — G603 Idiopathic progressive neuropathy: Secondary | ICD-10-CM | POA: Diagnosis not present

## 2016-03-07 DIAGNOSIS — M5412 Radiculopathy, cervical region: Secondary | ICD-10-CM | POA: Diagnosis not present

## 2016-03-07 DIAGNOSIS — G541 Lumbosacral plexus disorders: Secondary | ICD-10-CM | POA: Diagnosis not present

## 2016-03-07 DIAGNOSIS — M546 Pain in thoracic spine: Secondary | ICD-10-CM | POA: Diagnosis not present

## 2016-03-08 ENCOUNTER — Other Ambulatory Visit: Payer: BC Managed Care – PPO

## 2016-03-14 DIAGNOSIS — G4733 Obstructive sleep apnea (adult) (pediatric): Secondary | ICD-10-CM | POA: Diagnosis not present

## 2016-03-15 DIAGNOSIS — G4733 Obstructive sleep apnea (adult) (pediatric): Secondary | ICD-10-CM | POA: Diagnosis not present

## 2016-03-19 DIAGNOSIS — E039 Hypothyroidism, unspecified: Secondary | ICD-10-CM | POA: Diagnosis not present

## 2016-03-19 DIAGNOSIS — E1165 Type 2 diabetes mellitus with hyperglycemia: Secondary | ICD-10-CM | POA: Diagnosis not present

## 2016-03-19 DIAGNOSIS — R21 Rash and other nonspecific skin eruption: Secondary | ICD-10-CM | POA: Diagnosis not present

## 2016-03-19 DIAGNOSIS — E7800 Pure hypercholesterolemia, unspecified: Secondary | ICD-10-CM | POA: Diagnosis not present

## 2016-03-19 DIAGNOSIS — R5383 Other fatigue: Secondary | ICD-10-CM | POA: Diagnosis not present

## 2016-03-23 ENCOUNTER — Ambulatory Visit
Admission: RE | Admit: 2016-03-23 | Discharge: 2016-03-23 | Disposition: A | Discharge status: Home / Self Care | Payer: BC Managed Care – PPO | Source: Ambulatory Visit | Attending: Obstetrics | Admitting: Obstetrics

## 2016-03-23 DIAGNOSIS — E2839 Other primary ovarian failure: Secondary | ICD-10-CM

## 2016-03-28 DIAGNOSIS — G4733 Obstructive sleep apnea (adult) (pediatric): Secondary | ICD-10-CM | POA: Diagnosis not present

## 2016-04-12 DIAGNOSIS — G43719 Chronic migraine without aura, intractable, without status migrainosus: Secondary | ICD-10-CM | POA: Diagnosis not present

## 2016-04-30 DIAGNOSIS — H16202 Unspecified keratoconjunctivitis, left eye: Secondary | ICD-10-CM | POA: Diagnosis not present

## 2016-05-03 DIAGNOSIS — G4733 Obstructive sleep apnea (adult) (pediatric): Secondary | ICD-10-CM | POA: Diagnosis not present

## 2016-05-05 ENCOUNTER — Emergency Department (HOSPITAL_COMMUNITY): Payer: BC Managed Care – PPO

## 2016-05-05 ENCOUNTER — Encounter (HOSPITAL_COMMUNITY): Payer: Self-pay

## 2016-05-05 ENCOUNTER — Emergency Department (HOSPITAL_COMMUNITY)
Admission: EM | Admit: 2016-05-05 | Discharge: 2016-05-06 | Disposition: A | Discharge status: Home / Self Care | Payer: BC Managed Care – PPO | Attending: Emergency Medicine | Admitting: Emergency Medicine

## 2016-05-05 DIAGNOSIS — M25552 Pain in left hip: Secondary | ICD-10-CM | POA: Insufficient documentation

## 2016-05-05 DIAGNOSIS — Y999 Unspecified external cause status: Secondary | ICD-10-CM | POA: Insufficient documentation

## 2016-05-05 DIAGNOSIS — E039 Hypothyroidism, unspecified: Secondary | ICD-10-CM | POA: Diagnosis not present

## 2016-05-05 DIAGNOSIS — Z7984 Long term (current) use of oral hypoglycemic drugs: Secondary | ICD-10-CM | POA: Insufficient documentation

## 2016-05-05 DIAGNOSIS — Y92009 Unspecified place in unspecified non-institutional (private) residence as the place of occurrence of the external cause: Secondary | ICD-10-CM | POA: Insufficient documentation

## 2016-05-05 DIAGNOSIS — R51 Headache: Secondary | ICD-10-CM | POA: Diagnosis not present

## 2016-05-05 DIAGNOSIS — Z79899 Other long term (current) drug therapy: Secondary | ICD-10-CM | POA: Diagnosis not present

## 2016-05-05 DIAGNOSIS — M25522 Pain in left elbow: Secondary | ICD-10-CM | POA: Diagnosis not present

## 2016-05-05 DIAGNOSIS — Y9301 Activity, walking, marching and hiking: Secondary | ICD-10-CM | POA: Diagnosis not present

## 2016-05-05 DIAGNOSIS — W19XXXA Unspecified fall, initial encounter: Secondary | ICD-10-CM | POA: Diagnosis not present

## 2016-05-05 DIAGNOSIS — M542 Cervicalgia: Secondary | ICD-10-CM | POA: Diagnosis not present

## 2016-05-05 DIAGNOSIS — E119 Type 2 diabetes mellitus without complications: Secondary | ICD-10-CM | POA: Insufficient documentation

## 2016-05-05 LAB — CBC WITH DIFFERENTIAL/PLATELET
BASOS ABS: 0.1 10*3/uL (ref 0.0–0.1)
BASOS PCT: 1 %
EOS PCT: 4 %
Eosinophils Absolute: 0.3 10*3/uL (ref 0.0–0.7)
HEMATOCRIT: 37.7 % (ref 36.0–46.0)
Hemoglobin: 13.4 g/dL (ref 12.0–15.0)
Lymphocytes Relative: 50 %
Lymphs Abs: 3.4 10*3/uL (ref 0.7–4.0)
MCH: 29.9 pg (ref 26.0–34.0)
MCHC: 35.5 g/dL (ref 30.0–36.0)
MCV: 84.2 fL (ref 78.0–100.0)
MONO ABS: 0.3 10*3/uL (ref 0.1–1.0)
Monocytes Relative: 5 %
NEUTROS ABS: 2.7 10*3/uL (ref 1.7–7.7)
Neutrophils Relative %: 40 %
PLATELETS: 140 10*3/uL — AB (ref 150–400)
RBC: 4.48 MIL/uL (ref 3.87–5.11)
RDW: 12.6 % (ref 11.5–15.5)
WBC: 6.7 10*3/uL (ref 4.0–10.5)

## 2016-05-05 LAB — COMPREHENSIVE METABOLIC PANEL
ALBUMIN: 4.1 g/dL (ref 3.5–5.0)
ALT: 35 U/L (ref 14–54)
AST: 33 U/L (ref 15–41)
Alkaline Phosphatase: 69 U/L (ref 38–126)
Anion gap: 5 (ref 5–15)
BUN: 11 mg/dL (ref 6–20)
CHLORIDE: 101 mmol/L (ref 101–111)
CO2: 29 mmol/L (ref 22–32)
Calcium: 9.1 mg/dL (ref 8.9–10.3)
Creatinine, Ser: 0.77 mg/dL (ref 0.44–1.00)
GFR calc Af Amer: >60 mL/min (ref >60)
GFR calc non Af Amer: >60 mL/min (ref >60)
GLUCOSE: 109 mg/dL — AB (ref 65–99)
POTASSIUM: 3.7 mmol/L (ref 3.5–5.1)
Sodium: 135 mmol/L (ref 135–145)
Total Bilirubin: 0.4 mg/dL (ref 0.3–1.2)
Total Protein: 7.2 g/dL (ref 6.5–8.1)

## 2016-05-05 LAB — I-STAT TROPONIN, ED: Troponin i, poc: 0 ng/mL (ref 0.00–0.08)

## 2016-05-05 NOTE — Discharge Instructions (Signed)
Your x-rays and CT does not show serious injury.  Continue to take ibuprofen and tylenol for pain, ice and heat therapy as needed.  Return for worsening symptoms, including confusion, numbness/weakness, difficulty walking, passing out, chest pain or difficulty breathing, or any other symptoms concerning to you.

## 2016-05-05 NOTE — ED Notes (Signed)
Pt returned from xray/ct 

## 2016-05-05 NOTE — ED Notes (Signed)
Patient transported to CT 

## 2016-05-05 NOTE — ED Notes (Signed)
Report  From Myrtha Mantis, RN

## 2016-05-05 NOTE — ED Notes (Signed)
MD at bedside. 

## 2016-05-05 NOTE — ED Triage Notes (Signed)
Husband states wife stood up and was walking in the house and fell backwards. Patient denies remembering anything. Complains of headache, neck pain, left hip and left arm pain. Ambulatory to triage.

## 2016-05-05 NOTE — ED Provider Notes (Signed)
[facility]-EMERGENCY DEPT Provider Note   CSN: [id number] Arrival date & time: 05/05/16  1956  First Provider Contact:   First MD Initiated Contact with Patient 05/05/16 2040      By signing my name below, I, Majel Homer, attest that this documentation has been prepared under the direction and in the presence of Lavera Guise, MD . Electronically Signed: Majel Homer, Scribe. 05/05/2016. 9:02 PM.  History   Chief Complaint Chief Complaint  Patient presents with  . Fall   The history is provided by the patient. No language interpreter was used.   HPI Comments: Holly [name] is a 55 y.o. female with PMHx of migraines and DM, who presents to the Emergency Department complaining of gradually worsening, pain to her left hip, left elbow, neck and head s/p a fall that occurred a few hours PTA.  Pt reports she got up from her chair and walked into the kitchen when she fell backwards; she remembers standing on her left foot before her fall but states she cannot remember anything after that. She states the last thing she remembers is laying on the ground after her fall. Per husband, it looked as if an "invisible person dragged her down;" he notes pt did not lose consciousness or have syncope, and was in the room, witnessing her fall. She denies feeling lightheaded, unwell, chest pain or shortness of breath before her fall. She states she was able to get up on her own and ambulate after her fall. Pt notes she went outside and became dizzy at ~3 PM this afternoon in which she fell but her husband caught her. She denies dizziness when she got up out of her chair before her fall this evening. She also denies lower back pain, abdominal pain and changes in appetite. Pt reports a hx of kidney cancer in her mother in which she often also experienced spontaneous falls backwards.   Past Medical History:  Diagnosis Date  . Anxiety    takes Ativan daily  . Back pain    stenosis and buldging disc  . Bone spur    neck and  buldging disc  . Depression    takes Cymbalta daily  . Diabetes ([facility])    takes Metformin daily  . Diverticulosis   . GERD (gastroesophageal reflux disease)    takes Prevacid daily  . History of bronchitis    > 5 yrs ago  . History of hiatal hernia   . Hypothyroidism    takes Synthroid daily  . IBS (irritable bowel syndrome)   . Insomnia    doesn't take any meds  . Joint pain   . Lung nodule    right middle lobe-was being followed by Dr.Burney.Medical Md is keeping up with this  . Migraine   . Migraine    last one 08/28/15  . Nocturia   . Numbness and tingling in hands   . Sleep apnea   . Thoracic outlet syndrome   . Urinary urgency     Patient Active Problem List   Diagnosis Date Noted  . Carpal tunnel syndrome, right 09/05/2015    Class: Chronic  . Trigger finger, acquired 09/05/2015    Class: Chronic  . Pulmonary nodules 01/16/2013  . Dyspnea 01/13/2013    Past Surgical History:  Procedure Laterality Date  . ABDOMINAL EXPLORATION SURGERY     cancer cells in cervix  . CARPAL TUNNEL RELEASE Right 09/05/2015   Procedure: RIGHT OPEN CARPAL TUNNEL RELEASE, RIGHT LONG FINGER TRIGGER RELEASE,RIGHT INDEX  FINGER TRIGGER RELEASE;  Surgeon: Kerrin Champagne, MD;  Location: [facility] OR;  Service: Orthopedics;  Laterality: Right;  . CHOLECYSTECTOMY    . COLONOSCOPY    . ESOPHAGOGASTRODUODENOSCOPY    . THYROIDECTOMY    . TONSILLECTOMY     adenoidectomy  . TRIGGER FINGER RELEASE Right 09/05/2015   Procedure: RELEASE TRIGGER FINGER/A-1 PULLEY;  Surgeon: Kerrin Champagne, MD;  Location: [facility] OR;  Service: Orthopedics;  Laterality: Right;    OB History    No data available     Home Medications    Prior to Admission medications   Medication Sig Start Date End Date Taking? Authorizing Provider  DULoxetine (CYMBALTA) 20 MG capsule Take 20 mg by mouth every evening.    Yes Historical Provider, MD  famotidine (PEPCID) 20 MG tablet Take 20 mg by mouth 2 (two) times daily.   Yes Historical  Provider, MD  lansoprazole (PREVACID) 30 MG capsule Take 30 mg by mouth 2 (two) times daily.   Yes Historical Provider, MD  LORazepam (ATIVAN) 2 MG tablet Take 2 mg by mouth 4 (four) times daily.   Yes Historical Provider, MD  metFORMIN (GLUCOPHAGE) 500 MG tablet Take 1,000 mg by mouth 2 (two) times daily with a meal.    Yes Historical Provider, MD  SUBOXONE 8-2 MG FILM Place 3 Film under the tongue daily.  08/12/15  Yes Historical Provider, MD  SYNTHROID 112 MCG tablet Take 112 mcg by mouth daily. 04/20/16  Yes Historical Provider, MD    Family History Family History  Problem Relation Age of Onset  . COPD Mother   . Cancer Mother   . Heart disease Paternal Grandmother   . Heart disease Paternal Grandfather   . Heart disease Maternal Grandmother   . Heart disease Maternal Grandfather     Social History Social History  Substance Use Topics  . Smoking status: Never Smoker  . Smokeless tobacco: Never Used  . Alcohol use No     Allergies   Triptans   Review of Systems Review of Systems 10/14 systems reviewed and all are negative for acute change except as noted in the HPI.  Physical Exam Updated Vital Signs BP 126/75 (BP Location: Right Arm)   Pulse 92   Temp 98.1 F (36.7 C) (Oral)   Resp 20   Ht 5\' 5"  (1.651 m)   Wt 205 lb (93 kg)   SpO2 97%   BMI 34.11 kg/m   Physical Exam Physical Exam  Nursing note and vitals reviewed. Constitutional: Well developed, well nourished, non-toxic, and in no acute distress Head: Normocephalic and atraumatic.  Mouth/Throat: Oropharynx is clear and moist.  Neck: Normal range of motion. Neck supple.  Cardiovascular: Normal rate and regular rhythm.   Pulmonary/Chest: Effort normal and breath sounds normal.  Abdominal: Soft. There is no tenderness. There is no rebound and no guarding.  Musculoskeletal: Normal range of motion.  Skin: Skin is warm and dry.  Psychiatric: Cooperative Neurological:  Alert, oriented to person, place,  time, and situation. Memory grossly in tact. Fluent speech. No dysarthria or aphasia.  Cranial nerves: VF are full. Fundoscopic exam-unable to get good visualization of the discs. Pupils are symmetric, and reactive to light. EOMI without nystagmus. No gaze deviation. Facial muscles symmetric with activation. Sensation to light touch over face in tact bilaterally. Hearing grossly in tact. Palate elevates symmetrically. Head turn and shoulder shrug are intact. Tongue midline.  Reflexes defered.  Muscle bulk and tone normal. No pronator drift. Moves all  extremities symmetrically. Sensation to light touch is in tact throughout in bilateral upper and lower extremities. Coordination reveals no dysmetria with finger to nose. Gait is narrow-based and steady. Non-ataxic.  ED Treatments / Results  Labs (all labs ordered are listed, but only abnormal results are displayed) Labs Reviewed  CBC WITH DIFFERENTIAL/PLATELET - Abnormal; Notable for the following:       Result Value   Platelets 140 (*)    All other components within normal limits  COMPREHENSIVE METABOLIC PANEL - Abnormal; Notable for the following:    Glucose, Bld 109 (*)    All other components within normal limits  I-STAT TROPOININ, ED    EKG  EKG Interpretation  Date/Time:  Saturday May 05 2016 21:16:05 EDT Ventricular Rate:  83 PR Interval:    QRS Duration: 85 QT Interval:  381 QTC Calculation: 448 R Axis:   24 Text Interpretation:  Sinus rhythm No acute changes compared to prior EKg  Confirmed by Celica Kotowski MD, Lidie Glade [phone]) on 05/05/2016 10:42:35 PM       Radiology Dg Elbow Complete Left  Result Date: 05/05/2016 CLINICAL DATA:  Fall while walking in her house.  Left elbow pain. EXAM: LEFT ELBOW - COMPLETE 3+ VIEW COMPARISON:  None. FINDINGS: There is no evidence of fracture, dislocation, or joint effusion. Trace osteoarthritis, no evidence of inflammatory arthropathy or other focal bone abnormality. Soft tissues are unremarkable.  IMPRESSION: No acute fracture or subluxation of the left elbow. Electronically Signed   By: Rubye Oaks M.D.   On: 05/05/2016 22:14   Ct Head Wo Contrast  Result Date: 05/05/2016 CLINICAL DATA:  55 year old female with fall and severe headache and neck pain EXAM: CT HEAD WITHOUT CONTRAST CT CERVICAL SPINE WITHOUT CONTRAST TECHNIQUE: Multidetector CT imaging of the head and cervical spine was performed following the standard protocol without intravenous contrast. Multiplanar CT image reconstructions of the cervical spine were also generated. COMPARISON:  Cervical spine CT dated 01/11/2014 FINDINGS: CT HEAD FINDINGS The ventricles and the sulci are appropriate in size for the patient's age. There is no intracranial hemorrhage. No midline shift or mass effect identified. The gray-white matter differentiation is preserved. The visualized paranasal sinuses and mastoid air cells are well aerated. The calvarium is intact. CT CERVICAL SPINE FINDINGS There is no acute fracture or subluxation of the cervical spine.There is mild degenerative changes and straightening of the normal cervical lordosis.The odontoid and spinous processes are intact.There is normal anatomic alignment of the C1-C2 lateral masses. The visualized soft tissues appear unremarkable. IMPRESSION: No acute intracranial pathology. No acute/ traumatic cervical spine pathology. Electronically Signed   By: Elgie Collard M.D.   On: 05/05/2016 21:56   Ct Cervical Spine Wo Contrast  Result Date: 05/05/2016 CLINICAL DATA:  55 year old female with fall and severe headache and neck pain EXAM: CT HEAD WITHOUT CONTRAST CT CERVICAL SPINE WITHOUT CONTRAST TECHNIQUE: Multidetector CT imaging of the head and cervical spine was performed following the standard protocol without intravenous contrast. Multiplanar CT image reconstructions of the cervical spine were also generated. COMPARISON:  Cervical spine CT dated 01/11/2014 FINDINGS: CT HEAD FINDINGS The  ventricles and the sulci are appropriate in size for the patient's age. There is no intracranial hemorrhage. No midline shift or mass effect identified. The gray-white matter differentiation is preserved. The visualized paranasal sinuses and mastoid air cells are well aerated. The calvarium is intact. CT CERVICAL SPINE FINDINGS There is no acute fracture or subluxation of the cervical spine.There is mild degenerative changes and straightening  of the normal cervical lordosis.The odontoid and spinous processes are intact.There is normal anatomic alignment of the C1-C2 lateral masses. The visualized soft tissues appear unremarkable. IMPRESSION: No acute intracranial pathology. No acute/ traumatic cervical spine pathology. Electronically Signed   By: Elgie Collard M.D.   On: 05/05/2016 21:56   Dg Hip Unilat W Or Wo Pelvis 2-3 Views Left  Result Date: 05/05/2016 CLINICAL DATA:  Fall today while walking in her house. Left hip pain. EXAM: DG HIP (WITH OR WITHOUT PELVIS) 2-3V LEFT COMPARISON:  None. FINDINGS: The cortical margins of the bony pelvis and left hip are intact. No fracture. Pubic symphysis and sacroiliac joints are congruent. Mild degenerative change of both hips. Both femoral heads are well-seated in the respective acetabula. IMPRESSION: No acute fracture of the pelvis or left hip. Electronically Signed   By: Rubye Oaks M.D.   On: 05/05/2016 22:15   Procedures Procedures  DIAGNOSTIC STUDIES:  Oxygen Saturation is 97% on RA, normal by my interpretation.    COORDINATION OF CARE:  9:54 PM Discussed treatment plan with pt at bedside and pt agreed to plan.   Medications Ordered in ED Medications - No data to display   Initial Impression / Assessment and Plan / ED Course  I have reviewed the triage vital signs and the nursing notes.  Pertinent labs & imaging results that were available during my care of the patient were reviewed by me and considered in my medical decision making (see  chart for details).  Clinical Course    55 year old female who presents after fall. Unclear mechanism, but as was witnessed by patient's husband she did not have any syncope, loss of consciousness, or seizure. She is well-appearing in no acute distress with normal vital signs. She has a normal neurological exam. CT head and cervical spine without acute traumatic injuries. X-ray of the elbow and hip on the left side also negative for trauma. Able to ambulate steadily. Basic blood work overall unremarkable and EKG non-concerning. Patient is felt to be stable for discharge home. Strict return and follow-up instructions are reviewed. She expressed understanding of all discharge instructions, and felt comfortable with the plan of care.   I personally performed the services described in this documentation, which was scribed in my presence. The recorded information has been reviewed and is accurate.   Final Clinical Impressions(s) / ED Diagnoses   Final diagnoses:  Fall, initial encounter    New Prescriptions New Prescriptions   No medications on file     Lavera Guise, MD 05/06/16 [phone]

## 2016-05-06 NOTE — ED Notes (Signed)
Teachback of discharge instructions. Pt verbalizes understanding of DC instruction, OTC meds, and follow up for any concerns/ Pt and SO report that they have no questions. Pt ambulated heel to toe to exit without hitch in gait or drift

## 2016-06-11 DIAGNOSIS — M791 Myalgia: Secondary | ICD-10-CM | POA: Diagnosis not present

## 2016-06-11 DIAGNOSIS — H6091 Unspecified otitis externa, right ear: Secondary | ICD-10-CM | POA: Diagnosis not present

## 2016-06-11 DIAGNOSIS — J00 Acute nasopharyngitis [common cold]: Secondary | ICD-10-CM | POA: Diagnosis not present

## 2016-06-15 DIAGNOSIS — G5760 Lesion of plantar nerve, unspecified lower limb: Secondary | ICD-10-CM | POA: Diagnosis not present

## 2016-07-12 ENCOUNTER — Ambulatory Visit (INDEPENDENT_AMBULATORY_CARE_PROVIDER_SITE_OTHER): Payer: BC Managed Care – PPO | Admitting: Orthopedic Surgery

## 2016-08-22 ENCOUNTER — Other Ambulatory Visit: Payer: Self-pay | Admitting: *Deleted

## 2016-08-22 DIAGNOSIS — E559 Vitamin D deficiency, unspecified: Secondary | ICD-10-CM

## 2016-08-23 LAB — VITAMIN D 25 HYDROXY (VIT D DEFICIENCY, FRACTURES): Vit D, 25-Hydroxy: 71 ng/mL (ref 30–100)

## 2016-08-24 NOTE — Progress Notes (Signed)
Normal

## 2016-08-27 ENCOUNTER — Telehealth: Payer: Self-pay | Admitting: Radiology

## 2016-08-27 NOTE — Telephone Encounter (Signed)
I have called patient to advise Vit D is normal

## 2016-08-27 NOTE — Telephone Encounter (Signed)
Thank you I have advised her.  

## 2016-08-27 NOTE — Telephone Encounter (Signed)
-----   Message from Pollyann SavoyShaili Deveshwar, MD sent at 08/24/2016  5:09 PM EST ----- Normal

## 2016-08-27 NOTE — Telephone Encounter (Signed)
Okay to discontinue vitamin D for right now. She should see her PCP

## 2016-08-27 NOTE — Telephone Encounter (Signed)
I spoke to patient she states she has blisters on her joints (hands and elbows) which have appeared since she has been using Vitamin D I have told her I do not think these are related / nor is the Restless leg she complains of.  I have told her to discuss with PCP and see dermatologist for the blisters on her hands/ elbows. Could her c/o restless leg and blisters on hands / elbows be from Vit D use?  She states she has d/c Vit D and symptoms have not improved. I will call her, but she wanted me to ask you about the problems I have discussed with her.

## 2016-09-18 DIAGNOSIS — F5101 Primary insomnia: Secondary | ICD-10-CM | POA: Insufficient documentation

## 2016-09-18 DIAGNOSIS — R5383 Other fatigue: Secondary | ICD-10-CM | POA: Insufficient documentation

## 2016-09-18 DIAGNOSIS — M47812 Spondylosis without myelopathy or radiculopathy, cervical region: Secondary | ICD-10-CM | POA: Insufficient documentation

## 2016-09-18 DIAGNOSIS — M170 Bilateral primary osteoarthritis of knee: Secondary | ICD-10-CM | POA: Insufficient documentation

## 2016-09-18 DIAGNOSIS — E559 Vitamin D deficiency, unspecified: Secondary | ICD-10-CM | POA: Insufficient documentation

## 2016-09-18 DIAGNOSIS — M797 Fibromyalgia: Secondary | ICD-10-CM | POA: Insufficient documentation

## 2016-09-18 NOTE — Progress Notes (Deleted)
Office Visit Note  Patient: Holly CrankerDonna M [name]             Date of Birth: 02/09/1961           MRN: [id number]             PCP: Allean FoundSMITH,CANDACE THIELE, MD Referring: Merri BrunetteSmith, Candace, MD Visit Date: 09/19/2016 Occupation: @GUAROCC @    Subjective:  No chief complaint on file.   History of Present Illness: Holly CrankerDonna M Arboleda is a 55 y.o. female ***   Activities of Daily Living:  Patient reports morning stiffness for *** {minute/hour:19697}.   Patient {ACTIONS;DENIES/REPORTS:21021675::"Denies"} nocturnal pain.  Difficulty dressing/grooming: {ACTIONS;DENIES/REPORTS:21021675::"Denies"} Difficulty climbing stairs: {ACTIONS;DENIES/REPORTS:21021675::"Denies"} Difficulty getting out of chair: {ACTIONS;DENIES/REPORTS:21021675::"Denies"} Difficulty using hands for taps, buttons, cutlery, and/or writing: {ACTIONS;DENIES/REPORTS:21021675::"Denies"}   No Rheumatology ROS completed.   PMFS History:  Patient Active Problem List   Diagnosis Date Noted  . Carpal tunnel syndrome, right 09/05/2015    Class: Chronic  . Trigger finger, acquired 09/05/2015    Class: Chronic  . Pulmonary nodules 01/16/2013  . Dyspnea 01/13/2013    Past Medical History:  Diagnosis Date  . Anxiety    takes Ativan daily  . Back pain    stenosis and buldging disc  . Bone spur    neck and buldging disc  . Depression    takes Cymbalta daily  . Diabetes (HCC)    takes Metformin daily  . Diverticulosis   . GERD (gastroesophageal reflux disease)    takes Prevacid daily  . History of bronchitis    > 5 yrs ago  . History of hiatal hernia   . Hypothyroidism    takes Synthroid daily  . IBS (irritable bowel syndrome)   . Insomnia    doesn't take any meds  . Joint pain   . Lung nodule    right middle lobe-was being followed by Dr.Burney.Medical Md is keeping up with this  . Migraine   . Migraine    last one 08/28/15  . Nocturia   . Numbness and tingling in hands   . Sleep apnea   . Thoracic outlet syndrome     . Urinary urgency     Family History  Problem Relation Age of Onset  . COPD Mother   . Cancer Mother   . Heart disease Paternal Grandmother   . Heart disease Paternal Grandfather   . Heart disease Maternal Grandmother   . Heart disease Maternal Grandfather    Past Surgical History:  Procedure Laterality Date  . ABDOMINAL EXPLORATION SURGERY     cancer cells in cervix  . CARPAL TUNNEL RELEASE Right 09/05/2015   Procedure: RIGHT OPEN CARPAL TUNNEL RELEASE, RIGHT LONG FINGER TRIGGER RELEASE,RIGHT INDEX FINGER TRIGGER RELEASE;  Surgeon: Kerrin ChampagneJames E Nitka, MD;  Location: [facility] OR;  Service: Orthopedics;  Laterality: Right;  . CHOLECYSTECTOMY    . COLONOSCOPY    . ESOPHAGOGASTRODUODENOSCOPY    . THYROIDECTOMY    . TONSILLECTOMY     adenoidectomy  . TRIGGER FINGER RELEASE Right 09/05/2015   Procedure: RELEASE TRIGGER FINGER/A-1 PULLEY;  Surgeon: Kerrin ChampagneJames E Nitka, MD;  Location: [facility] OR;  Service: Orthopedics;  Laterality: Right;   Social History   Social History Narrative  . No narrative on file     Objective: Vital Signs: There were no vitals taken for this visit.   Physical Exam   Musculoskeletal Exam: ***  CDAI Exam: No CDAI exam completed.    Investigation: No additional findings. Lab on 08/22/2016  Component Date Value  Ref Range Status  . Vit D, 25-Hydroxy 08/23/2016 71  30 - 100 ng/mL Final   Comment: Vitamin D Status           25-OH Vitamin D        Deficiency                <20 ng/mL        Insufficiency         20 - 29 ng/mL        Optimal             > or = 30 ng/mL   For 25-OH Vitamin D testing on patients on D2-supplementation and patients for whom quantitation of D2 and D3 fractions is required, the Quest[employer]ureD 25-OH VIT D, (D2,D3), LC/MS/MS is recommended: order code [id number] (patients > 2 yrs).     No results found.  Speciality Comments: No specialty comments available.    Procedures:  No procedures performed Allergies: Triptans   Assessment / Plan:      Visit Diagnoses: Fibromyalgia  Other fatigue  Carpal tunnel syndrome, right  DJD (degenerative joint disease), cervical  Primary osteoarthritis of both knees    Insomnia  Orders: No orders of the defined types were placed in this encounter.  No orders of the defined types were placed in this encounter.   Face-to-face time spent with patient was *** minutes. 50% of time was spent in counseling and coordination of care.  Follow-Up Instructions: No Follow-up on file.   Acey Woodfield, RT

## 2016-09-19 ENCOUNTER — Ambulatory Visit: Payer: Medicare Other | Admitting: Rheumatology

## 2016-10-02 DIAGNOSIS — G4733 Obstructive sleep apnea (adult) (pediatric): Secondary | ICD-10-CM | POA: Diagnosis not present

## 2016-10-11 DIAGNOSIS — J111 Influenza due to unidentified influenza virus with other respiratory manifestations: Secondary | ICD-10-CM | POA: Diagnosis not present

## 2016-10-11 DIAGNOSIS — R05 Cough: Secondary | ICD-10-CM | POA: Diagnosis not present

## 2016-10-13 ENCOUNTER — Emergency Department (HOSPITAL_COMMUNITY): Payer: BC Managed Care – PPO

## 2016-10-13 ENCOUNTER — Encounter (HOSPITAL_COMMUNITY): Payer: Self-pay | Admitting: Emergency Medicine

## 2016-10-13 ENCOUNTER — Emergency Department (HOSPITAL_COMMUNITY)
Admission: EM | Admit: 2016-10-13 | Discharge: 2016-10-13 | Disposition: A | Discharge status: Home / Self Care | Payer: BC Managed Care – PPO | Attending: Emergency Medicine | Admitting: Emergency Medicine

## 2016-10-13 DIAGNOSIS — E039 Hypothyroidism, unspecified: Secondary | ICD-10-CM | POA: Diagnosis not present

## 2016-10-13 DIAGNOSIS — Z7984 Long term (current) use of oral hypoglycemic drugs: Secondary | ICD-10-CM | POA: Insufficient documentation

## 2016-10-13 DIAGNOSIS — Z79899 Other long term (current) drug therapy: Secondary | ICD-10-CM | POA: Diagnosis not present

## 2016-10-13 DIAGNOSIS — E119 Type 2 diabetes mellitus without complications: Secondary | ICD-10-CM | POA: Insufficient documentation

## 2016-10-13 DIAGNOSIS — J40 Bronchitis, not specified as acute or chronic: Secondary | ICD-10-CM | POA: Insufficient documentation

## 2016-10-13 DIAGNOSIS — R05 Cough: Secondary | ICD-10-CM | POA: Diagnosis not present

## 2016-10-13 LAB — RAPID STREP SCREEN (MED CTR MEBANE ONLY): STREPTOCOCCUS, GROUP A SCREEN (DIRECT): NEGATIVE

## 2016-10-13 MED ORDER — IBUPROFEN 400 MG PO TABS
600.0000 mg | ORAL_TABLET | Freq: Once | ORAL | Status: AC
  Administered 2016-10-13: 600 mg via ORAL
  Filled 2016-10-13: qty 2

## 2016-10-13 MED ORDER — AZITHROMYCIN 250 MG PO TABS: ORAL_TABLET | ORAL | 0 refills | Status: DC

## 2016-10-13 MED ORDER — ACETAMINOPHEN 500 MG PO TABS
1000.0000 mg | ORAL_TABLET | Freq: Once | ORAL | Status: AC
  Administered 2016-10-13: 500 mg via ORAL
  Filled 2016-10-13: qty 2

## 2016-10-13 NOTE — ED Triage Notes (Signed)
Pt has been having flu like sysptoms for the past week was seen at [facility] she had a flu test that was negative but has all the s/s of the flu

## 2016-10-13 NOTE — ED Provider Notes (Signed)
[facility] Provider Note   CSN: [id number] Arrival date & time: 10/13/16  0205  Time seesn 02:35 AM   History   Chief Complaint Chief Complaint  Patient presents with  . Influenza    HPI Holly [name] is a 56 y.o. female.  HPI patient reports January 8 she started getting a sore throat and feeling "yucky". She started having fever on January 10. She also has had a dry cough and nasal congestion. She has been having headache when she coughs and also states coughing makes her throat in her central chest hurt. She denies nausea, vomiting, diarrhea. She states tonight she feels dizzy and off balance. She has been taking Alka-Seltzer plus cold since she got sick. She was seen at a walk-in clinic on January 10 and had a negative flu test. However she was given a prescription of Tamiflu was instructed if she wasn't feeling better by January 14 to start taking it. She also was given a pro-air inhaler. She states she has used inhalers in the past but it's been a while. Nobody else is sick at home.  PCP Allean Found, MD   Past Medical History:  Diagnosis Date  . Anxiety    takes Ativan daily  . Back pain    stenosis and buldging disc  . Bone spur    neck and buldging disc  . Depression    takes Cymbalta daily  . Diabetes (HCC)    takes Metformin daily  . Diverticulosis   . GERD (gastroesophageal reflux disease)    takes Prevacid daily  . History of bronchitis    > 5 yrs ago  . History of hiatal hernia   . Hypothyroidism    takes Synthroid daily  . IBS (irritable bowel syndrome)   . Insomnia    doesn't take any meds  . Joint pain   . Lung nodule    right middle lobe-was being followed by Dr.Burney.Medical Md is keeping up with this  . Migraine   . Migraine    last one 08/28/15  . Nocturia   . Numbness and tingling in hands   . Sleep apnea   . Thoracic outlet syndrome   . Urinary urgency     Patient Active Problem List   Diagnosis Date Noted  . DJD  (degenerative joint disease), cervical 09/18/2016  . Other fatigue 09/18/2016  . Fibromyalgia 09/18/2016  . Primary osteoarthritis of both knees 09/18/2016  . Primary insomnia 09/18/2016  . Vitamin D deficiency 09/18/2016  . Carpal tunnel syndrome, right 09/05/2015    Class: Chronic  . Trigger finger, acquired 09/05/2015    Class: Chronic  . Pulmonary nodules 01/16/2013  . Dyspnea 01/13/2013    Past Surgical History:  Procedure Laterality Date  . ABDOMINAL EXPLORATION SURGERY     cancer cells in cervix  . CARPAL TUNNEL RELEASE Right 09/05/2015   Procedure: RIGHT OPEN CARPAL TUNNEL RELEASE, RIGHT LONG FINGER TRIGGER RELEASE,RIGHT INDEX FINGER TRIGGER RELEASE;  Surgeon: Kerrin Champagne, MD;  Location: [facility] OR;  Service: Orthopedics;  Laterality: Right;  . CHOLECYSTECTOMY    . COLONOSCOPY    . ESOPHAGOGASTRODUODENOSCOPY    . THYROIDECTOMY    . TONSILLECTOMY     adenoidectomy  . TRIGGER FINGER RELEASE Right 09/05/2015   Procedure: RELEASE TRIGGER FINGER/A-1 PULLEY;  Surgeon: Kerrin Champagne, MD;  Location: [facility] OR;  Service: Orthopedics;  Laterality: Right;    OB History    No data available       Home  Medications    Prior to Admission medications   Medication Sig Start Date End Date Taking? Authorizing Provider  azithromycin (ZITHROMAX) 250 MG tablet Take 2 po the first day then once a day for the next 4 days. 10/13/16   Devoria Albe, MD  DULoxetine (CYMBALTA) 20 MG capsule Take 20 mg by mouth every evening.     Historical Provider, MD  famotidine (PEPCID) 20 MG tablet Take 20 mg by mouth 2 (two) times daily.    Historical Provider, MD  lansoprazole (PREVACID) 30 MG capsule Take 30 mg by mouth 2 (two) times daily.    Historical Provider, MD  LORazepam (ATIVAN) 2 MG tablet Take 2 mg by mouth 4 (four) times daily.    Historical Provider, MD  metFORMIN (GLUCOPHAGE) 500 MG tablet Take 1,000 mg by mouth 2 (two) times daily with a meal.     Historical Provider, MD  SUBOXONE 8-2 MG FILM Place 3  Film under the tongue daily.  08/12/15   Historical Provider, MD  SYNTHROID 112 MCG tablet Take 112 mcg by mouth daily. 04/20/16   Historical Provider, MD    Family History Family History  Problem Relation Age of Onset  . COPD Mother   . Cancer Mother   . Heart disease Paternal Grandmother   . Heart disease Paternal Grandfather   . Heart disease Maternal Grandmother   . Heart disease Maternal Grandfather     Social History Social History  Substance Use Topics  . Smoking status: Never Smoker  . Smokeless tobacco: Never Used  . Alcohol use No  on disability for chronic migraine   Allergies   Crestor [rosuvastatin calcium] and Triptans   Review of Systems Review of Systems  All other systems reviewed and are negative.    Physical Exam Updated Vital Signs BP 144/86 (BP Location: Right Arm)   Pulse 102   Temp 100.9 F (38.3 C) (Oral)   Resp 18   Ht 5\' 5"  (1.651 m)   Wt 211 lb (95.7 kg)   SpO2 96%   BMI 35.11 kg/m   Vital signs normal except tachycardia   Physical Exam  Constitutional: She is oriented to person, place, and time. She appears well-developed and well-nourished.  Non-toxic appearance. She does not appear ill. No distress.  HENT:  Head: Normocephalic and atraumatic.  Right Ear: External ear normal.  Left Ear: External ear normal.  Nose: Nose normal. No mucosal edema or rhinorrhea.  Mouth/Throat: Mucous membranes are normal. No dental abscesses or uvula swelling.  Dry tongue  Eyes: Conjunctivae and EOM are normal. Pupils are equal, round, and reactive to light.  Neck: Normal range of motion and full passive range of motion without pain. Neck supple.  Cardiovascular: Normal rate, regular rhythm and normal heart sounds.  Exam reveals no gallop and no friction rub.   No murmur heard. Pulmonary/Chest: Effort normal and breath sounds normal. No respiratory distress. She has no wheezes. She has no rhonchi. She has no rales. She exhibits no tenderness and  no crepitus.  Abdominal: Soft. Normal appearance and bowel sounds are normal. She exhibits no distension. There is no tenderness. There is no rebound and no guarding.  Musculoskeletal: Normal range of motion. She exhibits no edema or tenderness.  Moves all extremities well.   Neurological: She is alert and oriented to person, place, and time. She has normal strength. No cranial nerve deficit.  Skin: Skin is warm, dry and intact. No rash noted. No erythema. No pallor.  Psychiatric: She  has a normal mood and affect. Her speech is normal and behavior is normal. Her mood appears not anxious.  Nursing note and vitals reviewed.    ED Treatments / Results  Labs (all labs ordered are listed, but only abnormal results are displayed) Results for orders placed or performed during the hospital encounter of 10/13/16  Rapid strep screen  Result Value Ref Range   Streptococcus, Group A Screen (Direct) NEGATIVE NEGATIVE   Laboratory interpretation all normal    EKG  EKG Interpretation None       Radiology Dg Chest 2 View  Result Date: 10/13/2016 CLINICAL DATA:  Acute onset of fever, cough and body aches. Initial encounter. EXAM: CHEST  2 VIEW COMPARISON:  Chest radiograph performed 10/11/2016 FINDINGS: The lungs are well-aerated. Mild vascular congestion is noted. There is no evidence of focal opacification, pleural effusion or pneumothorax. The heart is normal in size; the mediastinal contour is within normal limits. No acute osseous abnormalities are seen. Clips are noted within the right upper quadrant, reflecting prior cholecystectomy. IMPRESSION: Mild vascular congestion noted.  Lungs remain grossly clear. Electronically Signed   By: Roanna RaiderJeffery  Chang M.D.   On: 10/13/2016 03:56    Procedures Procedures (including critical care time)  Medications Ordered in ED Medications  ibuprofen (ADVIL,MOTRIN) tablet 600 mg (600 mg Oral Given 10/13/16 0458)  acetaminophen (TYLENOL) tablet 1,000 mg (500  mg Oral Given 10/13/16 0457)     Initial Impression / Assessment and Plan / ED Course  I have reviewed the triage vital signs and the nursing notes.  Pertinent labs & imaging results that were available during my care of the patient were reviewed by me and considered in my medical decision making (see chart for details).  Clinical Course    Pt appears dehydrated but she has not had nausea, vomiting or diarrhea. She was allowed to orally hydrate in the ED. Rapid strep was ordered and chest xray.   Pt's temp improved. We discussed her test results. Zpak was added. She should continue her inhaler.   Final Clinical Impressions(s) / ED Diagnoses   Final diagnoses:  Bronchitis    New Prescriptions New Prescriptions   AZITHROMYCIN (ZITHROMAX) 250 MG TABLET    Take 2 po the first day then once a day for the next 4 days.    Plan discharge  Devoria AlbeIva Miria Cappelli, MD, Concha PyoFACEP    Ulysees Robarts, MD 10/13/16 [phone]

## 2016-10-13 NOTE — Discharge Instructions (Signed)
Drink plenty of fluids. Monitor your fever, take ibuprofen 600 mg + acetaminophen 650 mg every 6 hrs as needed for fever. Take the antibiotic until gone. You can take mucinex DM OTC for cough. The Tamiflu needs to be taken as soon after flu symptoms start as possible to be effective. Recheck if you struggle to breathe or seem worse.

## 2016-10-15 LAB — CULTURE, GROUP A STREP (THRC)

## 2016-10-19 DIAGNOSIS — G43719 Chronic migraine without aura, intractable, without status migrainosus: Secondary | ICD-10-CM | POA: Diagnosis not present

## 2016-10-19 DIAGNOSIS — J069 Acute upper respiratory infection, unspecified: Secondary | ICD-10-CM | POA: Diagnosis not present

## 2016-10-19 DIAGNOSIS — Z7952 Long term (current) use of systemic steroids: Secondary | ICD-10-CM | POA: Diagnosis not present

## 2016-10-22 ENCOUNTER — Emergency Department (HOSPITAL_COMMUNITY): Payer: BC Managed Care – PPO

## 2016-10-22 ENCOUNTER — Encounter (HOSPITAL_COMMUNITY): Payer: Self-pay

## 2016-10-22 ENCOUNTER — Emergency Department (HOSPITAL_COMMUNITY)
Admission: EM | Admit: 2016-10-22 | Discharge: 2016-10-23 | Disposition: A | Discharge status: Home / Self Care | Payer: BC Managed Care – PPO | Attending: Emergency Medicine | Admitting: Emergency Medicine

## 2016-10-22 DIAGNOSIS — S66391A Other injury of extensor muscle, fascia and tendon of left index finger at wrist and hand level, initial encounter: Secondary | ICD-10-CM | POA: Diagnosis not present

## 2016-10-22 DIAGNOSIS — Y9389 Activity, other specified: Secondary | ICD-10-CM | POA: Diagnosis not present

## 2016-10-22 DIAGNOSIS — W268XXA Contact with other sharp object(s), not elsewhere classified, initial encounter: Secondary | ICD-10-CM | POA: Diagnosis not present

## 2016-10-22 DIAGNOSIS — E119 Type 2 diabetes mellitus without complications: Secondary | ICD-10-CM | POA: Diagnosis not present

## 2016-10-22 DIAGNOSIS — E039 Hypothyroidism, unspecified: Secondary | ICD-10-CM | POA: Diagnosis not present

## 2016-10-22 DIAGNOSIS — Z7984 Long term (current) use of oral hypoglycemic drugs: Secondary | ICD-10-CM | POA: Diagnosis not present

## 2016-10-22 DIAGNOSIS — S62661B Nondisplaced fracture of distal phalanx of left index finger, initial encounter for open fracture: Secondary | ICD-10-CM | POA: Diagnosis not present

## 2016-10-22 DIAGNOSIS — Y999 Unspecified external cause status: Secondary | ICD-10-CM | POA: Insufficient documentation

## 2016-10-22 DIAGNOSIS — Z87891 Personal history of nicotine dependence: Secondary | ICD-10-CM | POA: Diagnosis not present

## 2016-10-22 DIAGNOSIS — S61402A Unspecified open wound of left hand, initial encounter: Secondary | ICD-10-CM | POA: Diagnosis not present

## 2016-10-22 DIAGNOSIS — S61209A Unspecified open wound of unspecified finger without damage to nail, initial encounter: Secondary | ICD-10-CM

## 2016-10-22 DIAGNOSIS — S6992XA Unspecified injury of left wrist, hand and finger(s), initial encounter: Secondary | ICD-10-CM | POA: Diagnosis not present

## 2016-10-22 DIAGNOSIS — S61211A Laceration without foreign body of left index finger without damage to nail, initial encounter: Secondary | ICD-10-CM | POA: Diagnosis not present

## 2016-10-22 DIAGNOSIS — Y929 Unspecified place or not applicable: Secondary | ICD-10-CM | POA: Insufficient documentation

## 2016-10-22 MED ORDER — LIDOCAINE HCL (PF) 1 % IJ SOLN
INTRAMUSCULAR | Status: AC
  Filled 2016-10-22: qty 5

## 2016-10-22 NOTE — ED Triage Notes (Signed)
Patient cut left index finger with a handheld kitchen aid electric utensil.  Cut to the bone per patient.  Patient has left index finger bandaged, bleeding controlled, elevated above her heart.

## 2016-10-23 DIAGNOSIS — S61311A Laceration without foreign body of left index finger with damage to nail, initial encounter: Secondary | ICD-10-CM | POA: Diagnosis not present

## 2016-10-23 DIAGNOSIS — S62661B Nondisplaced fracture of distal phalanx of left index finger, initial encounter for open fracture: Secondary | ICD-10-CM | POA: Diagnosis not present

## 2016-10-23 MED ORDER — ONDANSETRON 8 MG PO TBDP
8.0000 mg | ORAL_TABLET | Freq: Once | ORAL | Status: AC
  Administered 2016-10-23: 8 mg via ORAL
  Filled 2016-10-23: qty 1

## 2016-10-23 MED ORDER — CEPHALEXIN 500 MG PO CAPS: 500.0000 mg | ORAL_CAPSULE | Freq: Four times a day (QID) | ORAL | 0 refills | Status: DC

## 2016-10-23 MED ORDER — CEPHALEXIN 500 MG PO CAPS
500.0000 mg | ORAL_CAPSULE | Freq: Once | ORAL | Status: AC
  Administered 2016-10-23: 500 mg via ORAL
  Filled 2016-10-23: qty 1

## 2016-10-23 NOTE — ED Provider Notes (Signed)
[facility] Provider Note   CSN: [id number] Arrival date & time: 10/22/16  2220     History   Chief Complaint Chief Complaint  Patient presents with  . Finger Injury    HPI Holly [name] is a 56 y.o. female.  HPI   Holly [name] is a 56 y.o. female who presents to the Emergency Department complaining of laceration to the distal end of the left index finger that occurred shortly before ED arrival.  She states she was using a handheld electric blender/food processor. She reports several small laceration to the end of the finger with pain and difficulty bending the tip.  Wounds have been cleaned with tap water.  States her last Td was less than 5 yrs ago.  She denies numbness of the finger, swelling or blood thinners.    Past Medical History:  Diagnosis Date  . Anxiety    takes Ativan daily  . Back pain    stenosis and buldging disc  . Bone spur    neck and buldging disc  . Depression    takes Cymbalta daily  . Diabetes (HCC)    takes Metformin daily  . Diverticulosis   . GERD (gastroesophageal reflux disease)    takes Prevacid daily  . History of bronchitis    > 5 yrs ago  . History of hiatal hernia   . Hypothyroidism    takes Synthroid daily  . IBS (irritable bowel syndrome)   . Insomnia    doesn't take any meds  . Joint pain   . Lung nodule    right middle lobe-was being followed by Dr.Burney.Medical Md is keeping up with this  . Migraine   . Migraine    last one 08/28/15  . Nocturia   . Numbness and tingling in hands   . Sleep apnea   . Thoracic outlet syndrome   . Urinary urgency     Patient Active Problem List   Diagnosis Date Noted  . DJD (degenerative joint disease), cervical 09/18/2016  . Other fatigue 09/18/2016  . Fibromyalgia 09/18/2016  . Primary osteoarthritis of both knees 09/18/2016  . Primary insomnia 09/18/2016  . Vitamin D deficiency 09/18/2016  . Carpal tunnel syndrome, right 09/05/2015    Class: Chronic  . Trigger  finger, acquired 09/05/2015    Class: Chronic  . Pulmonary nodules 01/16/2013  . Dyspnea 01/13/2013    Past Surgical History:  Procedure Laterality Date  . ABDOMINAL EXPLORATION SURGERY     cancer cells in cervix  . CARPAL TUNNEL RELEASE Right 09/05/2015   Procedure: RIGHT OPEN CARPAL TUNNEL RELEASE, RIGHT LONG FINGER TRIGGER RELEASE,RIGHT INDEX FINGER TRIGGER RELEASE;  Surgeon: Kerrin ChampagneJames E Nitka, MD;  Location: [facility] OR;  Service: Orthopedics;  Laterality: Right;  . CHOLECYSTECTOMY    . COLONOSCOPY    . ESOPHAGOGASTRODUODENOSCOPY    . THYROIDECTOMY    . TONSILLECTOMY     adenoidectomy  . TRIGGER FINGER RELEASE Right 09/05/2015   Procedure: RELEASE TRIGGER FINGER/A-1 PULLEY;  Surgeon: Kerrin ChampagneJames E Nitka, MD;  Location: [facility] OR;  Service: Orthopedics;  Laterality: Right;    OB History    No data available       Home Medications    Prior to Admission medications   Medication Sig Start Date End Date Taking? Authorizing Provider  azithromycin (ZITHROMAX) 250 MG tablet Take 2 po the first day then once a day for the next 4 days. 10/13/16   Devoria AlbeIva Knapp, MD  DULoxetine (CYMBALTA) 20 MG capsule Take  20 mg by mouth every evening.     Historical Provider, MD  famotidine (PEPCID) 20 MG tablet Take 20 mg by mouth 2 (two) times daily.    Historical Provider, MD  lansoprazole (PREVACID) 30 MG capsule Take 30 mg by mouth 2 (two) times daily.    Historical Provider, MD  LORazepam (ATIVAN) 2 MG tablet Take 2 mg by mouth 4 (four) times daily.    Historical Provider, MD  metFORMIN (GLUCOPHAGE) 500 MG tablet Take 1,000 mg by mouth 2 (two) times daily with a meal.     Historical Provider, MD  SUBOXONE 8-2 MG FILM Place 3 Film under the tongue daily.  08/12/15   Historical Provider, MD  SYNTHROID 112 MCG tablet Take 112 mcg by mouth daily. 04/20/16   Historical Provider, MD    Family History Family History  Problem Relation Age of Onset  . COPD Mother   . Cancer Mother   . Heart disease Paternal Grandmother     . Heart disease Paternal Grandfather   . Heart disease Maternal Grandmother   . Heart disease Maternal Grandfather     Social History Social History  Substance Use Topics  . Smoking status: Former Smoker    Types: Cigarettes  . Smokeless tobacco: Never Used  . Alcohol use No     Allergies   Crestor [rosuvastatin calcium] and Triptans   Review of Systems Review of Systems  Constitutional: Negative for chills and fever.  Musculoskeletal: Positive for arthralgias. Negative for back pain and joint swelling.  Skin: Positive for wound.       Laceration left index finger  Neurological: Negative for dizziness, weakness and numbness.  Hematological: Does not bruise/bleed easily.  All other systems reviewed and are negative.    Physical Exam Updated Vital Signs BP 133/72 (BP Location: Right Arm)   Pulse 82   Temp 98.3 F (36.8 C) (Temporal)   Resp 18   Ht 5\' 5"  (1.651 m)   Wt 95.7 kg   SpO2 95%   BMI 35.11 kg/m   Physical Exam  Constitutional: She is oriented to person, place, and time. She appears well-developed and well-nourished. No distress.  HENT:  Head: Normocephalic and atraumatic.  Cardiovascular: Normal rate, regular rhythm and intact distal pulses.   No murmur heard. Pulmonary/Chest: Effort normal and breath sounds normal. No respiratory distress.  Musculoskeletal: She exhibits tenderness. She exhibits no edema.       Left hand: She exhibits tenderness and laceration. She exhibits no swelling. Normal sensation noted.       Hands: One puncture type wound to the fat pad of the left index finger and two small lacerations to the dorsal finger distal to the DIP.  Nail intact.  Pt unable to full flex at the DIP.  Sensation intact.    Neurological: She is alert and oriented to person, place, and time. She exhibits normal muscle tone. Coordination normal.  Skin: Skin is warm. Capillary refill takes less than 2 seconds. Laceration noted.  Nursing note and vitals  reviewed.    ED Treatments / Results  Labs (all labs ordered are listed, but only abnormal results are displayed) Labs Reviewed - No data to display  EKG  EKG Interpretation None       Radiology Dg Finger Index Left  Result Date: 10/22/2016 CLINICAL DATA:  Laceration to left index finger.  Initial encounter. EXAM: LEFT INDEX FINGER 2+V COMPARISON:  Left hand radiographs performed 04/20/2016 FINDINGS: There is a soft tissue laceration at  the distal second digit, with associated cortical disruption along the dorsal aspect of the distal phalanx. Visualized joint spaces are preserved. IMPRESSION: Soft tissue laceration of the distal second digit, with cortical disruption along the dorsal aspect of the distal second phalanx. Electronically Signed   By: Roanna Raider M.D.   On: 10/22/2016 23:34    Procedures Procedures (including critical care time)   LACERATION REPAIR Performed by: Martinique Pizzimenti L. Authorized by: Maxwell Caul Consent: Verbal consent obtained. Risks and benefits: risks, benefits and alternatives were discussed Consent given by: patient Patient identity confirmed: provided demographic data Prepped and Draped in normal sterile fashion Wound explored  Laceration Location: distal left index finger  Laceration Length: 1.5 cm  No Foreign Bodies seen or palpated  Anesthesia:digital block  Local anesthetic: lidocaine 1% w/o epinephrine  Anesthetic total: 2 ml  Irrigation method: syringe Amount of cleaning: standard  Skin closure: 4-0 prolene, loosely approximated  Number of sutures: 2  Technique: simple interrupted  Patient tolerance: Patient tolerated the procedure well with no immediate complications.   Medications Ordered in ED Medications  lidocaine (PF) (XYLOCAINE) 1 % injection (not administered)  cephALEXin (KEFLEX) capsule 500 mg (not administered)     Initial Impression / Assessment and Plan / ED Course  I have reviewed the triage  vital signs and the nursing notes.  Pertinent labs & imaging results that were available during my care of the patient were reviewed by me and considered in my medical decision making (see chart for details).     1610  Consulted Dr. Melvyn Novas.  Discussed findings.  He will see pt in his office this week for f/u  NV intact.  Open fx of the distal left index finger.  Bleeding controlled.  Also likely extensor tendon injury.    Finger bandaged and splinted.  Pt appears stable for d/c.  Td is UTD.  Rx for keflex.  Prefers to take tylenol or ibuprofen   Final Clinical Impressions(s) / ED Diagnoses   Final diagnoses:  Open nondisplaced fracture of distal phalanx of left index finger, initial encounter  Open wound of finger with tendon injury, initial encounter    New Prescriptions Discharge Medication List as of 10/23/2016  2:03 AM    START taking these medications   Details  cephALEXin (KEFLEX) 500 MG capsule Take 1 capsule (500 mg total) by mouth 4 (four) times daily. For 7 days, Starting Tue 10/23/2016, Print         Pauline Aus, [place] 10/25/16 1333    Glynn Octave, MD 10/28/16 2259

## 2016-10-23 NOTE — Discharge Instructions (Signed)
Keep the finger clean with mild soap and water and keep it bandaged and splinted.  Call Dr. Melvyn Novasrtmann office tomorrow to arrange a follow-up appt later this week.

## 2016-10-23 NOTE — ED Notes (Signed)
Dressing and splint applied to finger

## 2016-10-27 ENCOUNTER — Encounter (HOSPITAL_COMMUNITY): Payer: Self-pay | Admitting: Emergency Medicine

## 2016-10-27 ENCOUNTER — Emergency Department (HOSPITAL_COMMUNITY): Payer: BC Managed Care – PPO

## 2016-10-27 ENCOUNTER — Emergency Department (HOSPITAL_COMMUNITY)
Admission: EM | Admit: 2016-10-27 | Discharge: 2016-10-27 | Disposition: A | Discharge status: Home / Self Care | Payer: BC Managed Care – PPO | Attending: Emergency Medicine | Admitting: Emergency Medicine

## 2016-10-27 DIAGNOSIS — E039 Hypothyroidism, unspecified: Secondary | ICD-10-CM | POA: Diagnosis not present

## 2016-10-27 DIAGNOSIS — Z87891 Personal history of nicotine dependence: Secondary | ICD-10-CM | POA: Insufficient documentation

## 2016-10-27 DIAGNOSIS — R1011 Right upper quadrant pain: Secondary | ICD-10-CM | POA: Diagnosis not present

## 2016-10-27 DIAGNOSIS — Z7984 Long term (current) use of oral hypoglycemic drugs: Secondary | ICD-10-CM | POA: Diagnosis not present

## 2016-10-27 DIAGNOSIS — R509 Fever, unspecified: Secondary | ICD-10-CM | POA: Diagnosis present

## 2016-10-27 DIAGNOSIS — B349 Viral infection, unspecified: Secondary | ICD-10-CM

## 2016-10-27 DIAGNOSIS — E119 Type 2 diabetes mellitus without complications: Secondary | ICD-10-CM | POA: Diagnosis not present

## 2016-10-27 DIAGNOSIS — R1032 Left lower quadrant pain: Secondary | ICD-10-CM | POA: Diagnosis not present

## 2016-10-27 DIAGNOSIS — Z79899 Other long term (current) drug therapy: Secondary | ICD-10-CM | POA: Insufficient documentation

## 2016-10-27 DIAGNOSIS — R112 Nausea with vomiting, unspecified: Secondary | ICD-10-CM | POA: Diagnosis not present

## 2016-10-27 LAB — URINALYSIS, ROUTINE W REFLEX MICROSCOPIC
BACTERIA UA: NONE SEEN
BILIRUBIN URINE: NEGATIVE
Glucose, UA: NEGATIVE mg/dL
HGB URINE DIPSTICK: NEGATIVE
KETONES UR: NEGATIVE mg/dL
NITRITE: NEGATIVE
PROTEIN: NEGATIVE mg/dL
RBC / HPF: NONE SEEN RBC/hpf (ref 0–5)
SPECIFIC GRAVITY, URINE: 1.01 (ref 1.005–1.030)
pH: 6 (ref 5.0–8.0)

## 2016-10-27 LAB — COMPREHENSIVE METABOLIC PANEL
ALK PHOS: 81 U/L (ref 38–126)
ALT: 29 U/L (ref 14–54)
AST: 29 U/L (ref 15–41)
Albumin: 3.9 g/dL (ref 3.5–5.0)
Anion gap: 8 (ref 5–15)
BILIRUBIN TOTAL: 1.1 mg/dL (ref 0.3–1.2)
BUN: 13 mg/dL (ref 6–20)
CALCIUM: 8.7 mg/dL — AB (ref 8.9–10.3)
CHLORIDE: 93 mmol/L — AB (ref 101–111)
CO2: 31 mmol/L (ref 22–32)
CREATININE: 1.01 mg/dL — AB (ref 0.44–1.00)
Glucose, Bld: 157 mg/dL — ABNORMAL HIGH (ref 65–99)
Potassium: 3.6 mmol/L (ref 3.5–5.1)
Sodium: 132 mmol/L — ABNORMAL LOW (ref 135–145)
TOTAL PROTEIN: 7 g/dL (ref 6.5–8.1)

## 2016-10-27 LAB — CBC WITH DIFFERENTIAL/PLATELET
Basophils Absolute: 0 10*3/uL (ref 0.0–0.1)
Basophils Relative: 0 %
EOS ABS: 0.1 10*3/uL (ref 0.0–0.7)
EOS PCT: 1 %
HCT: 40.1 % (ref 36.0–46.0)
Hemoglobin: 14.6 g/dL (ref 12.0–15.0)
LYMPHS ABS: 2 10*3/uL (ref 0.7–4.0)
Lymphocytes Relative: 29 %
MCH: 30.4 pg (ref 26.0–34.0)
MCHC: 36.4 g/dL — ABNORMAL HIGH (ref 30.0–36.0)
MCV: 83.5 fL (ref 78.0–100.0)
Monocytes Absolute: 0.4 10*3/uL (ref 0.1–1.0)
Monocytes Relative: 6 %
Neutro Abs: 4.3 10*3/uL (ref 1.7–7.7)
Neutrophils Relative %: 64 %
Platelets: 155 10*3/uL (ref 150–400)
RBC: 4.8 MIL/uL (ref 3.87–5.11)
RDW: 12.8 % (ref 11.5–15.5)
WBC: 6.8 10*3/uL (ref 4.0–10.5)

## 2016-10-27 LAB — INFLUENZA PANEL BY PCR (TYPE A & B)
INFLAPCR: NEGATIVE
INFLBPCR: NEGATIVE

## 2016-10-27 MED ORDER — SODIUM CHLORIDE 0.9 % IV BOLUS (SEPSIS)
1000.0000 mL | Freq: Once | INTRAVENOUS | Status: AC
  Administered 2016-10-27: 1000 mL via INTRAVENOUS

## 2016-10-27 MED ORDER — ONDANSETRON HCL 4 MG/2ML IJ SOLN
4.0000 mg | Freq: Once | INTRAMUSCULAR | Status: AC
  Administered 2016-10-27: 4 mg via INTRAVENOUS
  Filled 2016-10-27: qty 2

## 2016-10-27 MED ORDER — ONDANSETRON HCL 4 MG PO TABS: 4.0000 mg | ORAL_TABLET | Freq: Three times a day (TID) | ORAL | 0 refills | Status: DC | PRN

## 2016-10-27 NOTE — ED Triage Notes (Signed)
Pt began having N/V yesterday afternoon all of a sudden.  Pt reports to many episodes of emesis to count since then.  She recovered from Bronchitis last week.

## 2016-10-27 NOTE — ED Provider Notes (Signed)
[facility]-EMERGENCY DEPT Provider Note   CSN: [id number] Arrival date & time: 10/27/16  1417     History   Chief Complaint Chief Complaint  Patient presents with  . Emesis  . Fever    HPI Holly [name] is a 56 y.o. female.  HPI   Pt p/w fever, N/V that began yesterday.  Pt has been sick recently diagnosed with bronchitis 10/13/16 and given z-pak, then had laceration of finger 10/23/16 and given keflex.  Yesterday developed N/V, fever to 102.7, pain all over her body.  Has taken tylenol.  Has seen hand surgeon about finger and is doing well.   Denies sore throat, cough, SOB, nasal congestion, diarrhea, urinary symptoms.  Had normal bowel movement today.    Past Medical History:  Diagnosis Date  . Anxiety    takes Ativan daily  . Back pain    stenosis and buldging disc  . Bone spur    neck and buldging disc  . Depression    takes Cymbalta daily  . Diabetes (HCC)    takes Metformin daily  . Diverticulosis   . GERD (gastroesophageal reflux disease)    takes Prevacid daily  . History of bronchitis    > 5 yrs ago  . History of hiatal hernia   . Hypothyroidism    takes Synthroid daily  . IBS (irritable bowel syndrome)   . Insomnia    doesn't take any meds  . Joint pain   . Lung nodule    right middle lobe-was being followed by Dr.Burney.Medical Md is keeping up with this  . Migraine   . Migraine    last one 08/28/15  . Nocturia   . Numbness and tingling in hands   . Sleep apnea   . Thoracic outlet syndrome   . Urinary urgency     Patient Active Problem List   Diagnosis Date Noted  . DJD (degenerative joint disease), cervical 09/18/2016  . Other fatigue 09/18/2016  . Fibromyalgia 09/18/2016  . Primary osteoarthritis of both knees 09/18/2016  . Primary insomnia 09/18/2016  . Vitamin D deficiency 09/18/2016  . Carpal tunnel syndrome, right 09/05/2015    Class: Chronic  . Trigger finger, acquired 09/05/2015    Class: Chronic  . Pulmonary nodules 01/16/2013  .  Dyspnea 01/13/2013    Past Surgical History:  Procedure Laterality Date  . ABDOMINAL EXPLORATION SURGERY     cancer cells in cervix  . CARPAL TUNNEL RELEASE Right 09/05/2015   Procedure: RIGHT OPEN CARPAL TUNNEL RELEASE, RIGHT LONG FINGER TRIGGER RELEASE,RIGHT INDEX FINGER TRIGGER RELEASE;  Surgeon: Kerrin Champagne, MD;  Location: [facility] OR;  Service: Orthopedics;  Laterality: Right;  . CHOLECYSTECTOMY    . COLONOSCOPY    . ESOPHAGOGASTRODUODENOSCOPY    . THYROIDECTOMY    . TONSILLECTOMY     adenoidectomy  . TRIGGER FINGER RELEASE Right 09/05/2015   Procedure: RELEASE TRIGGER FINGER/A-1 PULLEY;  Surgeon: Kerrin Champagne, MD;  Location: [facility] OR;  Service: Orthopedics;  Laterality: Right;    OB History    No data available       Home Medications    Prior to Admission medications   Medication Sig Start Date End Date Taking? Authorizing Provider  acetaminophen (TYLENOL) 500 MG tablet Take 500 mg by mouth every 6 (six) hours as needed.   Yes Historical Provider, MD  DULoxetine (CYMBALTA) 20 MG capsule Take 20 mg by mouth every evening.    Yes Historical Provider, MD  famotidine (PEPCID) 20 MG tablet  Take 20 mg by mouth 2 (two) times daily.   Yes Historical Provider, MD  lansoprazole (PREVACID) 30 MG capsule Take 30 mg by mouth 2 (two) times daily.   Yes Historical Provider, MD  LORazepam (ATIVAN) 2 MG tablet Take 2 mg by mouth 4 (four) times daily.   Yes Historical Provider, MD  metFORMIN (GLUCOPHAGE) 500 MG tablet Take 1,000 mg by mouth 2 (two) times daily with a meal.    Yes Historical Provider, MD  SUBOXONE 8-2 MG FILM Place 3 Film under the tongue daily.  08/12/15  Yes Historical Provider, MD  SYNTHROID 112 MCG tablet Take 112 mcg by mouth daily. 04/20/16  Yes Historical Provider, MD  azithromycin (ZITHROMAX) 250 MG tablet Take 2 po the first day then once a day for the next 4 days. Patient not taking: Reported on 10/27/2016 10/13/16   Devoria AlbeIva Knapp, MD  cephALEXin (KEFLEX) 500 MG capsule Take 1  capsule (500 mg total) by mouth 4 (four) times daily. For 7 days 10/23/16   Tammy Triplett, PA-C  ondansetron (ZOFRAN) 4 MG tablet Take 1 tablet (4 mg total) by mouth every 8 (eight) hours as needed for nausea or vomiting. 10/27/16   Trixie DredgeEmily Gussie Murton, PA-C  pravastatin (PRAVACHOL) 20 MG tablet Take 20 mg by mouth daily. 10/08/16   Historical Provider, MD    Family History Family History  Problem Relation Age of Onset  . COPD Mother   . Cancer Mother   . Heart disease Paternal Grandmother   . Heart disease Paternal Grandfather   . Heart disease Maternal Grandmother   . Heart disease Maternal Grandfather     Social History Social History  Substance Use Topics  . Smoking status: Former Smoker    Types: Cigarettes  . Smokeless tobacco: Never Used  . Alcohol use No     Allergies   Crestor [rosuvastatin calcium] and Triptans   Review of Systems Review of Systems  All other systems reviewed and are negative.    Physical Exam Updated Vital Signs BP 129/83 (BP Location: Left Arm)   Pulse 91   Temp 99 F (37.2 C) (Oral)   Resp 18   Ht 5\' 5"  (1.651 m)   Wt 95.3 kg   SpO2 94%   BMI 34.95 kg/m   Physical Exam  Constitutional: She appears well-developed and well-nourished. No distress.  HENT:  Head: Normocephalic and atraumatic.  Neck: Neck supple.  Cardiovascular: Normal rate and regular rhythm.   Pulmonary/Chest: Effort normal and breath sounds normal. No respiratory distress. She has no wheezes. She has no rales.  Abdominal: Soft. She exhibits no distension. There is tenderness in the right upper quadrant and left lower quadrant. There is no rebound and no guarding.  Neurological: She is alert.  Skin: She is not diaphoretic.  Nursing note and vitals reviewed.    ED Treatments / Results  Labs (all labs ordered are listed, but only abnormal results are displayed) Labs Reviewed  COMPREHENSIVE METABOLIC PANEL - Abnormal; Notable for the following:       Result Value    Sodium 132 (*)    Chloride 93 (*)    Glucose, Bld 157 (*)    Creatinine, Ser 1.01 (*)    Calcium 8.7 (*)    All other components within normal limits  CBC WITH DIFFERENTIAL/PLATELET - Abnormal; Notable for the following:    MCHC 36.4 (*)    All other components within normal limits  URINALYSIS, ROUTINE W REFLEX MICROSCOPIC - Abnormal; Notable for  the following:    Leukocytes, UA TRACE (*)    Squamous Epithelial / LPF 0-5 (*)    All other components within normal limits  INFLUENZA PANEL BY PCR (TYPE A & B)    EKG  EKG Interpretation None       Radiology Dg Chest 2 View  Result Date: 10/27/2016 CLINICAL DATA:  Patient with sudden onset nausea and vomiting. EXAM: CHEST  2 VIEW COMPARISON:  Chest radiograph 10/13/2016. FINDINGS: Stable enlarged cardiac and mediastinal contours. No consolidative pulmonary opacities. No pleural effusion or pneumothorax. Mid thoracic spine degenerative changes. Cholecystectomy clips. IMPRESSION: No active cardiopulmonary disease. Electronically Signed   By: Annia Belt M.D.   On: 10/27/2016 16:26    Procedures Procedures (including critical care time)  Medications Ordered in ED Medications  sodium chloride 0.9 % bolus 1,000 mL (0 mLs Intravenous Stopped 10/27/16 1815)  ondansetron (ZOFRAN) injection 4 mg (4 mg Intravenous Given 10/27/16 1702)  sodium chloride 0.9 % bolus 1,000 mL (0 mLs Intravenous Stopped 10/27/16 2043)     Initial Impression / Assessment and Plan / ED Course  I have reviewed the triage vital signs and the nursing notes.  Pertinent labs & imaging results that were available during my care of the patient were reviewed by me and considered in my medical decision making (see chart for details).  Clinical Course as of Oct 27 2056  Sat Oct 27, 2016  1909 Repeat abdominal exam is completely benign.  No tenderness.  No guarding or rebound.    [EW]    Clinical Course User Index [EW] Trixie Dredge, PA-C    Febrile patient with N/V,  myalgias that began yesterday.  Labs demonstrate mild dehydration. CXR negative. UA does not appear infected. Influenza negative.  IVF and nausea medication given.  Pt also seen and examined by Dr Estell Harpin.  Suspect early viral illness. D/C home with zofran, PCP follow up, return precautions.  Discussed result, findings, treatment, and follow up  with patient.  Pt given return precautions.  Pt verbalizes understanding and agrees with plan.      Final Clinical Impressions(s) / ED Diagnoses   Final diagnoses:  Viral illness    New Prescriptions Discharge Medication List as of 10/27/2016  7:39 PM    START taking these medications   Details  ondansetron (ZOFRAN) 4 MG tablet Take 1 tablet (4 mg total) by mouth every 8 (eight) hours as needed for nausea or vomiting., Starting Sat 10/27/2016, Print         [place], [place] 10/27/16 2101    Bethann Berkshire, MD 10/27/16 2258

## 2016-10-27 NOTE — Discharge Instructions (Signed)
Read the information below.  Use the prescribed medication as directed.  Please discuss all new medications with your pharmacist.  You may return to the Emergency Department at any time for worsening condition or any new symptoms that concern you.    °

## 2016-10-30 DIAGNOSIS — S63275D Dislocation of unspecified interphalangeal joint of left ring finger, subsequent encounter: Secondary | ICD-10-CM | POA: Diagnosis not present

## 2016-10-31 DIAGNOSIS — J029 Acute pharyngitis, unspecified: Secondary | ICD-10-CM | POA: Diagnosis not present

## 2016-10-31 DIAGNOSIS — R509 Fever, unspecified: Secondary | ICD-10-CM | POA: Diagnosis not present

## 2016-10-31 DIAGNOSIS — Z87898 Personal history of other specified conditions: Secondary | ICD-10-CM | POA: Diagnosis not present

## 2016-11-01 DIAGNOSIS — G4733 Obstructive sleep apnea (adult) (pediatric): Secondary | ICD-10-CM | POA: Diagnosis not present

## 2016-11-07 DIAGNOSIS — G4733 Obstructive sleep apnea (adult) (pediatric): Secondary | ICD-10-CM | POA: Diagnosis not present

## 2016-11-15 ENCOUNTER — Ambulatory Visit (INDEPENDENT_AMBULATORY_CARE_PROVIDER_SITE_OTHER): Payer: Medicare Other | Admitting: Physician Assistant

## 2016-11-15 ENCOUNTER — Telehealth (INDEPENDENT_AMBULATORY_CARE_PROVIDER_SITE_OTHER): Payer: Self-pay | Admitting: Radiology

## 2016-11-15 NOTE — Telephone Encounter (Signed)
Patient cancelled appointment with Bronson CurbGil today, for possible finger infection due to migraine. She did not wish to reschedule at this time. She will contact our office if she would like an appointment.

## 2016-11-16 DIAGNOSIS — S61311D Laceration without foreign body of left index finger with damage to nail, subsequent encounter: Secondary | ICD-10-CM | POA: Diagnosis not present

## 2016-11-23 DIAGNOSIS — S61311D Laceration without foreign body of left index finger with damage to nail, subsequent encounter: Secondary | ICD-10-CM | POA: Diagnosis not present

## 2016-11-23 DIAGNOSIS — M47816 Spondylosis without myelopathy or radiculopathy, lumbar region: Secondary | ICD-10-CM | POA: Diagnosis not present

## 2016-12-17 ENCOUNTER — Ambulatory Visit (INDEPENDENT_AMBULATORY_CARE_PROVIDER_SITE_OTHER): Payer: Medicare Other | Admitting: Orthopaedic Surgery

## 2017-01-07 DIAGNOSIS — H01111 Allergic dermatitis of right upper eyelid: Secondary | ICD-10-CM | POA: Diagnosis not present

## 2017-01-09 ENCOUNTER — Ambulatory Visit (INDEPENDENT_AMBULATORY_CARE_PROVIDER_SITE_OTHER): Payer: BC Managed Care – PPO | Admitting: Orthopaedic Surgery

## 2017-01-09 ENCOUNTER — Ambulatory Visit (INDEPENDENT_AMBULATORY_CARE_PROVIDER_SITE_OTHER): Payer: Medicare HMO

## 2017-01-09 DIAGNOSIS — M25512 Pain in left shoulder: Secondary | ICD-10-CM | POA: Diagnosis not present

## 2017-01-09 MED ORDER — METHYLPREDNISOLONE ACETATE 40 MG/ML IJ SUSP
40.0000 mg | INTRAMUSCULAR | Status: AC | PRN
  Administered 2017-01-09: 40 mg via INTRA_ARTICULAR

## 2017-01-09 NOTE — Progress Notes (Signed)
Office Visit Note   Patient: Holly [name]           Date of Birth: [birth year]           MRN: [id number] Visit Date: 01/09/2017              Requested by: Merri Brunette, MD [phone] [address] [place], [place] [address] PCP: Allean Found, MD   Assessment & Plan: Visit Diagnoses:  1. Acute pain of left shoulder     Plan: He understands that we want to try steroid injection in her left shoulder subacromial space. The risks and benefits of injections were explained to her and she tolerated it well. I'll see her back in 2 weeks to see how she is done overall with her shoulder. She'll work on shoulder exercises and intermittent anti-inflammatories as needed.  Follow-Up Instructions: Return in about 2 weeks (around 01/23/2017).   Orders:  Orders Placed This Encounter  Procedures  . Large Joint Injection/Arthrocentesis  . XR Shoulder Left   No orders of the defined types were placed in this encounter.     Procedures: Large Joint Inj Date/Time: 01/09/2017 3:50 PM Performed by: Kathryne Hitch Authorized by: Kathryne Hitch   Location:  Shoulder Site:  L glenohumeral Ultrasound Guidance: No   Fluoroscopic Guidance: No   Arthrogram: No   Medications:  40 mg methylPREDNISolone acetate 40 MG/ML     Clinical Data: No additional findings.   Subjective: No chief complaint on file. The patient comes in with chief complaint of left shoulder pain. Is been getting worse over last month when she fell hurts with overhead activities and neck is daily living. It hurts when she reaches behind her. The shoulder does feel weak. His her left shoulder is causing this pain and discomfort. It's waking her up at night as well.  HPI  Review of Systems A chest pain, short of breath, fever, chills, nausea, vomiting.  Objective: Vital Signs: There were no vitals taken for this visit.  Physical Exam She is alert and oriented 3 and in no acute  distress Ortho Exam Examination of her left shoulder shows weakness of liftoff. She has positive Neer and Hawkins signs. She has pain with internal rotation with adduction and that is also limited to just of the lower aspect the lumbar spine. She has pain with abduction as well as external rotation. She is neurovascular intact. Specialty Comments:  No specialty comments available.  Imaging: Xr Shoulder Left  Result Date: 01/09/2017 Views of the left shoulder show well located shoulder. The acromioclavicular joint is intact. There is adequate space in the subacromial outlet. His no significant arthritic findings. There is no evidence of acute    PMFS History: Patient Active Problem List   Diagnosis Date Noted  . DJD (degenerative joint disease), cervical 09/18/2016  . Other fatigue 09/18/2016  . Fibromyalgia 09/18/2016  . Primary osteoarthritis of both knees 09/18/2016  . Primary insomnia 09/18/2016  . Vitamin D deficiency 09/18/2016  . Carpal tunnel syndrome, right 09/05/2015    Class: Chronic  . Trigger finger, acquired 09/05/2015    Class: Chronic  . Pulmonary nodules 01/16/2013  . Dyspnea 01/13/2013   Past Medical History:  Diagnosis Date  . Anxiety    takes Ativan daily  . Back pain    stenosis and buldging disc  . Bone spur    neck and buldging disc  . Depression    takes Cymbalta daily  . Diabetes (HCC)  takes Metformin daily  . Diverticulosis   . GERD (gastroesophageal reflux disease)    takes Prevacid daily  . History of bronchitis    > 5 yrs ago  . History of hiatal hernia   . Hypothyroidism    takes Synthroid daily  . IBS (irritable bowel syndrome)   . Insomnia    doesn't take any meds  . Joint pain   . Lung nodule    right middle lobe-was being followed by Dr.Burney.Medical Md is keeping up with this  . Migraine   . Migraine    last one 08/28/15  . Nocturia   . Numbness and tingling in hands   . Sleep apnea   . Thoracic outlet syndrome   .  Urinary urgency     Family History  Problem Relation Age of Onset  . COPD Mother   . Cancer Mother   . Heart disease Paternal Grandmother   . Heart disease Paternal Grandfather   . Heart disease Maternal Grandmother   . Heart disease Maternal Grandfather     Past Surgical History:  Procedure Laterality Date  . ABDOMINAL EXPLORATION SURGERY     cancer cells in cervix  . CARPAL TUNNEL RELEASE Right 09/05/2015   Procedure: RIGHT OPEN CARPAL TUNNEL RELEASE, RIGHT LONG FINGER TRIGGER RELEASE,RIGHT INDEX FINGER TRIGGER RELEASE;  Surgeon: Kerrin Champagne, MD;  Location: [facility] OR;  Service: Orthopedics;  Laterality: Right;  . CHOLECYSTECTOMY    . COLONOSCOPY    . ESOPHAGOGASTRODUODENOSCOPY    . THYROIDECTOMY    . TONSILLECTOMY     adenoidectomy  . TRIGGER FINGER RELEASE Right 09/05/2015   Procedure: RELEASE TRIGGER FINGER/A-1 PULLEY;  Surgeon: Kerrin Champagne, MD;  Location: [facility] OR;  Service: Orthopedics;  Laterality: Right;   Social History   Occupational History  . Not on file.   Social History Main Topics  . Smoking status: Former Smoker    Types: Cigarettes  . Smokeless tobacco: Never Used  . Alcohol use No  . Drug use: No  . Sexual activity: Yes    Birth control/ protection: Post-menopausal

## 2017-01-11 DIAGNOSIS — G43719 Chronic migraine without aura, intractable, without status migrainosus: Secondary | ICD-10-CM | POA: Diagnosis not present

## 2017-01-17 ENCOUNTER — Encounter: Payer: Self-pay | Admitting: Family Medicine

## 2017-01-17 ENCOUNTER — Ambulatory Visit (INDEPENDENT_AMBULATORY_CARE_PROVIDER_SITE_OTHER): Payer: BC Managed Care – PPO | Admitting: Family Medicine

## 2017-01-17 DIAGNOSIS — E119 Type 2 diabetes mellitus without complications: Secondary | ICD-10-CM | POA: Diagnosis not present

## 2017-01-17 NOTE — Progress Notes (Signed)
Medical Nutrition Therapy:  Appt start time: 0923 end time:  1630. PCP: Carol Ada, MD [facility] Med: Nehemiah Settle, MD  Husband: Jenny Reichmann  Assessment:  Primary concerns today: Weight management and Blood sugar control, E11.9.  Tanice was referred by Dr. Tamala Julian for dietary  Management of BG.  She said she craves sweets, and usually wants something sweet at the end of each meal.  Marabelle gets migraines a couple times a week, sometimes lasting 2 to 3 days, which has an impact on both her sleep and eating behavior.  Harley has always had migraines 1-2 X yr, but started getting frequent migraines following thyroidectomy in 2006.    Shontavia checks FBG and again at bedtime (or 2 hr post-prandial).  FBG have been running 111-147.  A1C in June 2017 was 6.4.    It is clear from her history that Anahlia will not be able to adhere to the usual recommendations for BG and weight  management b/c of her chronic pain from both fibromyalgia and migraine H/A.  We discussed these recommendations, however, as I want her to understand the ideal pattern of eating.  Also discussed where she might be able to at least start.  Also emphasized the importance of getting a more consistent sleep pattern as she is able.    Learning Readiness: Ready  Usual eating pattern includes 1 meal and 1 snack per day. Frequent foods and beverages include  blk coffee, diet ginger ale, canned pears w/ Ileene Hutchinson, apple salad w/ raisins, nuts, mayo, fried fish or chx, mac&chs, ff's, coleslaw, sweet potato, chx tenders.  On days her H/S is not too bad, Grisel goes out to dinner with her dad and husband - ([employer], [employer], [employer], Mrs. Elenore Rota) usually about 4 X wk.  Avoided foods include garlic, onion (migraines), tomato-based foods, spicy foods (GERD).   Usual physical activity includes none currently.  24-hr recall: (Up at 7:30 PM; recovering from migraine previous couple of days) B ( AM)-    Snk ( AM)-    L (  PM)-   Snk (7:30)-  30 oz diet ginger ale D (8:30)-  6 [employer] chx nuggets, ~ 1 c ff's, 30 oz diet ginger ale Snk (10:30)-  1 handful gummie bears Typical day? Yes.  for a day following migraine.  For example, today she had lunch of 3 bacon on 1 slc bread, not having slept all last night.    Progress Towards Goal(s):  In progress.   Nutritional Diagnosis:  NB-1.5 Disordered eating pattern As related to migraine headaches.  As evidenced by erratic sleep and eating patterns.    Intervention:  Nutrition education  Handouts given during visit include:  AVS  Demonstrated degree of understanding via:  Teach Back  Barriers to learning/adherence to lifestyle change: Pain.   Monitoring/Evaluation:  Dietary intake, exercise, FBG, and body weight in 4 week(s).

## 2017-01-17 NOTE — Patient Instructions (Addendum)
  Diet Recommendations for Diabetes  Starchy (carb) foods: Bread, rice, pasta, potatoes, corn, cereal, grits, crackers, bagels, muffins, all baked goods.  (Fruits, milk, and yogurt also have carbohydrate, but most of these foods will not spike your blood sugar as most starchy foods will.)  A few fruits do cause high blood sugars; use small portions of bananas (limit to 1/2 at a time), grapes, watermelon, oranges, and most tropical fruits.   Protein foods: Meat, fish, poultry, eggs, dairy foods, and beans such as pinto and kidney beans (beans also provide carbohydrate).   1. Eat at least 3 meals and 1-2 snacks per day. Never go more than 4-5 hours while awake without eating. Eat breakfast within the first hour of getting up.  Use a luncheon plate to keep portion sizes small.    - If you are sick, a nutritional drink like Glucerna is reasonable.  (Look for the generic equivalent.) 2. Limit starchy foods to TWO per meal and ONE per snack. ONE portion of a starchy  food is equal to the following:   - ONE slice of bread (or its equivalent, such as half of a hamburger bun).   - 1/2 cup of a "scoopable" starchy food such as potatoes or rice.   - 15 grams of Total Carbohydrate as shown on food label.  3. Include at every meal: a protein food, a carb food, and vegetables and/or fruit.   - Obtain twice the volume of veg's as protein or carbohydrate foods for both lunch and dinner.   - Fresh or frozen veg's are best.   - Keep frozen veg's on hand for a quick vegetable serving.     - Protein helps to stabilize your blood glucose levels.    - You will not be starting with the above recommendations, but this gives you some idea of what is ideal for managing blood glucose, and it is something to work toward.    - RECOMMENDATIONS FOR NOW:    - When you are awake, do your best to eat SOMEthing every 5 hours or so.    - Aim to get veg's at least 2 times a day (lunch and dinner).    - Anti-inflammatory diet  includes a lot of veg's, some fruit daily, and non-fried fish, NO fried foods, and VERY limited sugar.    - Diet ginger ale:  Please consider that the large quantities you are now usually getting may not be the best thing for you.     - SLEEP: Getting better sleep will be crucial to your blood glucose control and overall health.  Even when recovering from a migraine, do what you can to maintain a consistent sleep schedule.

## 2017-01-22 ENCOUNTER — Ambulatory Visit (INDEPENDENT_AMBULATORY_CARE_PROVIDER_SITE_OTHER): Payer: BC Managed Care – PPO | Admitting: Ophthalmology

## 2017-01-22 DIAGNOSIS — E113293 Type 2 diabetes mellitus with mild nonproliferative diabetic retinopathy without macular edema, bilateral: Secondary | ICD-10-CM

## 2017-01-22 DIAGNOSIS — H43813 Vitreous degeneration, bilateral: Secondary | ICD-10-CM | POA: Diagnosis not present

## 2017-01-22 DIAGNOSIS — E11319 Type 2 diabetes mellitus with unspecified diabetic retinopathy without macular edema: Secondary | ICD-10-CM | POA: Diagnosis not present

## 2017-01-23 ENCOUNTER — Ambulatory Visit (INDEPENDENT_AMBULATORY_CARE_PROVIDER_SITE_OTHER): Payer: Medicare HMO | Admitting: Orthopaedic Surgery

## 2017-02-14 ENCOUNTER — Ambulatory Visit: Payer: BC Managed Care – PPO | Admitting: Family Medicine

## 2017-03-13 IMAGING — CT CT HEAD W/O CM
5 of 8 series · 17 of 47 positions shown, 19 images · non-contrast
Comparison: Cervical spine CT dated 01/11/2014

CLINICAL DATA: 54-year-old female with fall and severe headache and
neck pain

EXAM:
CT HEAD WITHOUT CONTRAST
CT CERVICAL SPINE WITHOUT CONTRAST
TECHNIQUE: Multidetector CT imaging of the head and cervical spine was
performed following the standard protocol without intravenous
contrast. Multiplanar CT image reconstructions of the cervical spine
were also generated.

[Series 2: head w/o · axial · non-contrast · 0.43mm/px · z∈[+128,+180]mm · 2 of 40 slices shown]
[im 14/40  brain]
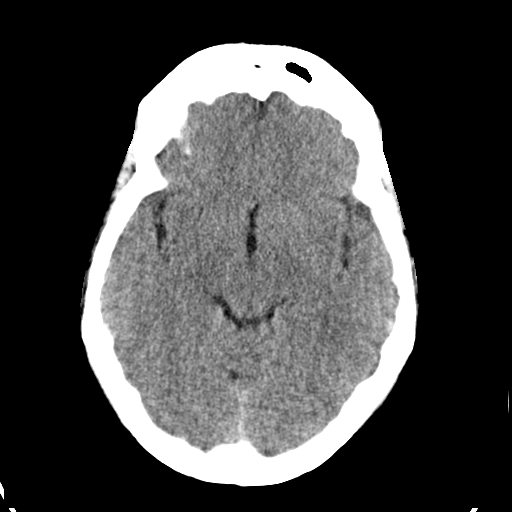
[im 27/40  brain]
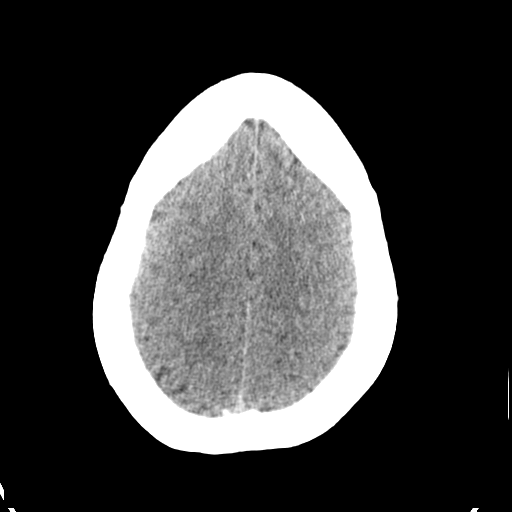

[Series 3: head bone · axial · 0.43mm/px · z∈[+98,+166]mm · 4 of 80 slices shown]
[im 12/80  bone]
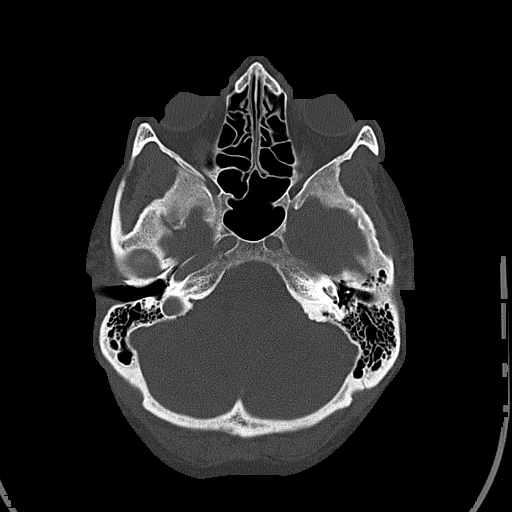
[im 23/80  bone]
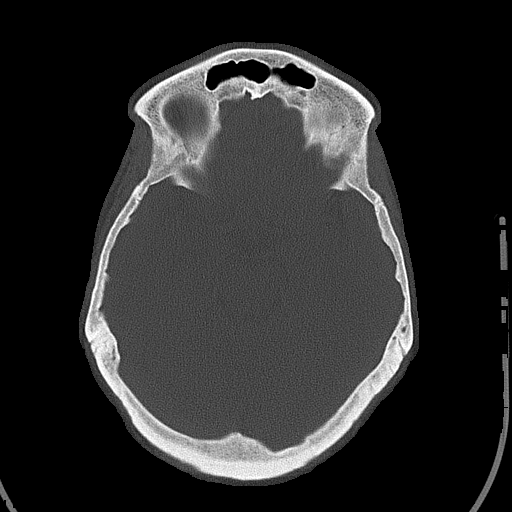
[im 34/80  bone]
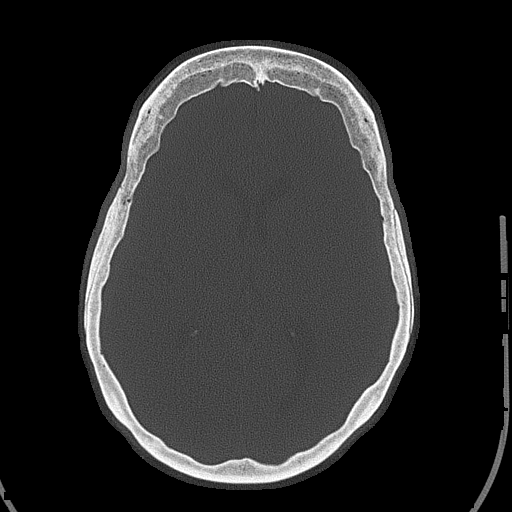
[im 46/80  bone]
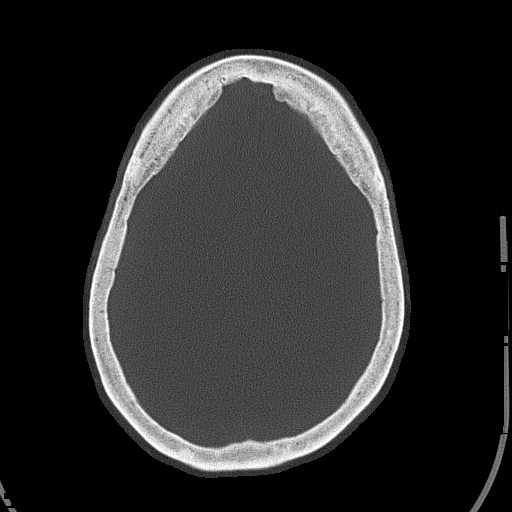

[Series 4: coronal · coronal · 0.33mm/px · 3 of 69 slices shown]
[im 18/69  brain]
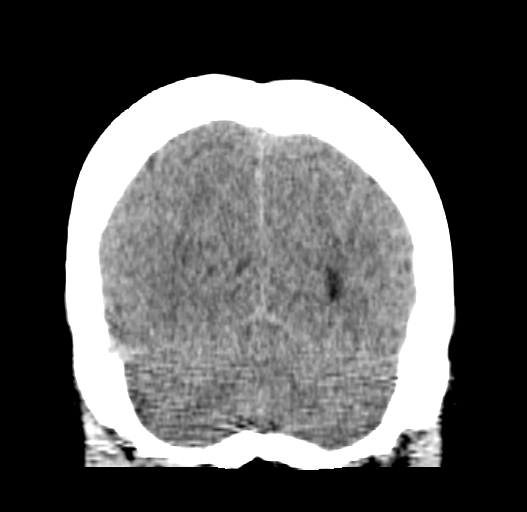
[im 35/69  brain]
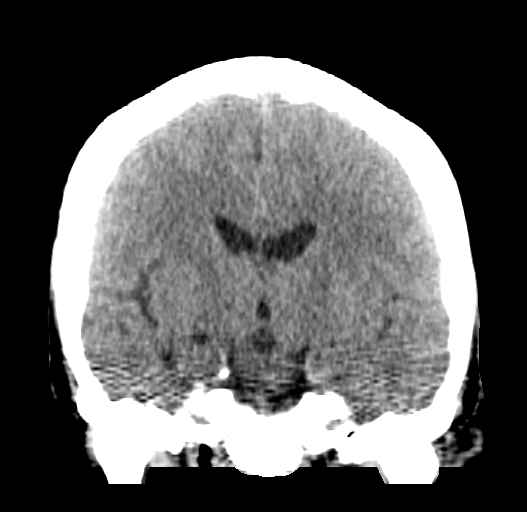
[im 52/69  brain]
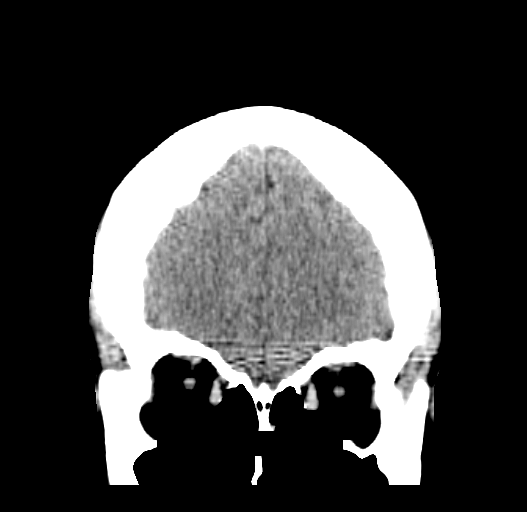

[Series 5: sagittal · sagittal · 0.32mm/px · 1 of 55 slices shown]
[im 28/55  brain]
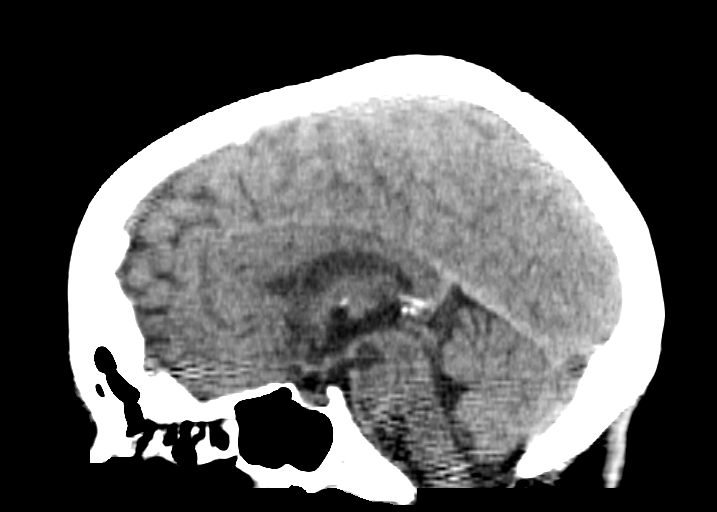

[Series 10: orthogonal axial · axial · 0.23mm/px · z∈[-71,+75]mm · 7 of 101 slices shown, 9 images]
[im 13/101  brain]
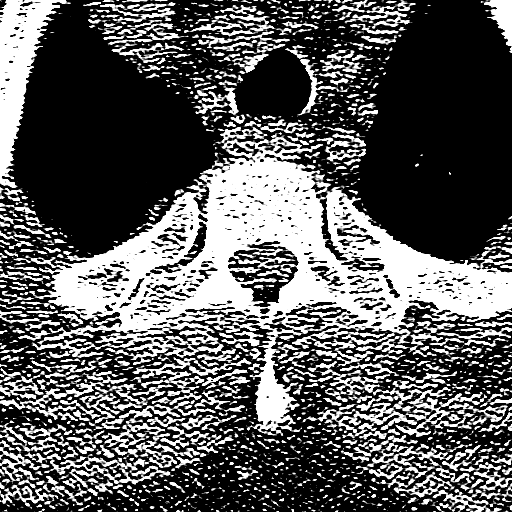
[im 13/101  bone]
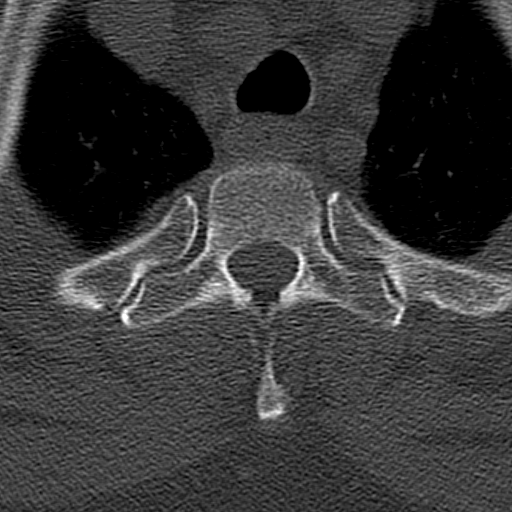
[im 26/101  brain]
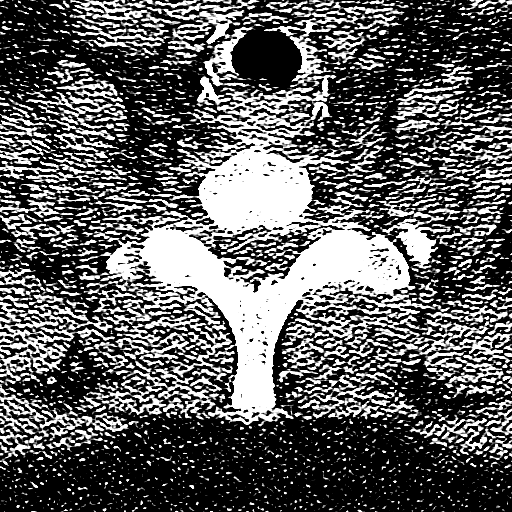
[im 38/101  brain]
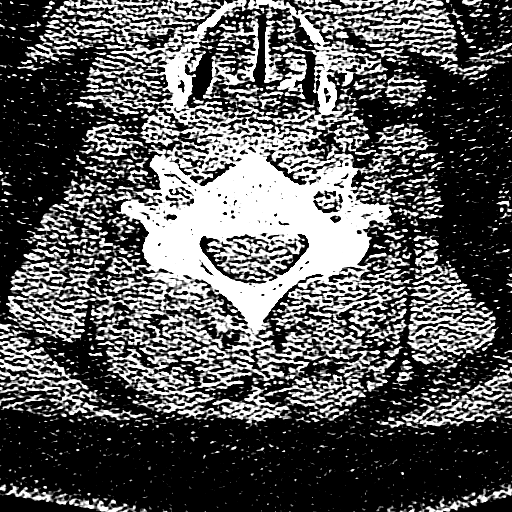
[im 51/101  brain]
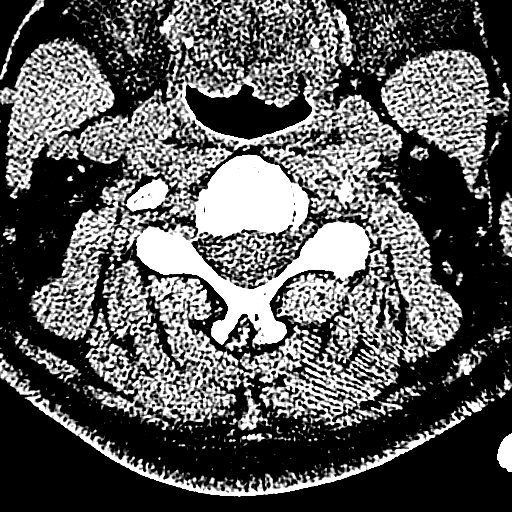
[im 63/101  brain]
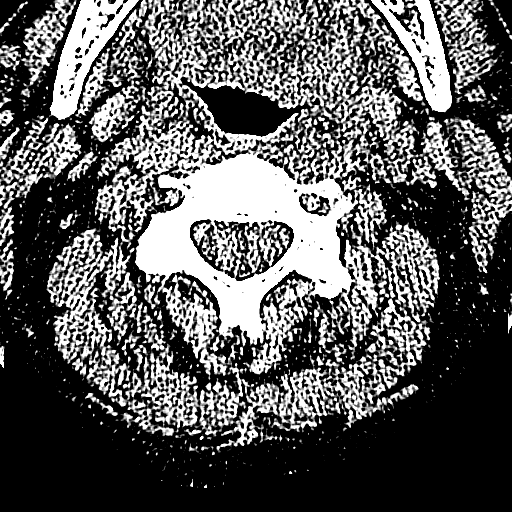
[im 63/101  bone]
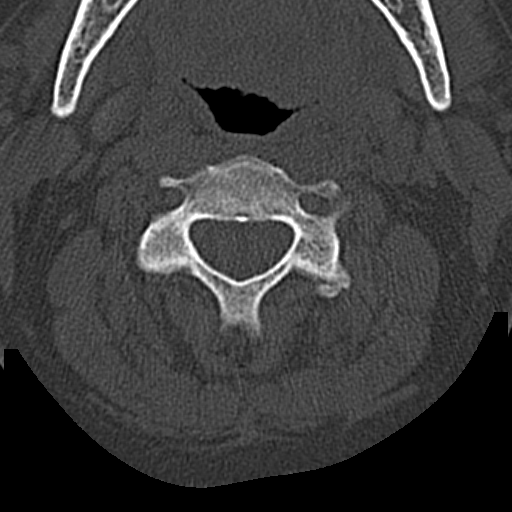
[im 76/101  brain]
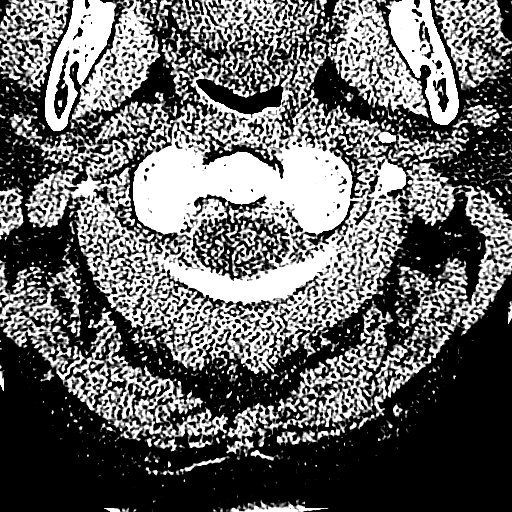
[im 88/101  brain]
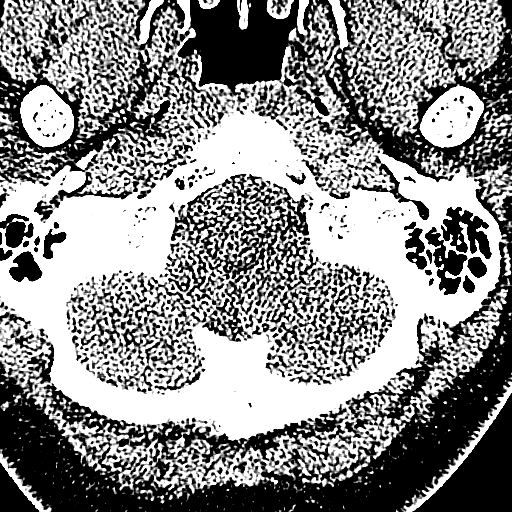

[17 of 47 positions shown; findings below may reference images not displayed]

FINDINGS: CT HEAD FINDINGS

The ventricles and the sulci are appropriate in size for the
patient's age. There is no intracranial hemorrhage. No midline shift
or mass effect identified. The gray-white matter differentiation is
preserved. The visualized paranasal sinuses and mastoid air cells
are well aerated. The calvarium is intact.

CT CERVICAL SPINE FINDINGS

There is no acute fracture or subluxation of the cervical
spine.There is mild degenerative changes and straightening of the
normal cervical lordosis.The odontoid and spinous processes are
intact.There is normal anatomic alignment of the C1-C2 lateral
masses. The visualized soft tissues appear unremarkable.
IMPRESSION: No acute intracranial pathology.

No acute/ traumatic cervical spine pathology.

## 2017-04-05 DIAGNOSIS — G43719 Chronic migraine without aura, intractable, without status migrainosus: Secondary | ICD-10-CM | POA: Diagnosis not present

## 2017-04-05 DIAGNOSIS — R69 Illness, unspecified: Secondary | ICD-10-CM | POA: Diagnosis not present

## 2017-05-10 DIAGNOSIS — R69 Illness, unspecified: Secondary | ICD-10-CM | POA: Diagnosis not present

## 2017-05-10 DIAGNOSIS — G43719 Chronic migraine without aura, intractable, without status migrainosus: Secondary | ICD-10-CM | POA: Diagnosis not present

## 2017-07-05 DIAGNOSIS — G43719 Chronic migraine without aura, intractable, without status migrainosus: Secondary | ICD-10-CM | POA: Diagnosis not present

## 2017-07-05 DIAGNOSIS — R69 Illness, unspecified: Secondary | ICD-10-CM | POA: Diagnosis not present

## 2017-07-23 ENCOUNTER — Other Ambulatory Visit: Payer: Self-pay | Admitting: Gastroenterology

## 2017-07-23 DIAGNOSIS — R7989 Other specified abnormal findings of blood chemistry: Secondary | ICD-10-CM

## 2017-07-23 DIAGNOSIS — R945 Abnormal results of liver function studies: Principal | ICD-10-CM

## 2017-07-23 NOTE — Progress Notes (Signed)
Jammie Clink MD 

## 2017-07-24 ENCOUNTER — Ambulatory Visit
Admission: RE | Admit: 2017-07-24 | Discharge: 2017-07-24 | Disposition: A | Discharge status: Home / Self Care | Payer: BC Managed Care – PPO | Source: Ambulatory Visit | Attending: Gastroenterology | Admitting: Gastroenterology

## 2017-07-24 DIAGNOSIS — R7989 Other specified abnormal findings of blood chemistry: Secondary | ICD-10-CM

## 2017-07-24 DIAGNOSIS — R945 Abnormal results of liver function studies: Principal | ICD-10-CM

## 2017-07-25 ENCOUNTER — Other Ambulatory Visit: Payer: Self-pay | Admitting: Gastroenterology

## 2017-07-25 DIAGNOSIS — K760 Fatty (change of) liver, not elsewhere classified: Secondary | ICD-10-CM

## 2017-07-25 NOTE — Progress Notes (Signed)
Holly Chesnut MD 

## 2017-07-25 NOTE — Progress Notes (Signed)
Holly Knick MD 

## 2017-07-29 ENCOUNTER — Ambulatory Visit
Admission: RE | Admit: 2017-07-29 | Discharge: 2017-07-29 | Disposition: A | Discharge status: Home / Self Care | Payer: BC Managed Care – PPO | Source: Ambulatory Visit | Attending: Gastroenterology | Admitting: Gastroenterology

## 2017-07-29 DIAGNOSIS — K760 Fatty (change of) liver, not elsewhere classified: Secondary | ICD-10-CM

## 2017-07-29 MED ORDER — IOPAMIDOL (ISOVUE-300) INJECTION 61%
125.0000 mL | Freq: Once | INTRAVENOUS | Status: AC | PRN
  Administered 2017-07-29: 125 mL via INTRAVENOUS

## 2017-08-16 ENCOUNTER — Other Ambulatory Visit: Payer: Self-pay | Admitting: Gastroenterology

## 2017-08-16 DIAGNOSIS — R935 Abnormal findings on diagnostic imaging of other abdominal regions, including retroperitoneum: Secondary | ICD-10-CM

## 2017-08-30 ENCOUNTER — Encounter (INDEPENDENT_AMBULATORY_CARE_PROVIDER_SITE_OTHER): Payer: Self-pay | Admitting: Family

## 2017-08-30 ENCOUNTER — Ambulatory Visit (INDEPENDENT_AMBULATORY_CARE_PROVIDER_SITE_OTHER): Payer: BC Managed Care – PPO | Admitting: Family

## 2017-08-30 ENCOUNTER — Ambulatory Visit (INDEPENDENT_AMBULATORY_CARE_PROVIDER_SITE_OTHER): Payer: BC Managed Care – PPO

## 2017-08-30 DIAGNOSIS — S92211A Displaced fracture of cuboid bone of right foot, initial encounter for closed fracture: Secondary | ICD-10-CM | POA: Diagnosis not present

## 2017-08-30 DIAGNOSIS — M79671 Pain in right foot: Secondary | ICD-10-CM | POA: Diagnosis not present

## 2017-08-30 NOTE — Progress Notes (Signed)
Office Visit Note   Patient: Holly CrankerDonna M Chalker           Date of Birth: 11/15/1960           MRN: [id number] Visit Date: 08/30/2017              Requested by: Merri BrunetteSmith, Candace, MD [phone] [address] [place], [place] [address] PCP: Merri BrunetteSmith, Candace, MD  No chief complaint on file.     HPI: Patient is a 56 year old woman seen today for evaluation of left foot pain. Was walking her dog 2 days ago and stepped wrong, has been having lateral foot pain since. Does relate that last night she also stubbed her 5th toe on a sewing machine. Is concerned for fracture of 5th toe as well. Swelling and bruising to toe and foot. Increased pain with ambulation.  Assessment & Plan: Visit Diagnoses:  1. Right foot pain     Plan: concern for small cuboid avulsion fracture right foot. placed in a post op shoe. Will return in 2 weeks with radiographs of right foot.  Follow-Up Instructions: No Follow-up on file.   Ortho Exam  Patient is alert, oriented, no adenopathy, well-dressed, normal affect, normal respiratory effort. Tender along 4th MT shaft proximally. DIP of 5th toe tender and painful rom. Minimal swelling. Slight erythema to 5th toe. No swelling or bruising to dorsolateral foot.   Imaging: No results found. No images are attached to the encounter.  Labs: Lab Results  Component Value Date   HGBA1C 7.1 (H) 08/29/2015   HGBA1C  05/29/2007    6.0 (NOTE)   The ADA recommends the following therapeutic goals for glycemic   control related to Hgb A1C measurement:   Goal of Therapy:   < 7.0% Hgb A1C   Action Suggested:  > 8.0% Hgb A1C   Ref:  Diabetes Care, 22, Suppl. 1, 1999   REPTSTATUS 10/15/2016 FINAL 10/13/2016   CULT  10/13/2016    NO GROUP A STREP (S.PYOGENES) ISOLATED Performed at [facility]     @LABSALLVALUES (HGBA1)@  @BMI1 @  Orders:  Orders Placed This Encounter  Procedures  . XR Foot Complete Right   No orders of the defined types were placed in this  encounter.    Procedures: No procedures performed  Clinical Data: No additional findings.  ROS:  All other systems negative, except as noted in the HPI. Review of Systems  Constitutional: Negative for chills and fever.  Musculoskeletal: Positive for arthralgias.  Skin: Positive for color change. Negative for wound.    Objective: Vital Signs: There were no vitals taken for this visit.  Specialty Comments:  No specialty comments available.  PMFS History: Patient Active Problem List   Diagnosis Date Noted  . DJD (degenerative joint disease), cervical 09/18/2016  . Other fatigue 09/18/2016  . Fibromyalgia 09/18/2016  . Primary osteoarthritis of both knees 09/18/2016  . Primary insomnia 09/18/2016  . Vitamin D deficiency 09/18/2016  . Carpal tunnel syndrome, right 09/05/2015    Class: Chronic  . Trigger finger, acquired 09/05/2015    Class: Chronic  . Pulmonary nodules 01/16/2013  . Dyspnea 01/13/2013   Past Medical History:  Diagnosis Date  . Anxiety    takes Ativan daily  . Back pain    stenosis and buldging disc  . Bone spur    neck and buldging disc  . Depression    takes Cymbalta daily  . Diabetes (HCC)    takes Metformin daily  . Diverticulosis   .  GERD (gastroesophageal reflux disease)    takes Prevacid daily  . History of bronchitis    > 5 yrs ago  . History of hiatal hernia   . Hypothyroidism    takes Synthroid daily  . IBS (irritable bowel syndrome)   . Insomnia    doesn't take any meds  . Joint pain   . Lung nodule    right middle lobe-was being followed by Dr.Burney.Medical Md is keeping up with this  . Migraine   . Migraine    last one 08/28/15  . Nocturia   . Numbness and tingling in hands   . Sleep apnea   . Thoracic outlet syndrome   . Urinary urgency     Family History  Problem Relation Age of Onset  . COPD Mother   . Cancer Mother   . Heart disease Paternal Grandmother   . Heart disease Paternal Grandfather   . Heart  disease Maternal Grandmother   . Heart disease Maternal Grandfather     Past Surgical History:  Procedure Laterality Date  . ABDOMINAL EXPLORATION SURGERY     cancer cells in cervix  . CARPAL TUNNEL RELEASE Right 09/05/2015   Procedure: RIGHT OPEN CARPAL TUNNEL RELEASE, RIGHT LONG FINGER TRIGGER RELEASE,RIGHT INDEX FINGER TRIGGER RELEASE;  Surgeon: Kerrin ChampagneJames E Nitka, MD;  Location: [facility] OR;  Service: Orthopedics;  Laterality: Right;  . CHOLECYSTECTOMY    . COLONOSCOPY    . ESOPHAGOGASTRODUODENOSCOPY    . THYROIDECTOMY    . TONSILLECTOMY     adenoidectomy  . TRIGGER FINGER RELEASE Right 09/05/2015   Procedure: RELEASE TRIGGER FINGER/A-1 PULLEY;  Surgeon: Kerrin ChampagneJames E Nitka, MD;  Location: [facility] OR;  Service: Orthopedics;  Laterality: Right;   Social History   Occupational History  . Not on file  Tobacco Use  . Smoking status: Former Smoker    Types: Cigarettes  . Smokeless tobacco: Never Used  Substance and Sexual Activity  . Alcohol use: No  . Drug use: No  . Sexual activity: Yes    Birth control/protection: Post-menopausal

## 2017-09-13 ENCOUNTER — Ambulatory Visit (INDEPENDENT_AMBULATORY_CARE_PROVIDER_SITE_OTHER): Payer: BC Managed Care – PPO | Admitting: Family

## 2017-10-02 DIAGNOSIS — F323 Major depressive disorder, single episode, severe with psychotic features: Secondary | ICD-10-CM | POA: Diagnosis not present

## 2017-10-02 DIAGNOSIS — G43719 Chronic migraine without aura, intractable, without status migrainosus: Secondary | ICD-10-CM | POA: Diagnosis not present

## 2017-10-31 DIAGNOSIS — K7581 Nonalcoholic steatohepatitis (NASH): Secondary | ICD-10-CM | POA: Diagnosis not present

## 2017-10-31 DIAGNOSIS — K219 Gastro-esophageal reflux disease without esophagitis: Secondary | ICD-10-CM | POA: Diagnosis not present

## 2017-12-10 DIAGNOSIS — R079 Chest pain, unspecified: Secondary | ICD-10-CM | POA: Diagnosis not present

## 2017-12-10 DIAGNOSIS — R6889 Other general symptoms and signs: Secondary | ICD-10-CM | POA: Diagnosis not present

## 2017-12-10 DIAGNOSIS — R69 Illness, unspecified: Secondary | ICD-10-CM | POA: Diagnosis not present

## 2018-01-03 DIAGNOSIS — F419 Anxiety disorder, unspecified: Secondary | ICD-10-CM | POA: Diagnosis not present

## 2018-01-03 DIAGNOSIS — M797 Fibromyalgia: Secondary | ICD-10-CM | POA: Diagnosis not present

## 2018-01-03 DIAGNOSIS — F329 Major depressive disorder, single episode, unspecified: Secondary | ICD-10-CM | POA: Diagnosis not present

## 2018-01-03 DIAGNOSIS — G43719 Chronic migraine without aura, intractable, without status migrainosus: Secondary | ICD-10-CM | POA: Diagnosis not present

## 2018-01-06 DIAGNOSIS — F329 Major depressive disorder, single episode, unspecified: Secondary | ICD-10-CM | POA: Diagnosis not present

## 2018-01-06 DIAGNOSIS — E1165 Type 2 diabetes mellitus with hyperglycemia: Secondary | ICD-10-CM | POA: Diagnosis not present

## 2018-01-06 DIAGNOSIS — E039 Hypothyroidism, unspecified: Secondary | ICD-10-CM | POA: Diagnosis not present

## 2018-01-06 DIAGNOSIS — E11319 Type 2 diabetes mellitus with unspecified diabetic retinopathy without macular edema: Secondary | ICD-10-CM | POA: Diagnosis not present

## 2018-01-31 DIAGNOSIS — E039 Hypothyroidism, unspecified: Secondary | ICD-10-CM | POA: Diagnosis not present

## 2018-01-31 DIAGNOSIS — F329 Major depressive disorder, single episode, unspecified: Secondary | ICD-10-CM | POA: Diagnosis not present

## 2018-02-07 ENCOUNTER — Ambulatory Visit (INDEPENDENT_AMBULATORY_CARE_PROVIDER_SITE_OTHER): Payer: BC Managed Care – PPO | Admitting: Specialist

## 2018-02-07 ENCOUNTER — Encounter (INDEPENDENT_AMBULATORY_CARE_PROVIDER_SITE_OTHER): Payer: Self-pay | Admitting: Specialist

## 2018-02-07 ENCOUNTER — Ambulatory Visit (INDEPENDENT_AMBULATORY_CARE_PROVIDER_SITE_OTHER): Payer: BC Managed Care – PPO

## 2018-02-07 VITALS — BP 119/81 | HR 84 | Ht 65.0 in | Wt 205.0 lb

## 2018-02-07 DIAGNOSIS — M25512 Pain in left shoulder: Secondary | ICD-10-CM

## 2018-02-07 DIAGNOSIS — M542 Cervicalgia: Secondary | ICD-10-CM

## 2018-02-07 DIAGNOSIS — M5030 Other cervical disc degeneration, unspecified cervical region: Secondary | ICD-10-CM

## 2018-02-07 NOTE — Patient Instructions (Signed)
Plan: Avoid bending, stooping and avoid lifting weights greater than 10 lbs. Avoid prolong standing and walking. Avoid frequent bending and stooping  No lifting greater than 10 lbs. May use ice or moist heat for pain. Weight loss is of benefit. Handicap license is approved.  Avoid overhead lifting and overhead use of the arms. Do not lift greater than 5 lbs. Adjust head rest in vehicle to prevent hyperextension if rear ended. Take extra precautions to avoid falling. Consider a pool exercise program and yoga Call your primary care physician to arrange an appointment to discuss the neck and chest and arm pain and how this could also Relate to a different cause other than neck such as heart or liver.  Order MRI of the cervical spine to assess for a worsening of left C6-7 disc bulge or protrusion that may be associated with this pain also.  Motrin for shoulder and home exercising.

## 2018-02-07 NOTE — Progress Notes (Addendum)
Office Visit Note   Patient: Holly [name]           Date of Birth: 09-Jun-1961           MRN: [id number] Visit Date: 02/07/2018              Requested by: Merri Brunette, MD [phone] [address] [place], [place] [address] PCP: Merri Brunette, MD   Assessment & Plan: Visit Diagnoses:  1. Acute pain of left shoulder   2. Cervicalgia   3. Other cervical disc degeneration, unspecified cervical region   57 year old right handed female with a long history of neck and arm pain. She has had improvement with ESI in the Past. Her pain is worsening. She has a history of chronic pain, migraine headaches. Her studies have show disc protrusion Left C6-7 last MRI 2014. I will repeat her MRI to assess for worsening protrusion but also recommended she discuss the concerns of Left shoulder pain and upper chest discomfort and neck pain with her primary care physician to be sure of no underlying cardia cause Or viseral cause for this pain.   Plan: Avoid bending, stooping and avoid lifting weights greater than 10 lbs. Avoid prolong standing and walking. Avoid frequent bending and stooping  No lifting greater than 10 lbs. May use ice or moist heat for pain. Weight loss is of benefit. Handicap license is approved.  Avoid overhead lifting and overhead use of the arms. Do not lift greater than 5 lbs. Adjust head rest in vehicle to prevent hyperextension if rear ended. Take extra precautions to avoid falling. Consider a pool exercise program and yoga Call your primary care physician to arrange an appointment to discuss the neck and chest and arm pain and how this could also Relate to a different cause other than neck such as heart or liver.  Order MRI of the cervical spine to assess for a worsening of left C6-7 disc bulge or protrusion that may be associated with this pain Also.     Follow-Up Instructions: No follow-ups on file.   Orders:  Orders Placed This Encounter  Procedures  .  XR Shoulder Left  . XR Cervical Spine 2 or 3 views  . MR Cervical Spine w/o contrast   No orders of the defined types were placed in this encounter.     Procedures: No procedures performed   Clinical Data: No additional findings.   Subjective: Chief Complaint  Patient presents with  . Neck - Pain  . Left Shoulder - Pain    57 year old female right hand dominant with left neck pain and radiation into the left shoulder. She is concerned that the pain pattern is into the left shoulder and could be cardiac. She has migraine HAs and is being seen in  [place] and undergoing Botox injections and the number of epsodes is decreased to about 13 per month and In the past was about 22 times previously. The neurologists have taken care of her head aches but she reports that they have not offered any suggestions concerning her neck and left anterior upper chest discomfort. She reportedly has thoracic outlet syndrome. She has a triggering of the left long finger. Father with A-fib. Mother with  Kidney ca and mets and passed from this disease stroke and diabetes.    Review of Systems  Constitutional: Positive for activity change, appetite change and unexpected weight change. Negative for fever.  HENT: Negative.   Eyes: Negative.  Respiratory: Negative for apnea, cough, choking, chest tightness, shortness of breath, wheezing and stridor.   Cardiovascular: Positive for chest pain.  Gastrointestinal: Positive for abdominal pain, nausea and vomiting. Negative for abdominal distention.  Endocrine: Negative.   Genitourinary: Negative.  Negative for difficulty urinating, dyspareunia, dysuria, enuresis, flank pain, frequency and hematuria.  Musculoskeletal: Positive for back pain, neck pain and neck stiffness.  Skin: Negative.  Negative for color change, pallor, rash and wound.  Allergic/Immunologic: Negative.   Neurological: Positive for weakness and numbness.  Hematological: Negative.  Negative  for adenopathy. Does not bruise/bleed easily.  Psychiatric/Behavioral: Positive for decreased concentration, dysphoric mood and sleep disturbance. Negative for agitation, behavioral problems, confusion, hallucinations, self-injury and suicidal ideas. The patient is not nervous/anxious and is not hyperactive.      Objective: Vital Signs: BP 119/81 (BP Location: Left Arm, Patient Position: Sitting)   Pulse 84   Ht  (1.651 m)   Wt 205 lb (93 kg)   BMI 34.11 kg/m   Physical Exam  Constitutional: She is oriented to person, place, and time. She appears well-developed and well-nourished.  HENT:  Head: Normocephalic and atraumatic.  Eyes: Pupils are equal, round, and reactive to light. EOM are normal.  Neck: Normal range of motion. Neck supple.  Pulmonary/Chest: Effort normal and breath sounds normal.  Abdominal: Soft. Bowel sounds are normal.  Musculoskeletal: Normal range of motion.  Neurological: She is alert and oriented to person, place, and time.  Skin: Skin is warm and dry.  Psychiatric: She has a normal mood and affect. Her behavior is normal. Judgment and thought content normal.    Ortho Exam  Specialty Comments:  No specialty comments available.  Imaging: No results found.   PMFS History: Patient Active Problem List   Diagnosis Date Noted  . Carpal tunnel syndrome, right 09/05/2015    Priority: High    Class: Chronic  . Trigger finger, acquired 09/05/2015    Priority: High    Class: Chronic  . DJD (degenerative joint disease), cervical 09/18/2016  . Other fatigue 09/18/2016  . Fibromyalgia 09/18/2016  . Primary osteoarthritis of both knees 09/18/2016  . Primary insomnia 09/18/2016  . Vitamin D deficiency 09/18/2016  . Pulmonary nodules 01/16/2013  . Dyspnea 01/13/2013   Past Medical History:  Diagnosis Date  . Anxiety    takes Ativan daily  . Back pain    stenosis and buldging disc  . Bone spur    neck and buldging disc  . Depression    takes  Cymbalta daily  . Diabetes (HCC)    takes Metformin daily  . Diverticulosis   . GERD (gastroesophageal reflux disease)    takes Prevacid daily  . History of bronchitis    > 5 yrs ago  . History of hiatal hernia   . Hypothyroidism    takes Synthroid daily  . IBS (irritable bowel syndrome)   . Insomnia    doesn't take any meds  . Joint pain   . Lung nodule    right middle lobe-was being followed by Dr.Burney.Medical Md is keeping up with this  . Migraine   . Migraine    last one 08/28/15  . Nocturia   . Numbness and tingling in hands   . Sleep apnea   . Thoracic outlet syndrome   . Urinary urgency     Family History  Problem Relation Age of Onset  . COPD Mother   . Cancer Mother   . Heart disease Paternal Grandmother   .  Heart disease Paternal Grandfather   . Heart disease Maternal Grandmother   . Heart disease Maternal Grandfather     Past Surgical History:  Procedure Laterality Date  . ABDOMINAL EXPLORATION SURGERY     cancer cells in cervix  . CARPAL TUNNEL RELEASE Right 09/05/2015   Procedure: RIGHT OPEN CARPAL TUNNEL RELEASE, RIGHT LONG FINGER TRIGGER RELEASE,RIGHT INDEX FINGER TRIGGER RELEASE;  Surgeon: Kerrin Champagne, MD;  Location: [facility] OR;  Service: Orthopedics;  Laterality: Right;  . CHOLECYSTECTOMY    . COLONOSCOPY    . ESOPHAGOGASTRODUODENOSCOPY    . THYROIDECTOMY    . TONSILLECTOMY     adenoidectomy  . TRIGGER FINGER RELEASE Right 09/05/2015   Procedure: RELEASE TRIGGER FINGER/A-1 PULLEY;  Surgeon: Kerrin Champagne, MD;  Location: [facility] OR;  Service: Orthopedics;  Laterality: Right;   Social History   Occupational History  . Not on file  Tobacco Use  . Smoking status: Former Smoker    Types: Cigarettes  . Smokeless tobacco: Never Used  Substance and Sexual Activity  . Alcohol use: No  . Drug use: No  . Sexual activity: Yes    Birth control/protection: Post-menopausal

## 2018-02-15 ENCOUNTER — Ambulatory Visit
Admission: RE | Admit: 2018-02-15 | Discharge: 2018-02-15 | Disposition: A | Discharge status: Home / Self Care | Payer: BC Managed Care – PPO | Source: Ambulatory Visit | Attending: Specialist | Admitting: Specialist

## 2018-02-15 DIAGNOSIS — M4802 Spinal stenosis, cervical region: Secondary | ICD-10-CM | POA: Diagnosis not present

## 2018-02-15 DIAGNOSIS — M5030 Other cervical disc degeneration, unspecified cervical region: Secondary | ICD-10-CM

## 2018-02-15 DIAGNOSIS — M542 Cervicalgia: Secondary | ICD-10-CM

## 2018-03-07 ENCOUNTER — Encounter (INDEPENDENT_AMBULATORY_CARE_PROVIDER_SITE_OTHER): Payer: Self-pay | Admitting: Specialist

## 2018-03-07 ENCOUNTER — Ambulatory Visit (INDEPENDENT_AMBULATORY_CARE_PROVIDER_SITE_OTHER): Payer: BC Managed Care – PPO | Admitting: Specialist

## 2018-03-07 VITALS — BP 122/81 | HR 95 | Ht 65.0 in | Wt 200.0 lb

## 2018-03-07 DIAGNOSIS — M65332 Trigger finger, left middle finger: Secondary | ICD-10-CM | POA: Diagnosis not present

## 2018-03-07 DIAGNOSIS — M5010 Cervical disc disorder with radiculopathy, unspecified cervical region: Secondary | ICD-10-CM

## 2018-03-07 DIAGNOSIS — M5030 Other cervical disc degeneration, unspecified cervical region: Secondary | ICD-10-CM

## 2018-03-07 MED ORDER — DICLOFENAC SODIUM 1 % TD GEL: 2.0000 g | Freq: Four times a day (QID) | TRANSDERMAL | 1 refills | Status: DC

## 2018-03-07 NOTE — Patient Instructions (Signed)
Avoid overhead lifting and overhead use of the arms. Do not lift greater than 10 lbs. Adjust head rest in vehicle to prevent hyperextension if rear ended. Take extra precautions to avoid falling.  Dr. Manele BlasNewton's secretary/Assistant will call to arrange for epidural steroid injection

## 2018-03-07 NOTE — Progress Notes (Signed)
Office Visit Note   Patient: Holly CrankerDonna M Micallef           Date of Birth: 02/26/1961           MRN: [id number] Visit Date: 03/07/2018              Requested by: Merri BrunetteSmith, Candace, MD [phone] [address] [place], [place] [address] PCP: Augustin SchoolingSun, Lisa L, MD   Assessment & Plan: Visit Diagnoses:  1. Herniation of cervical intervertebral disc with radiculopathy   2. Other cervical disc degeneration, unspecified cervical region   3. Trigger finger, left middle finger     Plan:Avoid overhead lifting and overhead use of the arms. Do not lift greater than 10 lbs. Adjust head rest in vehicle to prevent hyperextension if rear ended. Take extra precautions to avoid falling.  Dr. Harriman BlasNewton's secretary/Assistant will call to arrange for epidural steroid injection  Follow-Up Instructions: Return in about 1 month (around 04/04/2018).   Orders:  No orders of the defined types were placed in this encounter.  No orders of the defined types were placed in this encounter.     Procedures: No procedures performed   Clinical Data: No additional findings.   Subjective: Chief Complaint  Patient presents with  . Neck - Pain, Follow-up    S/p MRI Csp  . Left Shoulder - Pain, Follow-up    57 year old female right handed and amidretrious. She is have persistent neck and left shoulder. She has had right CTS and right hand trigger releases at index and right ring finger. She has been having neck pain and discomfort with clumbsiness in the neck and spasm in the neck for some time. Clinically a left C7 pattern.   Review of Systems  Constitutional: Negative.   HENT: Negative.   Eyes: Negative.   Respiratory: Negative.   Cardiovascular: Negative.   Gastrointestinal: Negative.   Endocrine: Negative.   Genitourinary: Negative.   Musculoskeletal: Negative.   Skin: Negative.   Allergic/Immunologic: Negative.   Neurological: Negative.   Hematological: Negative.   Psychiatric/Behavioral: Negative.       Objective: Vital Signs: BP 122/81 (BP Location: Left Arm, Patient Position: Sitting, Cuff Size: Large)   Pulse 95   Ht 5\' 5"  (1.651 m)   Wt 200 lb (90.7 kg)   BMI 33.28 kg/m   Physical Exam  Constitutional: She is oriented to person, place, and time. She appears well-developed and well-nourished.  HENT:  Head: Normocephalic and atraumatic.  Eyes: Pupils are equal, round, and reactive to light. EOM are normal.  Neck: Normal range of motion. Neck supple.  Pulmonary/Chest: Effort normal and breath sounds normal.  Abdominal: Soft. Bowel sounds are normal.  Neurological: She is alert and oriented to person, place, and time.  Skin: Skin is warm and dry.  Psychiatric: She has a normal mood and affect. Her behavior is normal. Judgment and thought content normal.    Back Exam   Tenderness  The patient is experiencing tenderness in the cervical.  Range of Motion  Extension: abnormal  Flexion: normal  Lateral bend left: abnormal  Rotation left: abnormal   Muscle Strength  Right Quadriceps:  5/5  Left Quadriceps:  5/5  Right Hamstrings:  5/5  Left Hamstrings:  5/5   Tests  Straight leg raise right: negative Straight leg raise left: negative  Reflexes  Patellar: normal Achilles: normal Biceps: normal Babinski's sign: normal   Other  Toe walk: normal Heel walk: normal Sensation: normal Gait: abnormal  Comments:  Positive spurling sign left neck. Left EDC and left wrist volar flexion weakness.    Left Hand Exam   Tenderness  The patient is experiencing tenderness in the palmer area.   Range of Motion  Hand  MP Middle: 90  PIP Middle: 90  DIP Middle: 90   Muscle Strength  Wrist extension: 5/5  Wrist flexion: 5/5  Grip:  5/5   Tests  Phalen's Sign: negative Tinel's sign (median nerve): negative Finkelstein's test: negative  Other  Erythema: absent Scars: absent Sensation: normal Pulse: present      Specialty Comments:  No specialty  comments available.  Imaging: No results found.   PMFS History: Patient Active Problem List   Diagnosis Date Noted  . Carpal tunnel syndrome, right 09/05/2015    Priority: High    Class: Chronic  . Trigger finger, acquired 09/05/2015    Priority: High    Class: Chronic  . DJD (degenerative joint disease), cervical 09/18/2016  . Other fatigue 09/18/2016  . Fibromyalgia 09/18/2016  . Primary osteoarthritis of both knees 09/18/2016  . Primary insomnia 09/18/2016  . Vitamin D deficiency 09/18/2016  . Pulmonary nodules 01/16/2013  . Dyspnea 01/13/2013   Past Medical History:  Diagnosis Date  . Anxiety    takes Ativan daily  . Back pain    stenosis and buldging disc  . Bone spur    neck and buldging disc  . Depression    takes Cymbalta daily  . Diabetes (HCC)    takes Metformin daily  . Diverticulosis   . GERD (gastroesophageal reflux disease)    takes Prevacid daily  . History of bronchitis    > 5 yrs ago  . History of hiatal hernia   . Hypothyroidism    takes Synthroid daily  . IBS (irritable bowel syndrome)   . Insomnia    doesn't take any meds  . Joint pain   . Lung nodule    right middle lobe-was being followed by Dr.Burney.Medical Md is keeping up with this  . Migraine   . Migraine    last one 08/28/15  . Nocturia   . Numbness and tingling in hands   . Sleep apnea   . Thoracic outlet syndrome   . Urinary urgency     Family History  Problem Relation Age of Onset  . COPD Mother   . Cancer Mother   . Heart disease Paternal Grandmother   . Heart disease Paternal Grandfather   . Heart disease Maternal Grandmother   . Heart disease Maternal Grandfather     Past Surgical History:  Procedure Laterality Date  . ABDOMINAL EXPLORATION SURGERY     cancer cells in cervix  . CARPAL TUNNEL RELEASE Right 09/05/2015   Procedure: RIGHT OPEN CARPAL TUNNEL RELEASE, RIGHT LONG FINGER TRIGGER RELEASE,RIGHT INDEX FINGER TRIGGER RELEASE;  Surgeon: Kerrin Champagne, MD;   Location: [facility] OR;  Service: Orthopedics;  Laterality: Right;  . CHOLECYSTECTOMY    . COLONOSCOPY    . ESOPHAGOGASTRODUODENOSCOPY    . THYROIDECTOMY    . TONSILLECTOMY     adenoidectomy  . TRIGGER FINGER RELEASE Right 09/05/2015   Procedure: RELEASE TRIGGER FINGER/A-1 PULLEY;  Surgeon: Kerrin Champagne, MD;  Location: [facility] OR;  Service: Orthopedics;  Laterality: Right;   Social History   Occupational History  . Not on file  Tobacco Use  . Smoking status: Former Smoker    Types: Cigarettes  . Smokeless tobacco: Never Used  Substance and Sexual Activity  .  Alcohol use: No  . Drug use: No  . Sexual activity: Yes    Birth control/protection: Post-menopausal

## 2018-03-26 DIAGNOSIS — J069 Acute upper respiratory infection, unspecified: Secondary | ICD-10-CM | POA: Diagnosis not present

## 2018-03-26 DIAGNOSIS — R300 Dysuria: Secondary | ICD-10-CM | POA: Diagnosis not present

## 2018-03-26 DIAGNOSIS — S30861A Insect bite (nonvenomous) of abdominal wall, initial encounter: Secondary | ICD-10-CM | POA: Diagnosis not present

## 2018-03-26 DIAGNOSIS — N3000 Acute cystitis without hematuria: Secondary | ICD-10-CM | POA: Diagnosis not present

## 2018-04-04 DIAGNOSIS — G43719 Chronic migraine without aura, intractable, without status migrainosus: Secondary | ICD-10-CM | POA: Diagnosis not present

## 2018-04-04 DIAGNOSIS — M797 Fibromyalgia: Secondary | ICD-10-CM | POA: Diagnosis not present

## 2018-04-04 DIAGNOSIS — F419 Anxiety disorder, unspecified: Secondary | ICD-10-CM | POA: Diagnosis not present

## 2018-04-04 DIAGNOSIS — R42 Dizziness and giddiness: Secondary | ICD-10-CM | POA: Diagnosis not present

## 2018-04-09 ENCOUNTER — Ambulatory Visit (INDEPENDENT_AMBULATORY_CARE_PROVIDER_SITE_OTHER): Payer: Self-pay

## 2018-04-09 ENCOUNTER — Encounter (INDEPENDENT_AMBULATORY_CARE_PROVIDER_SITE_OTHER): Payer: Self-pay | Admitting: Physical Medicine and Rehabilitation

## 2018-04-09 ENCOUNTER — Ambulatory Visit (INDEPENDENT_AMBULATORY_CARE_PROVIDER_SITE_OTHER): Payer: BC Managed Care – PPO | Admitting: Physical Medicine and Rehabilitation

## 2018-04-09 VITALS — BP 117/79 | HR 98

## 2018-04-09 DIAGNOSIS — M5412 Radiculopathy, cervical region: Secondary | ICD-10-CM | POA: Diagnosis not present

## 2018-04-09 MED ORDER — METHYLPREDNISOLONE ACETATE 80 MG/ML IJ SUSP
80.0000 mg | Freq: Once | INTRAMUSCULAR | Status: AC
  Administered 2018-04-09: 80 mg

## 2018-04-09 NOTE — Patient Instructions (Signed)

## 2018-04-09 NOTE — Progress Notes (Signed)
 .  Numeric Pain Rating Scale and Functional Assessment Average Pain 7   In the last MONTH (on 0-10 scale) has pain interfered with the following?  1. General activity like being  able to carry out your everyday physical activities such as walking, climbing stairs, carrying groceries, or moving a chair?  Rating(4)   +Driver, -BT, -Dye Allergies.  

## 2018-04-09 NOTE — Procedures (Signed)
Cervical Epidural Steroid Injection - Interlaminar Approach with Fluoroscopic Guidance  Patient: Holly CrankerDonna M Vittorio      Date of Birth: 06/13/1961 MRN: [id number] PCP: Augustin SchoolingSun, Lisa L, MD      Visit Date: 04/09/2018   Universal Protocol:    Date/Time: 07/10/192:24 PM  Consent Given By: the patient  Position: PRONE  Additional Comments: Vital signs were monitored before and after the procedure. Patient was prepped and draped in the usual sterile fashion. The correct patient, procedure, and site was verified.   Injection Procedure Details:  Procedure Site One Meds Administered:  Meds ordered this encounter  Medications  . methylPREDNISolone acetate (DEPO-MEDROL) injection 80 mg     Laterality: Left  Location/Site: C7-T1  Needle size: 20 G  Needle type: Touhy  Needle Placement: Paramedian epidural space  Findings:  -Comments: Excellent flow of contrast into the epidural space.  Procedure Details: Using a paramedian approach from the side mentioned above, the region overlying the inferior lamina was localized under fluoroscopic visualization and the soft tissues overlying this structure were infiltrated with 4 ml. of 1% Lidocaine without Epinephrine. A # 20 gauge, Tuohy needle was inserted into the epidural space using a paramedian approach.  The epidural space was localized using loss of resistance along with lateral and contralateral oblique bi-planar fluoroscopic views.  After negative aspirate for air, blood, and CSF, a 2 ml. volume of Isovue-250 was injected into the epidural space and the flow of contrast was observed. Radiographs were obtained for documentation purposes.   The injectate was administered into the level noted above.  Additional Comments:  The patient tolerated the procedure well Dressing: Band-Aid    Post-procedure details: Patient was observed during the procedure. Post-procedure instructions were reviewed.  Patient left the clinic in stable  condition.

## 2018-04-22 NOTE — Progress Notes (Signed)
Cecil CrankerDonna M Paczkowski - 57 y.o. female MRN [id number]  Date of birth: 05/02/1961  Office Visit Note: Visit Date: 04/09/2018 PCP: Augustin SchoolingSun, Lisa L, MD Referred by: Augustin SchoolingSun, Lisa L, MD  Subjective: Chief Complaint  Patient presents with  . Neck - Pain  . Left Shoulder - Pain  . Left Arm - Pain, Numbness   HPI: [name] is a pleasant 57 year old right-hand-dominant female that comes in today at the request of Dr. Vira BrownsJames Nitka for diagnostic and hopefully therapeutic left C7-T1 interlaminar epidural steroid injection.  She is been having chronic worsening left neck shoulder and arm pain.  MRI findings show C6-7 left disc herniation with some flattening of the hemi-cord without cord signal change.  She is failed conservative care and this can be reviewed through Dr. Barbaraann FasterNitka's notes.   ROS Otherwise per HPI.  Assessment & Plan: Visit Diagnoses:  1. Cervical radiculopathy     Plan: No additional findings.   Meds & Orders:  Meds ordered this encounter  Medications  . methylPREDNISolone acetate (DEPO-MEDROL) injection 80 mg    Orders Placed This Encounter  Procedures  . XR C-ARM NO REPORT  . Epidural Steroid injection    Follow-up: Return if symptoms worsen or fail to improve.   Procedures: No procedures performed  Cervical Epidural Steroid Injection - Interlaminar Approach with Fluoroscopic Guidance  Patient: [name]      Date of Birth: 11/11/1960 MRN: [id number] PCP: Augustin SchoolingSun, Lisa L, MD      Visit Date: 04/09/2018   Universal Protocol:    Date/Time: 07/10/192:24 PM  Consent Given By: the patient  Position: PRONE  Additional Comments: Vital signs were monitored before and after the procedure. Patient was prepped and draped in the usual sterile fashion. The correct patient, procedure, and site was verified.   Injection Procedure Details:  Procedure Site One Meds Administered:  Meds ordered this encounter  Medications  . methylPREDNISolone acetate (DEPO-MEDROL)  injection 80 mg     Laterality: Left  Location/Site: C7-T1  Needle size: 20 G  Needle type: Touhy  Needle Placement: Paramedian epidural space  Findings:  -Comments: Excellent flow of contrast into the epidural space.  Procedure Details: Using a paramedian approach from the side mentioned above, the region overlying the inferior lamina was localized under fluoroscopic visualization and the soft tissues overlying this structure were infiltrated with 4 ml. of 1% Lidocaine without Epinephrine. A # 20 gauge, Tuohy needle was inserted into the epidural space using a paramedian approach.  The epidural space was localized using loss of resistance along with lateral and contralateral oblique bi-planar fluoroscopic views.  After negative aspirate for air, blood, and CSF, a 2 ml. volume of Isovue-250 was injected into the epidural space and the flow of contrast was observed. Radiographs were obtained for documentation purposes.   The injectate was administered into the level noted above.  Additional Comments:  The patient tolerated the procedure well Dressing: Band-Aid    Post-procedure details: Patient was observed during the procedure. Post-procedure instructions were reviewed.  Patient left the clinic in stable condition.    Clinical History: MRI CERVICAL SPINE WITHOUT CONTRAST  TECHNIQUE: Multiplanar, multisequence MR imaging of the cervical spine was performed. No intravenous contrast was administered.  COMPARISON:  Prior radiographs from 02/07/2018.  FINDINGS: Alignment: Straightening of the normal cervical lordosis. No listhesis.  Vertebrae: Vertebral body heights maintained without evidence for acute or chronic fracture. Bone marrow signal intensity within normal limits. Benign hemangiomas noted within the C6 and T3  vertebral bodies. No worrisome osseous lesions.  Cord: Signal intensity within the cervical spinal cord is normal.  Posterior Fossa, vertebral  arteries, paraspinal tissues: Visualized brain and posterior fossa within normal limits. Craniocervical junction normal. Paraspinous and prevertebral soft tissues normal. Normal intravascular flow voids present within the vertebral arteries bilaterally.  Disc levels:  C2-C3: Unremarkable.  C3-C4:  Mild annular disc bulge.  No significant stenosis.  C4-C5: Mild disc bulge with bilateral uncovertebral hypertrophy. No canal stenosis. Mild bilateral C5 foraminal narrowing.  C5-C6: Mild disc bulge with bilateral uncovertebral hypertrophy. No significant canal stenosis. Mild bilateral C6 foraminal narrowing.  C6-C7: Broad left paracentral disc protrusion indents the left ventral thecal sac. Secondary mild flattening of the left hemi cord without cord signal changes. Mild left-sided spinal stenosis. Mild left C7 foraminal stenosis. No right foraminal encroachment.  C7-T1: Bilateral facet hypertrophy. No canal stenosis. No significant foraminal encroachment.  Visualized upper thoracic spine within normal limits.  IMPRESSION: 1. Shallow left paracentral disc protrusion at C6-7 with secondary mild flattening of the left hemi cord and mild left C7 foraminal stenosis. Query left C7 radiculitis. 2. Mild disc bulge and uncovertebral hypertrophy at C4-5 and C5-6 with resultant mild bilateral C5 and C6 foraminal stenosis.   Electronically Signed   By: Rise Mu M.D.   On: 02/16/2018 06:58   She reports that she has quit smoking. Her smoking use included cigarettes. She has never used smokeless tobacco. No results for input(s): HGBA1C, LABURIC in the last 8760 hours.  Objective:  VS:  HT:    WT:   BMI:     BP:117/79  HR:98bpm  TEMP: ( )  RESP:  Physical Exam  Ortho Exam Imaging: No results found.  Past Medical/Family/Surgical/Social History: Medications & Allergies reviewed per EMR, new medications updated. Patient Active Problem List   Diagnosis Date  Noted  . DJD (degenerative joint disease), cervical 09/18/2016  . Other fatigue 09/18/2016  . Fibromyalgia 09/18/2016  . Primary osteoarthritis of both knees 09/18/2016  . Primary insomnia 09/18/2016  . Vitamin D deficiency 09/18/2016  . Carpal tunnel syndrome, right 09/05/2015    Class: Chronic  . Trigger finger, acquired 09/05/2015    Class: Chronic  . Pulmonary nodules 01/16/2013  . Dyspnea 01/13/2013   Past Medical History:  Diagnosis Date  . Anxiety    takes Ativan daily  . Back pain    stenosis and buldging disc  . Bone spur    neck and buldging disc  . Depression    takes Cymbalta daily  . Diabetes (HCC)    takes Metformin daily  . Diverticulosis   . GERD (gastroesophageal reflux disease)    takes Prevacid daily  . History of bronchitis    > 5 yrs ago  . History of hiatal hernia   . Hypothyroidism    takes Synthroid daily  . IBS (irritable bowel syndrome)   . Insomnia    doesn't take any meds  . Joint pain   . Lung nodule    right middle lobe-was being followed by Dr.Burney.Medical Md is keeping up with this  . Migraine   . Migraine    last one 08/28/15  . Nocturia   . Numbness and tingling in hands   . Sleep apnea   . Thoracic outlet syndrome   . Urinary urgency    Family History  Problem Relation Age of Onset  . COPD Mother   . Cancer Mother   . Heart disease Paternal Grandmother   . Heart  disease Paternal Grandfather   . Heart disease Maternal Grandmother   . Heart disease Maternal Grandfather    Past Surgical History:  Procedure Laterality Date  . ABDOMINAL EXPLORATION SURGERY     cancer cells in cervix  . CARPAL TUNNEL RELEASE Right 09/05/2015   Procedure: RIGHT OPEN CARPAL TUNNEL RELEASE, RIGHT LONG FINGER TRIGGER RELEASE,RIGHT INDEX FINGER TRIGGER RELEASE;  Surgeon: Kerrin Champagne, MD;  Location: [facility] OR;  Service: Orthopedics;  Laterality: Right;  . CHOLECYSTECTOMY    . COLONOSCOPY    . ESOPHAGOGASTRODUODENOSCOPY    . THYROIDECTOMY    .  TONSILLECTOMY     adenoidectomy  . TRIGGER FINGER RELEASE Right 09/05/2015   Procedure: RELEASE TRIGGER FINGER/A-1 PULLEY;  Surgeon: Kerrin Champagne, MD;  Location: [facility] OR;  Service: Orthopedics;  Laterality: Right;   Social History   Occupational History  . Not on file  Tobacco Use  . Smoking status: Former Smoker    Types: Cigarettes  . Smokeless tobacco: Never Used  Substance and Sexual Activity  . Alcohol use: No  . Drug use: No  . Sexual activity: Yes    Birth control/protection: Post-menopausal

## 2018-05-09 DIAGNOSIS — J069 Acute upper respiratory infection, unspecified: Secondary | ICD-10-CM | POA: Diagnosis not present

## 2018-05-09 DIAGNOSIS — K760 Fatty (change of) liver, not elsewhere classified: Secondary | ICD-10-CM | POA: Diagnosis not present

## 2018-05-09 DIAGNOSIS — R740 Nonspecific elevation of levels of transaminase and lactic acid dehydrogenase [LDH]: Secondary | ICD-10-CM | POA: Diagnosis not present

## 2018-05-09 DIAGNOSIS — F330 Major depressive disorder, recurrent, mild: Secondary | ICD-10-CM | POA: Diagnosis not present

## 2018-05-15 DIAGNOSIS — G4733 Obstructive sleep apnea (adult) (pediatric): Secondary | ICD-10-CM | POA: Diagnosis not present

## 2018-05-15 DIAGNOSIS — G4719 Other hypersomnia: Secondary | ICD-10-CM | POA: Diagnosis not present

## 2018-05-20 DIAGNOSIS — K5904 Chronic idiopathic constipation: Secondary | ICD-10-CM | POA: Diagnosis not present

## 2018-05-20 DIAGNOSIS — K219 Gastro-esophageal reflux disease without esophagitis: Secondary | ICD-10-CM | POA: Diagnosis not present

## 2018-05-20 DIAGNOSIS — K7581 Nonalcoholic steatohepatitis (NASH): Secondary | ICD-10-CM | POA: Diagnosis not present

## 2018-05-20 DIAGNOSIS — E669 Obesity, unspecified: Secondary | ICD-10-CM | POA: Diagnosis not present

## 2018-05-27 ENCOUNTER — Ambulatory Visit (INDEPENDENT_AMBULATORY_CARE_PROVIDER_SITE_OTHER): Payer: BC Managed Care – PPO | Admitting: Orthopedic Surgery

## 2018-05-27 ENCOUNTER — Encounter (INDEPENDENT_AMBULATORY_CARE_PROVIDER_SITE_OTHER): Payer: Self-pay | Admitting: Orthopedic Surgery

## 2018-05-27 DIAGNOSIS — L03011 Cellulitis of right finger: Secondary | ICD-10-CM | POA: Diagnosis not present

## 2018-05-27 MED ORDER — CLINDAMYCIN HCL 300 MG PO CAPS: 300.0000 mg | ORAL_CAPSULE | Freq: Three times a day (TID) | ORAL | 0 refills | Status: DC

## 2018-05-27 NOTE — Progress Notes (Signed)
Office Visit Note   Patient: Holly CrankerDonna M [name]           Date of Birth: 03/11/1961           MRN: [id number] Visit Date: 05/27/2018 Requested by: Augustin SchoolingSun, Lisa L, MD [address] [place], [place] [id number] PCP: Augustin SchoolingSun, Lisa L, MD  Subjective: Chief Complaint  Patient presents with  . finger    HPI: Holly [name] is a patient with right finger pain.  This is a middle finger issue.  She describes having a hangnail which she has been treating for the last month.  Became very painful.  She tried to decompress the infection on her own.  This was not successful.  She is also been trying other modalities such as Betadine soaks in order to decompress this area.  She denies any fevers or chills.  She does have a history of diabetes.             ROS:All systems reviewed are negative as they relate to the chief complaint within the history of present illness.  Patient denies  fevers or chills.  ssment & Plan: Visit Diagnoses:  1. Paronychia of finger of right hand     Plan: Impression is paronychia right middle finger.  Plan is incision and drainage of this paronychia.  This is done today in clinic.  Operative note to follow.  Patient tolerated the procedure well and we will put her on clindamycin 300 mg twice a day for 5 days.  She will also need to pull the packing on Thursday and begin Betadine and saline soaks twice a day for the finger.  Preop diagnosis right middle finger paronychia Postop diagnosis same Procedure is right middle finger paronychia incision and drainage Surgeon attending Dorene GrebeScott Dean Indications Holly [name] is a patient with 1 month history of right middle finger paronychia which is failed conservative management she presents now for operative management  Anesthesia local digital block with lidocaine without epinephrine  Procedure detail After informed consent was obtained the patient underwent prepping and draping of the right middle finger with Betadine and and alcohol.  Digital block was  administered with about 10 cc of plain lidocaine.  Once adequate analgesia was obtained the distal fingertip was prepped with alcohol and Betadine.  Finger tourniquet was applied for approximately 4 minutes.  Using a freer elevator the hyponychial skin fold was elevated from the nail.  The nail was freed and removed approximately 4 to 5 mm sliver.  This allowed that space to be decompressed further.  Thorough irrigation of the space was then performed with irrigating solution.  Iodoform packing was placed.  Tourniquet was released and a loose dressing placed.  Patient will follow-up in 1 week for recheck.  She will remove the dressing on Thursday and take out the packing.  Follow-Up Instructions: Return in about 1 week (around 06/03/2018).   Orders:  No orders of the defined types were placed in this encounter.  Meds ordered this encounter  Medications  . clindamycin (CLEOCIN) 300 MG capsule    Sig: Take 1 capsule (300 mg total) by mouth 3 (three) times daily.    Dispense:  10 capsule    Refill:  0      Procedures: No procedures performed   Clinical Data: No additional findings.  Objective: Vital Signs: There were no vitals taken for this visit.  Physical Exam:   Constitutional: Patient appears well-developed HEENT:  Head: Normocephalic Eyes:EOM are normal Neck: Normal range of motion  Cardiovascular: Normal rate Pulmonary/chest: Effort normal Neurologic: Patient is alert Skin: Skin is warm Psychiatric: Patient has normal mood and affect    Ortho Exam: Ortho exam demonstrates full active and passive range of motion of the finger but there is redness tenderness and erythema around the ulnar aspect of the nail.  This does not extend past the midline to the radial side.  This area is tender to touch.  Extension is intact.  No proximal lymphadenopathy is present.  Specialty Comments:  No specialty comments available.  Imaging: No results found.   PMFS History: Patient  Active Problem List   Diagnosis Date Noted  . DJD (degenerative joint disease), cervical 09/18/2016  . Other fatigue 09/18/2016  . Fibromyalgia 09/18/2016  . Primary osteoarthritis of both knees 09/18/2016  . Primary insomnia 09/18/2016  . Vitamin D deficiency 09/18/2016  . Carpal tunnel syndrome, right 09/05/2015    Class: Chronic  . Trigger finger, acquired 09/05/2015    Class: Chronic  . Pulmonary nodules 01/16/2013  . Dyspnea 01/13/2013   Past Medical History:  Diagnosis Date  . Anxiety    takes Ativan daily  . Back pain    stenosis and buldging disc  . Bone spur    neck and buldging disc  . Depression    takes Cymbalta daily  . Diabetes (HCC)    takes Metformin daily  . Diverticulosis   . GERD (gastroesophageal reflux disease)    takes Prevacid daily  . History of bronchitis    > 5 yrs ago  . History of hiatal hernia   . Hypothyroidism    takes Synthroid daily  . IBS (irritable bowel syndrome)   . Insomnia    doesn't take any meds  . Joint pain   . Lung nodule    right middle lobe-was being followed by Dr.Burney.Medical Md is keeping up with this  . Migraine   . Migraine    last one 08/28/15  . Nocturia   . Numbness and tingling in hands   . Sleep apnea   . Thoracic outlet syndrome   . Urinary urgency     Family History  Problem Relation Age of Onset  . COPD Mother   . Cancer Mother   . Heart disease Paternal Grandmother   . Heart disease Paternal Grandfather   . Heart disease Maternal Grandmother   . Heart disease Maternal Grandfather     Past Surgical History:  Procedure Laterality Date  . ABDOMINAL EXPLORATION SURGERY     cancer cells in cervix  . CARPAL TUNNEL RELEASE Right 09/05/2015   Procedure: RIGHT OPEN CARPAL TUNNEL RELEASE, RIGHT LONG FINGER TRIGGER RELEASE,RIGHT INDEX FINGER TRIGGER RELEASE;  Surgeon: Kerrin Champagne, MD;  Location: [facility] OR;  Service: Orthopedics;  Laterality: Right;  . CHOLECYSTECTOMY    . COLONOSCOPY    .  ESOPHAGOGASTRODUODENOSCOPY    . THYROIDECTOMY    . TONSILLECTOMY     adenoidectomy  . TRIGGER FINGER RELEASE Right 09/05/2015   Procedure: RELEASE TRIGGER FINGER/A-1 PULLEY;  Surgeon: Kerrin Champagne, MD;  Location: [facility] OR;  Service: Orthopedics;  Laterality: Right;   Social History   Occupational History  . Not on file  Tobacco Use  . Smoking status: Former Smoker    Types: Cigarettes  . Smokeless tobacco: Never Used  Substance and Sexual Activity  . Alcohol use: No  . Drug use: No  . Sexual activity: Yes    Birth control/protection: Post-menopausal

## 2018-06-04 ENCOUNTER — Encounter (INDEPENDENT_AMBULATORY_CARE_PROVIDER_SITE_OTHER): Payer: Self-pay | Admitting: Orthopedic Surgery

## 2018-06-04 ENCOUNTER — Ambulatory Visit (INDEPENDENT_AMBULATORY_CARE_PROVIDER_SITE_OTHER): Payer: BC Managed Care – PPO | Admitting: Orthopedic Surgery

## 2018-06-04 DIAGNOSIS — L03011 Cellulitis of right finger: Secondary | ICD-10-CM

## 2018-06-04 NOTE — Progress Notes (Signed)
Post-Op Visit Note   Patient: Holly [name]           Date of Birth: 05-09-1961           MRN: [id number] Visit Date: 06/04/2018 PCP: Augustin Schooling, MD   Assessment & Plan:  Chief Complaint:  Chief Complaint  Patient presents with  . Right Hand - Follow-up   Visit Diagnoses:  1. Paronychia of finger of right hand     Plan: [name] is a patient who is now about a week out from right middle finger paronychia drainage.  On exam she is doing well.  Feels a lot better.  No drainage.  Motion is good.  No real tenderness.  Plan at this time is to continue with conservative treatment measures.  I will see her back as needed  Follow-Up Instructions: Return if symptoms worsen or fail to improve.   Orders:  No orders of the defined types were placed in this encounter.  No orders of the defined types were placed in this encounter.   Imaging: No results found.  PMFS History: Patient Active Problem List   Diagnosis Date Noted  . DJD (degenerative joint disease), cervical 09/18/2016  . Other fatigue 09/18/2016  . Fibromyalgia 09/18/2016  . Primary osteoarthritis of both knees 09/18/2016  . Primary insomnia 09/18/2016  . Vitamin D deficiency 09/18/2016  . Carpal tunnel syndrome, right 09/05/2015    Class: Chronic  . Trigger finger, acquired 09/05/2015    Class: Chronic  . Pulmonary nodules 01/16/2013  . Dyspnea 01/13/2013   Past Medical History:  Diagnosis Date  . Anxiety    takes Ativan daily  . Back pain    stenosis and buldging disc  . Bone spur    neck and buldging disc  . Depression    takes Cymbalta daily  . Diabetes (HCC)    takes Metformin daily  . Diverticulosis   . GERD (gastroesophageal reflux disease)    takes Prevacid daily  . History of bronchitis    > 5 yrs ago  . History of hiatal hernia   . Hypothyroidism    takes Synthroid daily  . IBS (irritable bowel syndrome)   . Insomnia    doesn't take any meds  . Joint pain   . Lung nodule    right  middle lobe-was being followed by Dr.Burney.Medical Md is keeping up with this  . Migraine   . Migraine    last one 08/28/15  . Nocturia   . Numbness and tingling in hands   . Sleep apnea   . Thoracic outlet syndrome   . Urinary urgency     Family History  Problem Relation Age of Onset  . COPD Mother   . Cancer Mother   . Heart disease Paternal Grandmother   . Heart disease Paternal Grandfather   . Heart disease Maternal Grandmother   . Heart disease Maternal Grandfather     Past Surgical History:  Procedure Laterality Date  . ABDOMINAL EXPLORATION SURGERY     cancer cells in cervix  . CARPAL TUNNEL RELEASE Right 09/05/2015   Procedure: RIGHT OPEN CARPAL TUNNEL RELEASE, RIGHT LONG FINGER TRIGGER RELEASE,RIGHT INDEX FINGER TRIGGER RELEASE;  Surgeon: Kerrin Champagne, MD;  Location: [facility];  Service: Orthopedics;  Laterality: Right;  . CHOLECYSTECTOMY    . COLONOSCOPY    . ESOPHAGOGASTRODUODENOSCOPY    . THYROIDECTOMY    . TONSILLECTOMY     adenoidectomy  . TRIGGER FINGER RELEASE Right 09/05/2015  Procedure: RELEASE TRIGGER FINGER/A-1 PULLEY;  Surgeon: Kerrin Champagne, MD;  Location: [facility];  Service: Orthopedics;  Laterality: Right;   Social History   Occupational History  . Not on file  Tobacco Use  . Smoking status: Former Smoker    Types: Cigarettes  . Smokeless tobacco: Never Used  Substance and Sexual Activity  . Alcohol use: No  . Drug use: No  . Sexual activity: Yes    Birth control/protection: Post-menopausal

## 2018-06-06 DIAGNOSIS — L03011 Cellulitis of right finger: Secondary | ICD-10-CM | POA: Diagnosis not present

## 2018-06-06 DIAGNOSIS — L400 Psoriasis vulgaris: Secondary | ICD-10-CM | POA: Diagnosis not present

## 2018-06-06 DIAGNOSIS — L308 Other specified dermatitis: Secondary | ICD-10-CM | POA: Diagnosis not present

## 2018-06-09 ENCOUNTER — Ambulatory Visit (INDEPENDENT_AMBULATORY_CARE_PROVIDER_SITE_OTHER): Payer: BC Managed Care – PPO | Admitting: Orthopedic Surgery

## 2018-06-09 ENCOUNTER — Encounter (INDEPENDENT_AMBULATORY_CARE_PROVIDER_SITE_OTHER): Payer: Self-pay | Admitting: Orthopedic Surgery

## 2018-06-09 DIAGNOSIS — L03011 Cellulitis of right finger: Secondary | ICD-10-CM

## 2018-06-09 MED ORDER — CLINDAMYCIN HCL 300 MG PO CAPS: ORAL_CAPSULE | ORAL | 0 refills | Status: DC

## 2018-06-09 NOTE — Progress Notes (Signed)
Post-Op Visit Note   Patient: Holly [name]           Date of Birth: [birth year]           MRN: [id number] Visit Date: 06/09/2018 PCP: Augustin Schooling, MD   Assessment & Plan:  Chief Complaint:  Chief Complaint  Patient presents with  . Right Middle Finger - Follow-up   Visit Diagnoses:  1. Paronychia of finger of right hand     Plan: Anni returns with right hand pain on the third finger.  She had a paronychia drained in the office.  This was 1 month of symptoms prior to drainage.  She feels like she is having recurrence of pain.  On exam she does have some redness around that base.  At this time I am to put her back on her oral antibiotics.  Continue with Betadine soaks.  Consider repeat surgery next Monday if on return visit Friday her symptoms have not improved.  This would have to be more extensive procedure involving incision around the hyponychium.  The potential for nail ridging is discussed as well.  Follow-Up Instructions: No follow-ups on file.   Orders:  No orders of the defined types were placed in this encounter.  Meds ordered this encounter  Medications  . clindamycin (CLEOCIN) 300 MG capsule    Sig: 1 po bid x 5 days    Dispense:  10 capsule    Refill:  0    Imaging: No results found.  PMFS History: Patient Active Problem List   Diagnosis Date Noted  . DJD (degenerative joint disease), cervical 09/18/2016  . Other fatigue 09/18/2016  . Fibromyalgia 09/18/2016  . Primary osteoarthritis of both knees 09/18/2016  . Primary insomnia 09/18/2016  . Vitamin D deficiency 09/18/2016  . Carpal tunnel syndrome, right 09/05/2015    Class: Chronic  . Trigger finger, acquired 09/05/2015    Class: Chronic  . Pulmonary nodules 01/16/2013  . Dyspnea 01/13/2013   Past Medical History:  Diagnosis Date  . Anxiety    takes Ativan daily  . Back pain    stenosis and buldging disc  . Bone spur    neck and buldging disc  . Depression    takes Cymbalta daily  .  Diabetes (HCC)    takes Metformin daily  . Diverticulosis   . GERD (gastroesophageal reflux disease)    takes Prevacid daily  . History of bronchitis    > 5 yrs ago  . History of hiatal hernia   . Hypothyroidism    takes Synthroid daily  . IBS (irritable bowel syndrome)   . Insomnia    doesn't take any meds  . Joint pain   . Lung nodule    right middle lobe-was being followed by Dr.Burney.Medical Md is keeping up with this  . Migraine   . Migraine    last one 08/28/15  . Nocturia   . Numbness and tingling in hands   . Sleep apnea   . Thoracic outlet syndrome   . Urinary urgency     Family History  Problem Relation Age of Onset  . COPD Mother   . Cancer Mother   . Heart disease Paternal Grandmother   . Heart disease Paternal Grandfather   . Heart disease Maternal Grandmother   . Heart disease Maternal Grandfather     Past Surgical History:  Procedure Laterality Date  . ABDOMINAL EXPLORATION SURGERY     cancer cells in cervix  . CARPAL TUNNEL  RELEASE Right 09/05/2015   Procedure: RIGHT OPEN CARPAL TUNNEL RELEASE, RIGHT LONG FINGER TRIGGER RELEASE,RIGHT INDEX FINGER TRIGGER RELEASE;  Surgeon: Kerrin Champagne, MD;  Location: [facility];  Service: Orthopedics;  Laterality: Right;  . CHOLECYSTECTOMY    . COLONOSCOPY    . ESOPHAGOGASTRODUODENOSCOPY    . THYROIDECTOMY    . TONSILLECTOMY     adenoidectomy  . TRIGGER FINGER RELEASE Right 09/05/2015   Procedure: RELEASE TRIGGER FINGER/A-1 PULLEY;  Surgeon: Kerrin Champagne, MD;  Location: [facility];  Service: Orthopedics;  Laterality: Right;   Social History   Occupational History  . Not on file  Tobacco Use  . Smoking status: Former Smoker    Types: Cigarettes  . Smokeless tobacco: Never Used  Substance and Sexual Activity  . Alcohol use: No  . Drug use: No  . Sexual activity: Yes    Birth control/protection: Post-menopausal

## 2018-06-13 ENCOUNTER — Ambulatory Visit (INDEPENDENT_AMBULATORY_CARE_PROVIDER_SITE_OTHER): Payer: BC Managed Care – PPO | Admitting: Orthopedic Surgery

## 2018-06-13 ENCOUNTER — Encounter (INDEPENDENT_AMBULATORY_CARE_PROVIDER_SITE_OTHER): Payer: Self-pay | Admitting: Orthopedic Surgery

## 2018-06-13 ENCOUNTER — Telehealth (INDEPENDENT_AMBULATORY_CARE_PROVIDER_SITE_OTHER): Payer: Self-pay

## 2018-06-13 DIAGNOSIS — L03011 Cellulitis of right finger: Secondary | ICD-10-CM

## 2018-06-13 MED ORDER — CLINDAMYCIN HCL 300 MG PO CAPS: ORAL_CAPSULE | ORAL | 0 refills | Status: DC

## 2018-06-13 MED ORDER — MAGIC MOUTHWASH W/LIDOCAINE: 5.0000 mL | Freq: Every day | ORAL | 0 refills | Status: DC

## 2018-06-13 NOTE — Telephone Encounter (Signed)
Patient called stated pharmacy had not received rx for magic mouthwash. I called and spoke with pharmacy and gave verbal to pharmacist.

## 2018-06-13 NOTE — Progress Notes (Signed)
Post-Op Visit Note   Patient: Holly [name]           Date of Birth: 11/22/1960           MRN: [id number] Visit Date: 06/13/2018 PCP: Augustin SchoolingSun, Lisa L, MD   Assessment & Plan:  Chief Complaint:  Chief Complaint  Patient presents with  . Right Hand - Follow-up   Visit Diagnoses:  1. Paronychia of finger of right hand     Plan: Lupita LeashDonna presents for follow-up of finger paronychia.  She wanted to pursue nonoperative treatment.  I put her on some clindamycin.  Still a little redness around the radial base of the fingernail.  No drainage.  We will try antibiotics for 5 more days.  7-day return and we can decide then for or against formal surgical debridement and intervention.  Follow-Up Instructions: No follow-ups on file.   Orders:  No orders of the defined types were placed in this encounter.  Meds ordered this encounter  Medications  . clindamycin (CLEOCIN) 300 MG capsule    Sig: 1 po bid x 5 days    Dispense:  10 capsule    Refill:  0  . DISCONTD: magic mouthwash w/lidocaine SOLN    Sig: Take 5 mLs by mouth daily.    Dispense:  50 mL    Refill:  0  . DISCONTD: magic mouthwash w/lidocaine SOLN    Sig: Take 5 mLs by mouth daily.    Dispense:  50 mL    Refill:  0  . magic mouthwash w/lidocaine SOLN    Sig: Take 5 mLs by mouth daily.    Dispense:  50 mL    Refill:  0    Imaging: No results found.  PMFS History: Patient Active Problem List   Diagnosis Date Noted  . DJD (degenerative joint disease), cervical 09/18/2016  . Other fatigue 09/18/2016  . Fibromyalgia 09/18/2016  . Primary osteoarthritis of both knees 09/18/2016  . Primary insomnia 09/18/2016  . Vitamin D deficiency 09/18/2016  . Carpal tunnel syndrome, right 09/05/2015    Class: Chronic  . Trigger finger, acquired 09/05/2015    Class: Chronic  . Pulmonary nodules 01/16/2013  . Dyspnea 01/13/2013   Past Medical History:  Diagnosis Date  . Anxiety    takes Ativan daily  . Back pain    stenosis  and buldging disc  . Bone spur    neck and buldging disc  . Depression    takes Cymbalta daily  . Diabetes (HCC)    takes Metformin daily  . Diverticulosis   . GERD (gastroesophageal reflux disease)    takes Prevacid daily  . History of bronchitis    > 5 yrs ago  . History of hiatal hernia   . Hypothyroidism    takes Synthroid daily  . IBS (irritable bowel syndrome)   . Insomnia    doesn't take any meds  . Joint pain   . Lung nodule    right middle lobe-was being followed by Dr.Burney.Medical Md is keeping up with this  . Migraine   . Migraine    last one 08/28/15  . Nocturia   . Numbness and tingling in hands   . Sleep apnea   . Thoracic outlet syndrome   . Urinary urgency     Family History  Problem Relation Age of Onset  . COPD Mother   . Cancer Mother   . Heart disease Paternal Grandmother   . Heart disease Paternal Grandfather   .  Heart disease Maternal Grandmother   . Heart disease Maternal Grandfather     Past Surgical History:  Procedure Laterality Date  . ABDOMINAL EXPLORATION SURGERY     cancer cells in cervix  . CARPAL TUNNEL RELEASE Right 09/05/2015   Procedure: RIGHT OPEN CARPAL TUNNEL RELEASE, RIGHT LONG FINGER TRIGGER RELEASE,RIGHT INDEX FINGER TRIGGER RELEASE;  Surgeon: Kerrin Champagne, MD;  Location: [facility] OR;  Service: Orthopedics;  Laterality: Right;  . CHOLECYSTECTOMY    . COLONOSCOPY    . ESOPHAGOGASTRODUODENOSCOPY    . THYROIDECTOMY    . TONSILLECTOMY     adenoidectomy  . TRIGGER FINGER RELEASE Right 09/05/2015   Procedure: RELEASE TRIGGER FINGER/A-1 PULLEY;  Surgeon: Kerrin Champagne, MD;  Location: [facility] OR;  Service: Orthopedics;  Laterality: Right;   Social History   Occupational History  . Not on file  Tobacco Use  . Smoking status: Former Smoker    Types: Cigarettes  . Smokeless tobacco: Never Used  Substance and Sexual Activity  . Alcohol use: No  . Drug use: No  . Sexual activity: Yes    Birth control/protection: Post-menopausal

## 2018-06-23 ENCOUNTER — Ambulatory Visit (INDEPENDENT_AMBULATORY_CARE_PROVIDER_SITE_OTHER): Payer: BC Managed Care – PPO | Admitting: Orthopedic Surgery

## 2018-06-23 DIAGNOSIS — L03011 Cellulitis of right finger: Secondary | ICD-10-CM

## 2018-06-24 ENCOUNTER — Encounter (INDEPENDENT_AMBULATORY_CARE_PROVIDER_SITE_OTHER): Payer: Self-pay | Admitting: Orthopedic Surgery

## 2018-06-24 NOTE — Progress Notes (Signed)
Post-Op Visit Note   Patient: Holly CrankerDonna M Credit           Date of Birth: 06/15/1961           MRN: [id number] Visit Date: 06/23/2018 PCP: Patient, No Pcp Per   Assessment & Plan:  Chief Complaint: No chief complaint on file.  Visit Diagnoses:  1. Paronychia of finger of right hand     Plan: Roe CoombsDon is a patient with finger paronychia which was drained in the office.  Is been doing well.  She is having diminishing symptoms and no pain.  On exam the really is less redness and regrowth of the nail which was excised.  I think that nail will continue to fill in.  If she has further recurrence of redness drainage or pain she should come back in but this looks pretty reasonable at this time.  Follow-up as needed  Follow-Up Instructions: Return if symptoms worsen or fail to improve.   Orders:  No orders of the defined types were placed in this encounter.  No orders of the defined types were placed in this encounter.   Imaging: No results found.  PMFS History: Patient Active Problem List   Diagnosis Date Noted  . DJD (degenerative joint disease), cervical 09/18/2016  . Other fatigue 09/18/2016  . Fibromyalgia 09/18/2016  . Primary osteoarthritis of both knees 09/18/2016  . Primary insomnia 09/18/2016  . Vitamin D deficiency 09/18/2016  . Carpal tunnel syndrome, right 09/05/2015    Class: Chronic  . Trigger finger, acquired 09/05/2015    Class: Chronic  . Pulmonary nodules 01/16/2013  . Dyspnea 01/13/2013   Past Medical History:  Diagnosis Date  . Anxiety    takes Ativan daily  . Back pain    stenosis and buldging disc  . Bone spur    neck and buldging disc  . Depression    takes Cymbalta daily  . Diabetes (HCC)    takes Metformin daily  . Diverticulosis   . GERD (gastroesophageal reflux disease)    takes Prevacid daily  . History of bronchitis    > 5 yrs ago  . History of hiatal hernia   . Hypothyroidism    takes Synthroid daily  . IBS (irritable bowel syndrome)    . Insomnia    doesn't take any meds  . Joint pain   . Lung nodule    right middle lobe-was being followed by Dr.Burney.Medical Md is keeping up with this  . Migraine   . Migraine    last one 08/28/15  . Nocturia   . Numbness and tingling in hands   . Sleep apnea   . Thoracic outlet syndrome   . Urinary urgency     Family History  Problem Relation Age of Onset  . COPD Mother   . Cancer Mother   . Heart disease Paternal Grandmother   . Heart disease Paternal Grandfather   . Heart disease Maternal Grandmother   . Heart disease Maternal Grandfather     Past Surgical History:  Procedure Laterality Date  . ABDOMINAL EXPLORATION SURGERY     cancer cells in cervix  . CARPAL TUNNEL RELEASE Right 09/05/2015   Procedure: RIGHT OPEN CARPAL TUNNEL RELEASE, RIGHT LONG FINGER TRIGGER RELEASE,RIGHT INDEX FINGER TRIGGER RELEASE;  Surgeon: Kerrin ChampagneJames E Nitka, MD;  Location: [facility];  Service: Orthopedics;  Laterality: Right;  . CHOLECYSTECTOMY    . COLONOSCOPY    . ESOPHAGOGASTRODUODENOSCOPY    . THYROIDECTOMY    . TONSILLECTOMY  adenoidectomy  . TRIGGER FINGER RELEASE Right 09/05/2015   Procedure: RELEASE TRIGGER FINGER/A-1 PULLEY;  Surgeon: Kerrin Champagne, MD;  Location: [facility];  Service: Orthopedics;  Laterality: Right;   Social History   Occupational History  . Not on file  Tobacco Use  . Smoking status: Former Smoker    Types: Cigarettes  . Smokeless tobacco: Never Used  Substance and Sexual Activity  . Alcohol use: No  . Drug use: No  . Sexual activity: Yes    Birth control/protection: Post-menopausal

## 2018-06-27 DIAGNOSIS — F329 Major depressive disorder, single episode, unspecified: Secondary | ICD-10-CM | POA: Diagnosis not present

## 2018-06-27 DIAGNOSIS — G43719 Chronic migraine without aura, intractable, without status migrainosus: Secondary | ICD-10-CM | POA: Diagnosis not present

## 2018-06-27 DIAGNOSIS — F419 Anxiety disorder, unspecified: Secondary | ICD-10-CM | POA: Diagnosis not present

## 2018-06-27 DIAGNOSIS — M797 Fibromyalgia: Secondary | ICD-10-CM | POA: Diagnosis not present

## 2018-07-21 ENCOUNTER — Encounter (INDEPENDENT_AMBULATORY_CARE_PROVIDER_SITE_OTHER): Payer: Self-pay | Admitting: Family Medicine

## 2018-07-21 ENCOUNTER — Ambulatory Visit (INDEPENDENT_AMBULATORY_CARE_PROVIDER_SITE_OTHER): Payer: BC Managed Care – PPO | Admitting: Family Medicine

## 2018-07-21 ENCOUNTER — Telehealth (INDEPENDENT_AMBULATORY_CARE_PROVIDER_SITE_OTHER): Payer: Self-pay | Admitting: Specialist

## 2018-07-21 ENCOUNTER — Ambulatory Visit (INDEPENDENT_AMBULATORY_CARE_PROVIDER_SITE_OTHER): Payer: BC Managed Care – PPO

## 2018-07-21 DIAGNOSIS — M5442 Lumbago with sciatica, left side: Secondary | ICD-10-CM | POA: Diagnosis not present

## 2018-07-21 MED ORDER — TIZANIDINE HCL 2 MG PO TABS: 2.0000 mg | ORAL_TABLET | Freq: Four times a day (QID) | ORAL | 1 refills | Status: DC | PRN

## 2018-07-21 MED ORDER — PREDNISONE 10 MG PO TABS: ORAL_TABLET | ORAL | 0 refills | Status: DC

## 2018-07-21 NOTE — Telephone Encounter (Signed)
Patients husband called to let Dr. Otelia Sergeant know that patient is going to the ER to be seen for her back and left leg pain., states she can barely walk. FYI # [phone]

## 2018-07-21 NOTE — Telephone Encounter (Signed)
Patient saw Dr. Prince Rome this morning

## 2018-07-21 NOTE — Progress Notes (Signed)
Office Visit Note   Patient: Holly [name]           Date of Birth: 01/04/1961           MRN: [id number] Visit Date: 07/21/2018 Requested by: No referring provider defined for this encounter. PCP: Patient, No Pcp Per  Subjective: Chief Complaint  Patient presents with  . Lower Back - Pain    Low back pain radiating into left leg for about 6 weeks. Hurts to walk, sit, and stand. Using ice and heat. Taking Advil. Burning feeling in left leg. No Injury.    HPI: She is a 57 year old with low back and left leg pain.  Symptoms started about 6 weeks ago while mowing the lawn.  She started noticing left-sided back pain with severe pain into her leg.  She began seeing Dr. Mardene Celeste for chiropractic treatments but unfortunately her pain does not seem to be improving.  He took some x-rays but she does not know what the x-rays showed and would like for [place] to do some today.  She has been using lots of ibuprofen with only minimal relief of pain.  Denies any bowel or bladder dysfunction.  She is using her husband's walker for support with ambulation.  No previous problems with her low back.              ROS: Otherwise noncontributory  Objective: Vital Signs: There were no vitals taken for this visit.  Physical Exam:  Low back: She has tenderness to the left of midline at the L4-5 level in the paraspinous muscles.  Also tender in the left sciatic notch.  Good range of motion of the hip joint, minimal pain with internal rotation.  Lower extremity strength and reflexes are normal.  Imaging: Lumbar x-rays: Slight degenerative disc disease L4-5 and L5-S1 along with some facet arthropathy.  No sign of compression fracture, otherwise unremarkable.  Assessment & Plan: 1.  Low back and left leg pain, suspicious for lumbar disc herniation.  Not improving with chiropractic treatment. -Prednisone taper, muscle relaxant as needed.  We will proceed with MRI scan followed by epidural injections if  indicated.   Follow-Up Instructions: No follow-ups on file.       Procedures: None today.   PMFS History: Patient Active Problem List   Diagnosis Date Noted  . DJD (degenerative joint disease), cervical 09/18/2016  . Other fatigue 09/18/2016  . Fibromyalgia 09/18/2016  . Primary osteoarthritis of both knees 09/18/2016  . Primary insomnia 09/18/2016  . Vitamin D deficiency 09/18/2016  . Carpal tunnel syndrome, right 09/05/2015    Class: Chronic  . Trigger finger, acquired 09/05/2015    Class: Chronic  . Pulmonary nodules 01/16/2013  . Dyspnea 01/13/2013   Past Medical History:  Diagnosis Date  . Anxiety    takes Ativan daily  . Back pain    stenosis and buldging disc  . Bone spur    neck and buldging disc  . Depression    takes Cymbalta daily  . Diabetes (HCC)    takes Metformin daily  . Diverticulosis   . GERD (gastroesophageal reflux disease)    takes Prevacid daily  . History of bronchitis    > 5 yrs ago  . History of hiatal hernia   . Hypothyroidism    takes Synthroid daily  . IBS (irritable bowel syndrome)   . Insomnia    doesn't take any meds  . Joint pain   . Lung nodule    right middle lobe-was  being followed by Dr.Burney.Medical Md is keeping up with this  . Migraine   . Migraine    last one 08/28/15  . Nocturia   . Numbness and tingling in hands   . Sleep apnea   . Thoracic outlet syndrome   . Urinary urgency     Family History  Problem Relation Age of Onset  . COPD Mother   . Cancer Mother   . Heart disease Paternal Grandmother   . Heart disease Paternal Grandfather   . Heart disease Maternal Grandmother   . Heart disease Maternal Grandfather     Past Surgical History:  Procedure Laterality Date  . ABDOMINAL EXPLORATION SURGERY     cancer cells in cervix  . CARPAL TUNNEL RELEASE Right 09/05/2015   Procedure: RIGHT OPEN CARPAL TUNNEL RELEASE, RIGHT LONG FINGER TRIGGER RELEASE,RIGHT INDEX FINGER TRIGGER RELEASE;  Surgeon: Kerrin Champagne, MD;  Location: [facility];  Service: Orthopedics;  Laterality: Right;  . CHOLECYSTECTOMY    . COLONOSCOPY    . ESOPHAGOGASTRODUODENOSCOPY    . THYROIDECTOMY    . TONSILLECTOMY     adenoidectomy  . TRIGGER FINGER RELEASE Right 09/05/2015   Procedure: RELEASE TRIGGER FINGER/A-1 PULLEY;  Surgeon: Kerrin Champagne, MD;  Location: [facility];  Service: Orthopedics;  Laterality: Right;   Social History   Occupational History  . Not on file  Tobacco Use  . Smoking status: Former Smoker    Types: Cigarettes  . Smokeless tobacco: Never Used  Substance and Sexual Activity  . Alcohol use: No  . Drug use: No  . Sexual activity: Yes    Birth control/protection: Post-menopausal

## 2018-07-23 ENCOUNTER — Ambulatory Visit (INDEPENDENT_AMBULATORY_CARE_PROVIDER_SITE_OTHER): Payer: BC Managed Care – PPO | Admitting: Physician Assistant

## 2018-07-23 ENCOUNTER — Ambulatory Visit
Admission: RE | Admit: 2018-07-23 | Discharge: 2018-07-23 | Disposition: A | Discharge status: Home / Self Care | Payer: BC Managed Care – PPO | Source: Ambulatory Visit | Attending: Family Medicine | Admitting: Family Medicine

## 2018-07-23 DIAGNOSIS — M5442 Lumbago with sciatica, left side: Secondary | ICD-10-CM

## 2018-07-24 ENCOUNTER — Telehealth (INDEPENDENT_AMBULATORY_CARE_PROVIDER_SITE_OTHER): Payer: Self-pay

## 2018-07-24 ENCOUNTER — Ambulatory Visit (INDEPENDENT_AMBULATORY_CARE_PROVIDER_SITE_OTHER): Payer: BC Managed Care – PPO | Admitting: Orthopaedic Surgery

## 2018-07-24 NOTE — Telephone Encounter (Signed)
Patient called concerning her MRI results.  Patient had MRI completed on 07/23/2018.  Patient saw Dr. Prince Rome for her back pain.  Patient would like a call back about MRI results.

## 2018-07-25 ENCOUNTER — Telehealth (INDEPENDENT_AMBULATORY_CARE_PROVIDER_SITE_OTHER): Payer: Self-pay | Admitting: Family Medicine

## 2018-07-25 NOTE — Telephone Encounter (Signed)
Lumbar MRI shows some arthritis, but no ruptured discs or pinched nerves.  No indication for surgery at this point.  Could contemplate referral to physical therapy, or possibly for an epidural steroid injection.

## 2018-07-28 ENCOUNTER — Encounter (INDEPENDENT_AMBULATORY_CARE_PROVIDER_SITE_OTHER): Payer: Self-pay | Admitting: Physician Assistant

## 2018-07-28 ENCOUNTER — Ambulatory Visit (INDEPENDENT_AMBULATORY_CARE_PROVIDER_SITE_OTHER): Payer: BC Managed Care – PPO | Admitting: Physician Assistant

## 2018-07-28 DIAGNOSIS — M5442 Lumbago with sciatica, left side: Secondary | ICD-10-CM | POA: Diagnosis not present

## 2018-07-28 MED ORDER — GABAPENTIN 300 MG PO CAPS: 300.0000 mg | ORAL_CAPSULE | Freq: Every day | ORAL | 1 refills | Status: DC

## 2018-07-28 NOTE — Progress Notes (Signed)
HPI: [name] returns today to go over the MRI of her lumbar spine.  She is seen by Dr. Prince Rome on 07/21/2018 and was given prednisone Dosepak and sent for the MRI.  She states the prednisone Dosepak is helped some.  Her pain is been better relieved by the Zanaflex.  She is having low back pain with radicular symptoms down the left leg into her calf.  States is a sharp burning-like pain.  Pains been ongoing for now 8 weeks.  She notes an increase in urination and some constipation but denies any other bowel bladder dysfunction.  No saddle anesthesia symptoms. MRI of her lumbar spine is reviewed with the patient and shows some mild lumbar spondylosis but no significant disc protrusion or canal stenosis to explain her radicular symptoms.  Moderate bilateral facet arthropathy at L4-5 and mild bilateral facet arthropathy at L3-4.  Physical exam: Good range of motion bilateral hips.  Negative straight leg raise bilaterally.  5 out of 5 strength throughout lower extremities against resistance.  Tenderness over the left paraspinous region of the lower lumbar spine.  Impression: Low back pain with radicular symptoms left leg  Plan: At this point time recommend formal physical therapy for back exercises home exercise program stretching and modalities.  She will continue her Zanaflex we will place her on Neurontin just at night 300 mg.  Have her follow-up with Dr. Ferdie Ping as she is seen him in the past for cervical spine.  Questions were encouraged and answered at length.

## 2018-07-28 NOTE — Telephone Encounter (Signed)
Holly [name] will call patient today, see other message

## 2018-07-28 NOTE — Telephone Encounter (Signed)
This patient has an appointment with you this afternoon at 2:30 to go over these MRI results.

## 2018-07-31 ENCOUNTER — Other Ambulatory Visit (INDEPENDENT_AMBULATORY_CARE_PROVIDER_SITE_OTHER): Payer: Self-pay | Admitting: Orthopaedic Surgery

## 2018-07-31 ENCOUNTER — Telehealth (INDEPENDENT_AMBULATORY_CARE_PROVIDER_SITE_OTHER): Payer: Self-pay | Admitting: Orthopaedic Surgery

## 2018-07-31 MED ORDER — LIDOCAINE 5 % EX PTCH: 1.0000 | MEDICATED_PATCH | CUTANEOUS | 0 refills | Status: DC

## 2018-07-31 MED ORDER — DICLOFENAC SODIUM 1 % TD GEL: 2.0000 g | Freq: Four times a day (QID) | TRANSDERMAL | 3 refills | Status: DC

## 2018-07-31 NOTE — Telephone Encounter (Signed)
See below  I already sent a message to [facility], they want a call from you guys

## 2018-07-31 NOTE — Telephone Encounter (Signed)
Spouse called and left another message in regards to legs hurting and not able to walk.  [phone] or [phone]

## 2018-07-31 NOTE — Telephone Encounter (Signed)
Patient called advised she can not stand up on her left leg. Patient said she had MRI and was told it didn't show anything. Patient asked if Dr Magnus Ivan could call her concerning her left leg. The number to contact patient is [phone]

## 2018-07-31 NOTE — Telephone Encounter (Signed)
See below

## 2018-08-05 ENCOUNTER — Encounter: Payer: Self-pay | Admitting: Neurology

## 2018-08-11 ENCOUNTER — Other Ambulatory Visit (INDEPENDENT_AMBULATORY_CARE_PROVIDER_SITE_OTHER): Payer: Self-pay | Admitting: Family Medicine

## 2018-08-15 ENCOUNTER — Encounter (INDEPENDENT_AMBULATORY_CARE_PROVIDER_SITE_OTHER): Payer: BC Managed Care – PPO | Admitting: Ophthalmology

## 2018-08-18 ENCOUNTER — Ambulatory Visit (INDEPENDENT_AMBULATORY_CARE_PROVIDER_SITE_OTHER): Payer: BC Managed Care – PPO | Admitting: Specialist

## 2018-08-18 ENCOUNTER — Encounter (INDEPENDENT_AMBULATORY_CARE_PROVIDER_SITE_OTHER): Payer: Self-pay | Admitting: Specialist

## 2018-08-18 VITALS — BP 102/63 | HR 84 | Ht 65.0 in | Wt 198.0 lb

## 2018-08-18 DIAGNOSIS — M5417 Radiculopathy, lumbosacral region: Secondary | ICD-10-CM | POA: Diagnosis not present

## 2018-08-18 DIAGNOSIS — M5126 Other intervertebral disc displacement, lumbar region: Secondary | ICD-10-CM | POA: Diagnosis not present

## 2018-08-18 NOTE — Patient Instructions (Signed)
Avoid bending, stooping and avoid lifting weights greater than 10 lbs. Avoid prolong standing and walking. Avoid frequent bending and stooping  No lifting greater than 10 lbs. May use ice or moist heat for pain. Weight loss is of benefit. Handicap license is approved. Dr. Carrollton BlasNewton's secretary/Assistant will call to arrange for left L3-4 transforamenal epidural steroid injection

## 2018-08-18 NOTE — Progress Notes (Signed)
Office Visit Note   Patient: Holly [name]           Date of Birth: 10/12/1960           MRN: [id number] Visit Date: 08/18/2018              Requested by: No referring provider defined for this encounter. PCP: Patient, No Pcp Per   Assessment & Plan: Visit Diagnoses: No diagnosis found.  Plan:Avoid bending, stooping and avoid lifting weights greater than 10 lbs. Avoid prolong standing and walking. Avoid frequent bending and stooping  No lifting greater than 10 lbs. May use ice or moist heat for pain. Weight loss is of benefit. Handicap license is approved. Dr. Beecher BlasNewton's secretary/Assistant will call to arrange for left L3-4 Transforaminal  epidural steroid injection   Follow-Up Instructions: No follow-ups on file.   Orders:  No orders of the defined types were placed in this encounter.  No orders of the defined types were placed in this encounter.     Procedures: No procedures performed   Clinical Data: Findings:  Lumbar MRI with left L3-4 neuroforamenal narrowing and loss of perineural fat right lateral axial image shows a left far lateral HNP left lateral L3-4.  Discussed with Dr. Allena KatzPatel.     Subjective: Chief Complaint  Patient presents with  . Lower Back - Pain  . Left Leg - Pain    57 year old female with about 14 week history of back pain with radiation into the left leg, started with helping her husband mow the yard with a [employer] Self propelled lawn mower. She felt good enough to try to mow the yard. She adjusted the mower to the highest speed but it was not cutting the grass well so she pushed the mower at the lower speed. She relates that the pain came on that night with pain in the lumbosacral junction and into the left PSIS. At first more in the back but she went to the DC and the pain improved and then she started to experience pain into the left knee. No swelling or  Bruising of the left leg. She saw Dr. Prince RomeHilts and she underwent MRI on a Weds and she  returned to see Bronson CurbGil, the report of MRI was negative for nerve compression. Her pain is severe 9-10 at times. She has not slept in about 3 months, but is better lying on the left side and sitting.     Review of Systems  Constitutional: Negative.  Negative for activity change, appetite change, chills, diaphoresis, fatigue, fever and unexpected weight change.  HENT: Negative.  Negative for congestion, dental problem, drooling, ear discharge, ear pain, facial swelling, hearing loss, mouth sores, nosebleeds, postnasal drip, rhinorrhea, sinus pressure, sinus pain, sneezing, sore throat, tinnitus, trouble swallowing and voice change.   Eyes: Negative.  Negative for photophobia, pain, discharge, redness, itching and visual disturbance.  Respiratory: Negative.  Negative for apnea, cough, choking, chest tightness, shortness of breath, wheezing and stridor.   Cardiovascular: Negative.  Negative for chest pain, palpitations and leg swelling.  Gastrointestinal: Negative.  Negative for abdominal distention, abdominal pain, anal bleeding, blood in stool, constipation, diarrhea, nausea, rectal pain and vomiting.  Endocrine: Negative.  Negative for cold intolerance, heat intolerance, polydipsia, polyphagia and polyuria.  Genitourinary: Negative.  Negative for difficulty urinating, dyspareunia, dysuria, enuresis, flank pain, frequency and hematuria.  Musculoskeletal: Positive for back pain and gait problem. Negative for arthralgias, joint swelling, myalgias, neck pain and neck stiffness.  Skin: Negative.  Negative for color change, pallor, rash and wound.  Allergic/Immunologic: Negative.  Negative for environmental allergies, food allergies and immunocompromised state.  Neurological: Positive for weakness and numbness.  Hematological: Negative.  Negative for adenopathy. Does not bruise/bleed easily.  Psychiatric/Behavioral: Negative.  Negative for agitation, behavioral problems, confusion, decreased concentration,  dysphoric mood, hallucinations, self-injury, sleep disturbance and suicidal ideas. The patient is not nervous/anxious and is not hyperactive.      Objective: Vital Signs: BP 102/63 (BP Location: Left Arm, Patient Position: Sitting)   Pulse 84   Ht 5\' 5"  (1.651 m)   Wt 198 lb (89.8 kg)   BMI 32.95 kg/m   Physical Exam  Constitutional: She is oriented to person, place, and time. She appears well-developed and well-nourished.  HENT:  Head: Normocephalic and atraumatic.  Eyes: Pupils are equal, round, and reactive to light. EOM are normal.  Neck: Normal range of motion. Neck supple.  Pulmonary/Chest: Effort normal and breath sounds normal.  Abdominal: Soft. Bowel sounds are normal.  Neurological: She is alert and oriented to person, place, and time.  Skin: Skin is warm and dry.  Psychiatric: She has a normal mood and affect. Her behavior is normal. Judgment and thought content normal.    Back Exam   Tenderness  The patient is experiencing tenderness in the lumbar.  Range of Motion  Extension:  10 abnormal  Flexion: 60  Lateral bend right: normal  Lateral bend left: abnormal  Rotation right: abnormal  Rotation left: abnormal   Muscle Strength  Right Quadriceps:  5/5  Left Quadriceps:  4/5  Right Hamstrings:  5/5  Left Hamstrings:  5/5   Tests  Straight leg raise right: negative Straight leg raise left: positive  Reflexes  Patellar: 0/4 Achilles: 2/4 Babinski's sign: normal   Other  Toe walk: normal Heel walk: normal Sensation: decreased Gait: normal  Erythema: no back redness Scars: absent  Comments:  Positive RSLR right with replication of pain left knee and thigh.      Specialty Comments:  No specialty comments available.  Imaging: No results found.   PMFS History: Patient Active Problem List   Diagnosis Date Noted  . Carpal tunnel syndrome, right 09/05/2015    Priority: High    Class: Chronic  . Trigger finger, acquired 09/05/2015     Priority: High    Class: Chronic  . DJD (degenerative joint disease), cervical 09/18/2016  . Other fatigue 09/18/2016  . Fibromyalgia 09/18/2016  . Primary osteoarthritis of both knees 09/18/2016  . Primary insomnia 09/18/2016  . Vitamin D deficiency 09/18/2016  . Pulmonary nodules 01/16/2013  . Dyspnea 01/13/2013   Past Medical History:  Diagnosis Date  . Anxiety    takes Ativan daily  . Back pain    stenosis and buldging disc  . Bone spur    neck and buldging disc  . Depression    takes Cymbalta daily  . Diabetes (HCC)    takes Metformin daily  . Diverticulosis   . GERD (gastroesophageal reflux disease)    takes Prevacid daily  . History of bronchitis    > 5 yrs ago  . History of hiatal hernia   . Hypothyroidism    takes Synthroid daily  . IBS (irritable bowel syndrome)   . Insomnia    doesn't take any meds  . Joint pain   . Lung nodule    right middle lobe-was being followed by Dr.Burney.Medical Md is keeping up with this  . Migraine   . Migraine  last one 08/28/15  . Nocturia   . Numbness and tingling in hands   . Sleep apnea   . Thoracic outlet syndrome   . Urinary urgency     Family History  Problem Relation Age of Onset  . COPD Mother   . Cancer Mother   . Heart disease Paternal Grandmother   . Heart disease Paternal Grandfather   . Heart disease Maternal Grandmother   . Heart disease Maternal Grandfather     Past Surgical History:  Procedure Laterality Date  . ABDOMINAL EXPLORATION SURGERY     cancer cells in cervix  . CARPAL TUNNEL RELEASE Right 09/05/2015   Procedure: RIGHT OPEN CARPAL TUNNEL RELEASE, RIGHT LONG FINGER TRIGGER RELEASE,RIGHT INDEX FINGER TRIGGER RELEASE;  Surgeon: Kerrin Champagne, MD;  Location: [facility] OR;  Service: Orthopedics;  Laterality: Right;  . CHOLECYSTECTOMY    . COLONOSCOPY    . ESOPHAGOGASTRODUODENOSCOPY    . THYROIDECTOMY    . TONSILLECTOMY     adenoidectomy  . TRIGGER FINGER RELEASE Right 09/05/2015   Procedure:  RELEASE TRIGGER FINGER/A-1 PULLEY;  Surgeon: Kerrin Champagne, MD;  Location: [facility] OR;  Service: Orthopedics;  Laterality: Right;   Social History   Occupational History  . Not on file  Tobacco Use  . Smoking status: Former Smoker    Types: Cigarettes  . Smokeless tobacco: Never Used  Substance and Sexual Activity  . Alcohol use: No  . Drug use: No  . Sexual activity: Yes    Birth control/protection: Post-menopausal

## 2018-08-20 ENCOUNTER — Telehealth (INDEPENDENT_AMBULATORY_CARE_PROVIDER_SITE_OTHER): Payer: Self-pay | Admitting: Specialist

## 2018-08-20 NOTE — Telephone Encounter (Signed)
Patient called would like to speak with Neysa BonitoChristy due to her UTI. Mrs.Mcclarty would like to discuss the medicine she is currently on doctor office found blood in her urine

## 2018-08-21 NOTE — Telephone Encounter (Signed)
I called and spoke with patient she states that she has a UTI and she is on Bactrim DS, she is concerned about her being scheduled for and ESI with Dr. Alvester MorinNewton next if she would still be able to get it done or not.  I spoke with Dr. Alvester MorinNewton and he states that she will be towards the end of her antibiotics that it will be fine to do the injection, however if it one of the first days that we would need to reschedule, but she is good to go for her appt on 08/26/18

## 2018-08-26 ENCOUNTER — Encounter (INDEPENDENT_AMBULATORY_CARE_PROVIDER_SITE_OTHER): Payer: Self-pay | Admitting: Physical Medicine and Rehabilitation

## 2018-08-26 ENCOUNTER — Ambulatory Visit (INDEPENDENT_AMBULATORY_CARE_PROVIDER_SITE_OTHER): Payer: Self-pay

## 2018-08-26 ENCOUNTER — Ambulatory Visit (INDEPENDENT_AMBULATORY_CARE_PROVIDER_SITE_OTHER): Payer: BC Managed Care – PPO | Admitting: Physical Medicine and Rehabilitation

## 2018-08-26 VITALS — BP 155/85 | HR 95 | Temp 98.7°F

## 2018-08-26 DIAGNOSIS — M5416 Radiculopathy, lumbar region: Secondary | ICD-10-CM | POA: Diagnosis not present

## 2018-08-26 DIAGNOSIS — M5116 Intervertebral disc disorders with radiculopathy, lumbar region: Secondary | ICD-10-CM

## 2018-08-26 MED ORDER — BETAMETHASONE SOD PHOS & ACET 6 (3-3) MG/ML IJ SUSP
12.0000 mg | Freq: Once | INTRAMUSCULAR | Status: AC
  Administered 2018-08-26: 12 mg

## 2018-08-26 NOTE — Patient Instructions (Signed)

## 2018-08-26 NOTE — Progress Notes (Signed)
.  Numeric Pain Rating Scale and Functional Assessment Average Pain 7   In the last MONTH (on 0-10 scale) has pain interfered with the following?  1. General activity like being  able to carry out your everyday physical activities such as walking, climbing stairs, carrying groceries, or moving a chair?  Rating(8)   +Driver, -BT, -Dye Allergies.  

## 2018-09-02 ENCOUNTER — Encounter (INDEPENDENT_AMBULATORY_CARE_PROVIDER_SITE_OTHER): Payer: BC Managed Care – PPO | Admitting: Ophthalmology

## 2018-09-02 DIAGNOSIS — E11319 Type 2 diabetes mellitus with unspecified diabetic retinopathy without macular edema: Secondary | ICD-10-CM

## 2018-09-02 DIAGNOSIS — H43813 Vitreous degeneration, bilateral: Secondary | ICD-10-CM | POA: Diagnosis not present

## 2018-09-02 DIAGNOSIS — E113293 Type 2 diabetes mellitus with mild nonproliferative diabetic retinopathy without macular edema, bilateral: Secondary | ICD-10-CM | POA: Diagnosis not present

## 2018-09-16 ENCOUNTER — Telehealth (INDEPENDENT_AMBULATORY_CARE_PROVIDER_SITE_OTHER): Payer: Self-pay | Admitting: Physical Medicine and Rehabilitation

## 2018-09-17 NOTE — Progress Notes (Signed)
Holly [name] Colville - 57 y.o. female MRN [id number]  Date of birth: 11/13/1960  Office Visit Note: Visit Date: 08/26/2018 PCP: Patient, No Pcp Per Referred by: Kerrin ChampagneNitka, James E, MD  Subjective: Chief Complaint  Patient presents with  . Lower Back - Pain  . Left Leg - Pain   HPI:  Holly [name] Dralle is a 57 y.o. female who comes in today For planned left L3 transforaminal injection at the request of Dr. Vira BrownsJames Nitka.  She had been followed by Rexene EdisonGil Clark and MRI obtained of the lumbar spine.  She has had left radicular pain down the leg mainly anterior thigh but also down the leg for over 13 weeks.  Worse with sitting and walking for a long period and nothing really has helped the pain.  She rates her pain as a 7 out of 10 quite severe.  Dr. Otelia SergeantNitka reviewed the MRI that did show lateral disc herniation at L3 and MRI was actually corrected at that point on the official read.  Hopefully she gets some relief from this today she seems to be in a lot of pain.  ROS Otherwise per HPI.  Assessment & Plan: Visit Diagnoses:  1. Radiculopathy due to lumbar intervertebral disc disorder   2. Lumbar radiculopathy     Plan: No additional findings.   Meds & Orders:  Meds ordered this encounter  Medications  . betamethasone acetate-betamethasone sodium phosphate (CELESTONE) injection 12 mg    Orders Placed This Encounter  Procedures  . XR C-ARM NO REPORT  . Epidural Steroid injection    Follow-up: Return if symptoms worsen or fail to improve.   Procedures: No procedures performed  Lumbosacral Transforaminal Epidural Steroid Injection - Sub-Pedicular Approach with Fluoroscopic Guidance  Patient: Holly [name] Germani      Date of Birth: 03/05/1961 MRN: [id number] PCP: Patient, No Pcp Per      Visit Date: 08/26/2018   Universal Protocol:    Date/Time: 08/26/2018  Consent Given By: the patient  Position: PRONE  Additional Comments: Vital signs were monitored before and after the procedure. Patient  was prepped and draped in the usual sterile fashion. The correct patient, procedure, and site was verified.   Injection Procedure Details:  Procedure Site One Meds Administered:  Meds ordered this encounter  Medications  . betamethasone acetate-betamethasone sodium phosphate (CELESTONE) injection 12 mg    Laterality: Left  Location/Site:  L3-L4  Needle size: 22 G  Needle type: Spinal  Needle Placement: Transforaminal  Findings:    -Comments: Excellent flow of contrast along the nerve and into the epidural space.  Procedure Details: After squaring off the end-plates to get a true AP view, the C-arm was positioned so that an oblique view of the foramen as noted above was visualized. The target area is just inferior to the "nose of the scotty dog" or sub pedicular. The soft tissues overlying this structure were infiltrated with 2-3 ml. of 1% Lidocaine without Epinephrine.  The spinal needle was inserted toward the target using a "trajectory" view along the fluoroscope beam.  Under AP and lateral visualization, the needle was advanced so it did not puncture dura and was located close the 6 O'Clock position of the pedical in AP tracterory. Biplanar projections were used to confirm position. Aspiration was confirmed to be negative for CSF and/or blood. A 1-2 ml. volume of Isovue-250 was injected and flow of contrast was noted at each level. Radiographs were obtained for documentation purposes.   After attaining the desired  flow of contrast documented above, a 0.5 to 1.0 ml test dose of 0.25% Marcaine was injected into each respective transforaminal space.  The patient was observed for 90 seconds post injection.  After no sensory deficits were reported, and normal lower extremity motor function was noted,   the above injectate was administered so that equal amounts of the injectate were placed at each foramen (level) into the transforaminal epidural space.   Additional Comments:  The  patient tolerated the procedure well Dressing: Band-Aid    Post-procedure details: Patient was observed during the procedure. Post-procedure instructions were reviewed.  Patient left the clinic in stable condition.    Clinical History: ADDENDUM: MRI lumbar spine was discussed with Dr. Otelia Sergeant on 08/18/2018 at 1418 hours.  Review of the examination was performed secondary to L3 radiculopathy clinically.  At L3-4 there is a small focal left lateral disc extrusion with mass effect on the L3 nerve root.   Electronically Signed By: Elige Ko On: 08/18/2018 14:35  MRI LUMBAR SPINE WITHOUT CONTRAST  TECHNIQUE: Multiplanar, multisequence MR imaging of the lumbar spine was performed. No intravenous contrast was administered.  COMPARISON:  None.  FINDINGS: Segmentation:  Standard.  Alignment:  Physiologic.  Vertebrae:  No fracture, evidence of discitis, or bone lesion.  Conus medullaris and cauda equina: Conus extends to the L1 level. Conus and cauda equina appear normal.  Paraspinal and other soft tissues: Negative.  Disc levels:  Disc spaces: Disc spaces are maintained.  T12-L1: No significant disc bulge. No evidence of neural foraminal stenosis. No central canal stenosis.  L1-L2: No significant disc bulge. No evidence of neural foraminal stenosis. No central canal stenosis.  L2-L3: Minimal broad-based disc bulge. No evidence of neural foraminal stenosis. No central canal stenosis.  L3-L4: Mild broad-based disc bulge. Mild bilateral facet arthropathy. No evidence of neural foraminal stenosis. No central canal stenosis.  L4-L5: Mild broad-based disc bulge. Moderate bilateral facet arthropathy. Bilateral lateral recess narrowing. Mild spinal stenosis. No foraminal stenosis. No evidence of neural foraminal stenosis.  L5-S1: No significant disc bulge. No evidence of neural foraminal stenosis. No central canal stenosis.  IMPRESSION: 1.  Mild lumbar spine spondylosis as described above. No significant disc protrusion or central canal stenosis. 2.  No acute osseous injury of the lumbar spine.  Electronically Signed: By: Elige Ko On: 07/24/2018 10:02     Objective:  VS:  HT:    WT:   BMI:     BP:(!) 155/85  HR:95bpm  TEMP:98.7 F (37.1 C)(Oral)  RESP:  Physical Exam  Ortho Exam Imaging: No results found.

## 2018-09-17 NOTE — Procedures (Signed)
Lumbosacral Transforaminal Epidural Steroid Injection - Sub-Pedicular Approach with Fluoroscopic Guidance  Patient: Holly [name]      Date of Birth: 08/08/1961 MRN: [id number] PCP: Patient, No Pcp Per      Visit Date: 08/26/2018   Universal Protocol:    Date/Time: 08/26/2018  Consent Given By: the patient  Position: PRONE  Additional Comments: Vital signs were monitored before and after the procedure. Patient was prepped and draped in the usual sterile fashion. The correct patient, procedure, and site was verified.   Injection Procedure Details:  Procedure Site One Meds Administered:  Meds ordered this encounter  Medications  . betamethasone acetate-betamethasone sodium phosphate (CELESTONE) injection 12 mg    Laterality: Left  Location/Site:  L3-L4  Needle size: 22 G  Needle type: Spinal  Needle Placement: Transforaminal  Findings:    -Comments: Excellent flow of contrast along the nerve and into the epidural space.  Procedure Details: After squaring off the end-plates to get a true AP view, the C-arm was positioned so that an oblique view of the foramen as noted above was visualized. The target area is just inferior to the "nose of the scotty dog" or sub pedicular. The soft tissues overlying this structure were infiltrated with 2-3 ml. of 1% Lidocaine without Epinephrine.  The spinal needle was inserted toward the target using a "trajectory" view along the fluoroscope beam.  Under AP and lateral visualization, the needle was advanced so it did not puncture dura and was located close the 6 O'Clock position of the pedical in AP tracterory. Biplanar projections were used to confirm position. Aspiration was confirmed to be negative for CSF and/or blood. A 1-2 ml. volume of Isovue-250 was injected and flow of contrast was noted at each level. Radiographs were obtained for documentation purposes.   After attaining the desired flow of contrast documented above, a 0.5 to  1.0 ml test dose of 0.25% Marcaine was injected into each respective transforaminal space.  The patient was observed for 90 seconds post injection.  After no sensory deficits were reported, and normal lower extremity motor function was noted,   the above injectate was administered so that equal amounts of the injectate were placed at each foramen (level) into the transforaminal epidural space.   Additional Comments:  The patient tolerated the procedure well Dressing: Band-Aid    Post-procedure details: Patient was observed during the procedure. Post-procedure instructions were reviewed.  Patient left the clinic in stable condition.

## 2018-09-17 NOTE — Telephone Encounter (Signed)
Scheduled for 10/08/18 at 1030.

## 2018-09-17 NOTE — Telephone Encounter (Signed)
Ok to repeat but [facility] may be quicker

## 2018-09-22 ENCOUNTER — Ambulatory Visit (INDEPENDENT_AMBULATORY_CARE_PROVIDER_SITE_OTHER): Payer: BC Managed Care – PPO | Admitting: Specialist

## 2018-09-22 ENCOUNTER — Encounter (INDEPENDENT_AMBULATORY_CARE_PROVIDER_SITE_OTHER): Payer: Self-pay | Admitting: Specialist

## 2018-09-22 VITALS — BP 120/79 | HR 78 | Ht 65.0 in | Wt 198.0 lb

## 2018-09-22 DIAGNOSIS — M5116 Intervertebral disc disorders with radiculopathy, lumbar region: Secondary | ICD-10-CM

## 2018-09-22 NOTE — Patient Instructions (Signed)
Avoid frequent bending and stooping  No lifting greater than 10 lbs. May use ice or moist heat for pain. Weight loss is of benefit. Best medication for lumbar disc disease is arthritis medications like motrin, celebrex and naprosyn. Exercise is important to improve your indurance and does allow people to function better inspite of back pain.   

## 2018-09-22 NOTE — Progress Notes (Signed)
Office Visit Note   Patient: Holly [name]           Date of Birth: 09/09/1961           MRN: [id number] Visit Date: 09/22/2018              Requested by: No referring provider defined for this encounter. PCP: Patient, No Pcp Per   Assessment & Plan: Visit Diagnoses:  1. Herniation of lumbar intervertebral disc with radiculopathy   Had one ESI with 50% improvement in her sciatica, wishes to proceed with 2nd ESI and it is scheduled for 2nd injection 10/08/2017.  Plan: Avoid frequent bending and stooping  No lifting greater than 10 lbs. May use ice or moist heat for pain. Weight loss is of benefit. Best medication for lumbar disc disease is arthritis medications like motrin, celebrex and naprosyn. Exercise is important to improve your indurance and does allow people to function better inspite of back pain.  Follow-Up Instructions: Return in about 3 weeks (around 10/13/2018).   Orders:  No orders of the defined types were placed in this encounter.  No orders of the defined types were placed in this encounter.     Procedures: No procedures performed   Clinical Data: No additional findings.   Subjective: Chief Complaint  Patient presents with  . Lower Back - Follow-up    She has left L3-4 Tranforminal injection on 08/26/18 with Dr. Alvester MorinNewton. She states that she got 50% relief, but her pain is beginning to return. By the end of the day her back is really bohtering her and the pain is going into her left leg.    57 year old female with history of back pain with left leg pain and lumbar disc protrusion. Underwent ESI by Dr. Alvester MorinNewton with excellent pain relief on 08/26/2018. She is hurting with standing and walking. Pain is some better with sitting and leaning to the left or when lying down on the left with a pillow between the legs. Can lie some on the back with a pillow under the left. The injection relieves about 50% of the pain. She was using a walker previously and it is  improved. Bowel and bladder  Function is normal. She had some pressure and a culture of urine was done.    Review of Systems  Constitutional: Negative.   HENT: Negative.   Eyes: Negative.   Respiratory: Negative.   Cardiovascular: Negative.   Gastrointestinal: Negative.   Endocrine: Negative.   Genitourinary: Negative.   Musculoskeletal: Negative.   Skin: Negative.   Allergic/Immunologic: Negative.   Neurological: Negative.   Hematological: Negative.   Psychiatric/Behavioral: Negative.      Objective: Vital Signs: BP 120/79 (BP Location: Left Arm, Patient Position: Sitting)   Pulse 78   Ht 5\' 5"  (1.651 m)   Wt 198 lb (89.8 kg)   BMI 32.95 kg/m   Physical Exam Constitutional:      Appearance: She is well-developed.  HENT:     Head: Normocephalic and atraumatic.  Eyes:     Pupils: Pupils are equal, round, and reactive to light.  Neck:     Musculoskeletal: Normal range of motion and neck supple.  Pulmonary:     Effort: Pulmonary effort is normal.     Breath sounds: Normal breath sounds.  Abdominal:     General: Bowel sounds are normal.     Palpations: Abdomen is soft.  Skin:    General: Skin is warm and dry.  Neurological:  Mental Status: She is alert and oriented to person, place, and time.  Psychiatric:        Behavior: Behavior normal.        Thought Content: Thought content normal.        Judgment: Judgment normal.     Back Exam   Tenderness  The patient is experiencing tenderness in the lumbar.  Range of Motion  Extension: abnormal  Flexion: normal  Lateral bend right: normal  Lateral bend left: normal  Rotation right: normal  Rotation left: normal   Muscle Strength  The patient has normal back strength. Right Quadriceps:  5/5  Left Quadriceps:  5/5  Right Hamstrings:  5/5   Tests  Straight leg raise right: negative Straight leg raise left: negative  Reflexes  Patellar: 0/4 Achilles: 0/4  Other  Toe walk: normal Heel walk:  normal Sensation: normal Gait: normal  Erythema: no back redness Scars: absent      Specialty Comments:  No specialty comments available.  Imaging: No results found.   PMFS History: Patient Active Problem List   Diagnosis Date Noted  . Carpal tunnel syndrome, right 09/05/2015    Priority: High    Class: Chronic  . Trigger finger, acquired 09/05/2015    Priority: High    Class: Chronic  . DJD (degenerative joint disease), cervical 09/18/2016  . Other fatigue 09/18/2016  . Fibromyalgia 09/18/2016  . Primary osteoarthritis of both knees 09/18/2016  . Primary insomnia 09/18/2016  . Vitamin D deficiency 09/18/2016  . Pulmonary nodules 01/16/2013  . Dyspnea 01/13/2013   Past Medical History:  Diagnosis Date  . Anxiety    takes Ativan daily  . Back pain    stenosis and buldging disc  . Bone spur    neck and buldging disc  . Depression    takes Cymbalta daily  . Diabetes (HCC)    takes Metformin daily  . Diverticulosis   . GERD (gastroesophageal reflux disease)    takes Prevacid daily  . History of bronchitis    > 5 yrs ago  . History of hiatal hernia   . Hypothyroidism    takes Synthroid daily  . IBS (irritable bowel syndrome)   . Insomnia    doesn't take any meds  . Joint pain   . Lung nodule    right middle lobe-was being followed by Dr.Burney.Medical Md is keeping up with this  . Migraine   . Migraine    last one 08/28/15  . Nocturia   . Numbness and tingling in hands   . Sleep apnea   . Thoracic outlet syndrome   . Urinary urgency     Family History  Problem Relation Age of Onset  . COPD Mother   . Cancer Mother   . Heart disease Paternal Grandmother   . Heart disease Paternal Grandfather   . Heart disease Maternal Grandmother   . Heart disease Maternal Grandfather     Past Surgical History:  Procedure Laterality Date  . ABDOMINAL EXPLORATION SURGERY     cancer cells in cervix  . CARPAL TUNNEL RELEASE Right 09/05/2015   Procedure: RIGHT  OPEN CARPAL TUNNEL RELEASE, RIGHT LONG FINGER TRIGGER RELEASE,RIGHT INDEX FINGER TRIGGER RELEASE;  Surgeon: Kerrin ChampagneJames E Mariona Scholes, MD;  Location: [facility] OR;  Service: Orthopedics;  Laterality: Right;  . CHOLECYSTECTOMY    . COLONOSCOPY    . ESOPHAGOGASTRODUODENOSCOPY    . THYROIDECTOMY    . TONSILLECTOMY     adenoidectomy  . TRIGGER FINGER RELEASE Right 09/05/2015  Procedure: RELEASE TRIGGER FINGER/A-1 PULLEY;  Surgeon: Kerrin Champagne, MD;  Location: [facility] OR;  Service: Orthopedics;  Laterality: Right;   Social History   Occupational History  . Not on file  Tobacco Use  . Smoking status: Former Smoker    Types: Cigarettes  . Smokeless tobacco: Never Used  Substance and Sexual Activity  . Alcohol use: No  . Drug use: No  . Sexual activity: Yes    Birth control/protection: Post-menopausal

## 2018-10-03 DIAGNOSIS — F329 Major depressive disorder, single episode, unspecified: Secondary | ICD-10-CM | POA: Diagnosis not present

## 2018-10-03 DIAGNOSIS — F419 Anxiety disorder, unspecified: Secondary | ICD-10-CM | POA: Diagnosis not present

## 2018-10-03 DIAGNOSIS — M797 Fibromyalgia: Secondary | ICD-10-CM | POA: Diagnosis not present

## 2018-10-03 DIAGNOSIS — N390 Urinary tract infection, site not specified: Secondary | ICD-10-CM | POA: Diagnosis not present

## 2018-10-03 DIAGNOSIS — R319 Hematuria, unspecified: Secondary | ICD-10-CM | POA: Diagnosis not present

## 2018-10-03 DIAGNOSIS — G43719 Chronic migraine without aura, intractable, without status migrainosus: Secondary | ICD-10-CM | POA: Diagnosis not present

## 2018-10-08 ENCOUNTER — Encounter (INDEPENDENT_AMBULATORY_CARE_PROVIDER_SITE_OTHER): Payer: BC Managed Care – PPO | Admitting: Physical Medicine and Rehabilitation

## 2018-10-14 DIAGNOSIS — N390 Urinary tract infection, site not specified: Secondary | ICD-10-CM | POA: Diagnosis not present

## 2018-10-14 DIAGNOSIS — E039 Hypothyroidism, unspecified: Secondary | ICD-10-CM | POA: Diagnosis not present

## 2018-10-14 DIAGNOSIS — E11319 Type 2 diabetes mellitus with unspecified diabetic retinopathy without macular edema: Secondary | ICD-10-CM | POA: Diagnosis not present

## 2018-10-14 DIAGNOSIS — R350 Frequency of micturition: Secondary | ICD-10-CM | POA: Diagnosis not present

## 2018-10-20 ENCOUNTER — Ambulatory Visit: Payer: BC Managed Care – PPO | Admitting: Neurology

## 2018-10-20 ENCOUNTER — Encounter

## 2018-10-22 ENCOUNTER — Encounter (INDEPENDENT_AMBULATORY_CARE_PROVIDER_SITE_OTHER): Payer: Self-pay | Admitting: Physical Medicine and Rehabilitation

## 2018-10-23 ENCOUNTER — Ambulatory Visit (INDEPENDENT_AMBULATORY_CARE_PROVIDER_SITE_OTHER): Payer: BC Managed Care – PPO | Admitting: Specialist

## 2018-10-24 DIAGNOSIS — R309 Painful micturition, unspecified: Secondary | ICD-10-CM | POA: Diagnosis not present

## 2018-11-03 DIAGNOSIS — N3000 Acute cystitis without hematuria: Secondary | ICD-10-CM | POA: Diagnosis not present

## 2018-11-05 ENCOUNTER — Ambulatory Visit (INDEPENDENT_AMBULATORY_CARE_PROVIDER_SITE_OTHER): Payer: BC Managed Care – PPO | Admitting: Specialist

## 2018-11-05 ENCOUNTER — Encounter (INDEPENDENT_AMBULATORY_CARE_PROVIDER_SITE_OTHER): Payer: Self-pay | Admitting: Specialist

## 2018-11-05 VITALS — BP 116/69 | HR 77 | Ht 65.0 in | Wt 198.0 lb

## 2018-11-05 DIAGNOSIS — M5126 Other intervertebral disc displacement, lumbar region: Secondary | ICD-10-CM | POA: Diagnosis not present

## 2018-11-05 NOTE — Patient Instructions (Signed)
Plan: Avoid frequent bending and stooping  No lifting greater than 10 lbs. May use ice or moist heat for pain. Weight loss is of benefit. Best medication for lumbar disc disease is arthritis medications like motrin, celebrex and naprosyn. Exercise is important to improve your indurance and does allow people to function better inspite of back pain Call for Neysa Bonito is pain in the leg recurrs and we will schedule further injection if necessary.

## 2018-11-05 NOTE — Progress Notes (Signed)
Office Visit Note   Patient: Holly [name]           Date of Birth: 02/10/1961           MRN: [id number] Visit Date: 11/05/2018              Requested by: No referring provider defined for this encounter. PCP: Patient, No Pcp Per   Assessment & Plan: Visit Diagnoses:  1. Herniation of lumbar intervertebral disc     Plan: Avoid frequent bending and stooping  No lifting greater than 10 lbs. May use ice or moist heat for pain. Weight loss is of benefit. Best medication for lumbar disc disease is arthritis medications like motrin, celebrex and naprosyn. Exercise is important to improve your indurance and does allow people to function better inspite of back pain. Call for Neysa BonitoChristy is pain in the leg recurrs and we will schedule further injection if necessary.   Follow-Up Instructions: No follow-ups on file.   Orders:  No orders of the defined types were placed in this encounter.  No orders of the defined types were placed in this encounter.     Procedures: No procedures performed   Clinical Data: No additional findings.   Subjective: Chief Complaint  Patient presents with  . Lower Back - Follow-up    Doing good.    58 year old female with history of low back pain with radiation into the left hip. Underwent recent left ESI transforamenal L3-4. She reports that the pain in the left leg is better post ESI. Only required one injection with good relief. She relates occasional right back and leg pain in the same area. No bowel or bladder difficullty other than UTI.    Review of Systems  Constitutional: Negative.   HENT: Negative.   Eyes: Negative.   Respiratory: Negative.   Cardiovascular: Negative.   Gastrointestinal: Negative.   Endocrine: Negative.   Genitourinary: Negative.   Musculoskeletal: Negative.   Skin: Negative.   Allergic/Immunologic: Negative.   Neurological: Negative.   Hematological: Negative.   Psychiatric/Behavioral: Negative.       Objective: Vital Signs: BP 116/69 (BP Location: Left Arm, Patient Position: Sitting)   Pulse 77   Ht 5\' 5"  (1.651 m)   Wt 198 lb (89.8 kg)   BMI 32.95 kg/m   Physical Exam Constitutional:      Appearance: She is well-developed.  HENT:     Head: Normocephalic and atraumatic.  Eyes:     Pupils: Pupils are equal, round, and reactive to light.  Neck:     Musculoskeletal: Normal range of motion and neck supple.  Pulmonary:     Effort: Pulmonary effort is normal.     Breath sounds: Normal breath sounds.  Abdominal:     General: Bowel sounds are normal.     Palpations: Abdomen is soft.  Skin:    General: Skin is warm and dry.  Neurological:     Mental Status: She is alert and oriented to person, place, and time.  Psychiatric:        Behavior: Behavior normal.        Thought Content: Thought content normal.        Judgment: Judgment normal.     Back Exam   Tenderness  The patient is experiencing tenderness in the lumbar.  Range of Motion  Extension: normal  Flexion: normal  Lateral bend right: normal  Lateral bend left: normal  Rotation right: normal   Muscle Strength  Right Quadriceps:  5/5  Left Quadriceps:  5/5  Right Hamstrings:  5/5  Left Hamstrings:  5/5   Tests  Straight leg raise right: negative Straight leg raise left: negative  Reflexes  Patellar: normal Achilles: normal Babinski's sign: normal   Other  Toe walk: normal Heel walk: normal Sensation: normal Gait: normal  Erythema: no back redness Scars: absent      Specialty Comments:  No specialty comments available.  Imaging: No results found.   PMFS History: Patient Active Problem List   Diagnosis Date Noted  . Carpal tunnel syndrome, right 09/05/2015    Priority: High    Class: Chronic  . Trigger finger, acquired 09/05/2015    Priority: High    Class: Chronic  . DJD (degenerative joint disease), cervical 09/18/2016  . Other fatigue 09/18/2016  . Fibromyalgia  09/18/2016  . Primary osteoarthritis of both knees 09/18/2016  . Primary insomnia 09/18/2016  . Vitamin D deficiency 09/18/2016  . Pulmonary nodules 01/16/2013  . Dyspnea 01/13/2013   Past Medical History:  Diagnosis Date  . Anxiety    takes Ativan daily  . Back pain    stenosis and buldging disc  . Bone spur    neck and buldging disc  . Depression    takes Cymbalta daily  . Diabetes (HCC)    takes Metformin daily  . Diverticulosis   . GERD (gastroesophageal reflux disease)    takes Prevacid daily  . History of bronchitis    > 5 yrs ago  . History of hiatal hernia   . Hypothyroidism    takes Synthroid daily  . IBS (irritable bowel syndrome)   . Insomnia    doesn't take any meds  . Joint pain   . Lung nodule    right middle lobe-was being followed by Dr.Burney.Medical Md is keeping up with this  . Migraine   . Migraine    last one 08/28/15  . Nocturia   . Numbness and tingling in hands   . Sleep apnea   . Thoracic outlet syndrome   . Urinary urgency     Family History  Problem Relation Age of Onset  . COPD Mother   . Cancer Mother   . Heart disease Paternal Grandmother   . Heart disease Paternal Grandfather   . Heart disease Maternal Grandmother   . Heart disease Maternal Grandfather     Past Surgical History:  Procedure Laterality Date  . ABDOMINAL EXPLORATION SURGERY     cancer cells in cervix  . CARPAL TUNNEL RELEASE Right 09/05/2015   Procedure: RIGHT OPEN CARPAL TUNNEL RELEASE, RIGHT LONG FINGER TRIGGER RELEASE,RIGHT INDEX FINGER TRIGGER RELEASE;  Surgeon: Kerrin Champagne, MD;  Location: [facility] OR;  Service: Orthopedics;  Laterality: Right;  . CHOLECYSTECTOMY    . COLONOSCOPY    . ESOPHAGOGASTRODUODENOSCOPY    . THYROIDECTOMY    . TONSILLECTOMY     adenoidectomy  . TRIGGER FINGER RELEASE Right 09/05/2015   Procedure: RELEASE TRIGGER FINGER/A-1 PULLEY;  Surgeon: Kerrin Champagne, MD;  Location: [facility] OR;  Service: Orthopedics;  Laterality: Right;   Social  History   Occupational History  . Not on file  Tobacco Use  . Smoking status: Former Smoker    Types: Cigarettes  . Smokeless tobacco: Never Used  Substance and Sexual Activity  . Alcohol use: No  . Drug use: No  . Sexual activity: Yes    Birth control/protection: Post-menopausal

## 2018-11-07 ENCOUNTER — Encounter

## 2018-11-07 ENCOUNTER — Encounter (INDEPENDENT_AMBULATORY_CARE_PROVIDER_SITE_OTHER): Payer: BC Managed Care – PPO | Admitting: Physical Medicine and Rehabilitation

## 2018-11-13 DIAGNOSIS — L400 Psoriasis vulgaris: Secondary | ICD-10-CM | POA: Diagnosis not present

## 2018-11-13 DIAGNOSIS — D2261 Melanocytic nevi of right upper limb, including shoulder: Secondary | ICD-10-CM | POA: Diagnosis not present

## 2018-11-13 DIAGNOSIS — L680 Hirsutism: Secondary | ICD-10-CM | POA: Diagnosis not present

## 2018-11-13 DIAGNOSIS — D2262 Melanocytic nevi of left upper limb, including shoulder: Secondary | ICD-10-CM | POA: Diagnosis not present

## 2018-11-21 ENCOUNTER — Telehealth (INDEPENDENT_AMBULATORY_CARE_PROVIDER_SITE_OTHER): Payer: Self-pay | Admitting: Specialist

## 2018-11-21 NOTE — Telephone Encounter (Signed)
Patient called stated having some issues(only wanted to discuss with you) and wants you to call her back.  [phone]

## 2018-11-21 NOTE — Telephone Encounter (Signed)
Holding to speak with you.

## 2018-11-24 NOTE — Telephone Encounter (Signed)
Our phones are down I will call back once they are back up  

## 2018-11-25 NOTE — Telephone Encounter (Signed)
I called and had to lmom, I advised that I was out of the office on Friday and yesterday our phones were down most of the day.  I asked her to call me back and that if I was not available to leave a detailed message with the front desk. And I would try her again later.

## 2018-11-25 NOTE — Telephone Encounter (Signed)
I did try again to call but got her vm--I will try again tomorrow

## 2018-11-27 NOTE — Telephone Encounter (Signed)
Patient came in today with husband and I advised her to sched appt with Dr. Otelia Sergeant and that I would put her on the cancellation list and call her when something opens up.

## 2018-12-01 ENCOUNTER — Encounter (INDEPENDENT_AMBULATORY_CARE_PROVIDER_SITE_OTHER): Payer: Self-pay | Admitting: Specialist

## 2018-12-01 ENCOUNTER — Ambulatory Visit (INDEPENDENT_AMBULATORY_CARE_PROVIDER_SITE_OTHER): Payer: BC Managed Care – PPO | Admitting: Specialist

## 2018-12-01 VITALS — BP 135/77 | HR 63 | Ht 65.0 in | Wt 198.0 lb

## 2018-12-01 DIAGNOSIS — M5030 Other cervical disc degeneration, unspecified cervical region: Secondary | ICD-10-CM | POA: Diagnosis not present

## 2018-12-01 DIAGNOSIS — R0789 Other chest pain: Secondary | ICD-10-CM | POA: Diagnosis not present

## 2018-12-01 DIAGNOSIS — K219 Gastro-esophageal reflux disease without esophagitis: Secondary | ICD-10-CM | POA: Diagnosis not present

## 2018-12-01 DIAGNOSIS — M5020 Other cervical disc displacement, unspecified cervical region: Secondary | ICD-10-CM

## 2018-12-01 NOTE — Progress Notes (Signed)
Office Visit Note   Patient: Holly [name]           Date of Birth: 29-Jun-1961           MRN: [id number] Visit Date: 12/01/2018              Requested by: No referring provider defined for this encounter. PCP: Patient, No Pcp Per   Assessment & Plan: Visit Diagnoses:  1. Degenerative disc disease, cervical   2. Protrusion of cervical intervertebral disc     Plan: Avoid overhead lifting and overhead use of the arms. Do not lift greater than 5 lbs. Adjust head rest in vehicle to prevent hyperextension if rear ended. Take extra precautions to avoid falling. Call Dr.Gates as you are having episodic neck and left arm pain that can be a sign of heart condition or reflux. I believe that the disc at C6-7 is likely the cause but the risk of missing a cardiac condition is concerning enough that you should call Dr. Kevan Ny today to schedule an evaluation.     Follow-Up Instructions: Return in about 4 weeks (around 12/29/2018).   Orders:  No orders of the defined types were placed in this encounter.  No orders of the defined types were placed in this encounter.     Procedures: No procedures performed   Clinical Data: No additional findings.   Subjective: Chief Complaint  Patient presents with  . Left Shoulder - Pain    58 year old female right hand dominant with past history of Left C6-7 HNP 5/20219, responded to [facility] with good relief at that time now with at least 3 episodes of left neck and shoulder pain last episode was one week ago Friday 10 days ago. Thoracic outlet syndrome, blood clots no swelling. No bowel or bladder difficulty, Walking okay, some pain in the right lower back with feeling of tireness. Neck and left shoulder and arm pain are associated with numbness and tingling. No weakness. The pain is severe and when present not severe but akward feeling feeling with tingling with discomfort in the left subclavicular anterior chest wall and radiated down the left arm to  the left wrist.     Review of Systems  Constitutional: Negative.   HENT: Positive for congestion, sinus pressure and sinus pain.   Eyes: Negative.  Negative for photophobia, pain, discharge, redness, itching and visual disturbance.  Respiratory: Negative.  Negative for apnea, cough, choking, chest tightness, shortness of breath, wheezing and stridor.   Cardiovascular: Negative.  Negative for chest pain, palpitations and leg swelling.  Gastrointestinal: Negative.  Negative for abdominal distention, abdominal pain, anal bleeding, blood in stool, constipation and diarrhea.  Endocrine: Negative.  Negative for cold intolerance, heat intolerance, polydipsia and polyphagia.  Genitourinary: Negative.  Negative for difficulty urinating, dyspareunia, dysuria, enuresis, flank pain, frequency, genital sores and hematuria.  Musculoskeletal: Positive for neck pain and neck stiffness.  Skin: Negative.  Negative for color change, pallor, rash and wound.  Allergic/Immunologic: Negative.   Neurological: Positive for numbness and headaches. Negative for weakness.  Hematological: Negative.   Psychiatric/Behavioral: Negative.      Objective: Vital Signs: BP 135/77 (BP Location: Left Arm, Patient Position: Sitting)   Pulse 63   Ht 5\' 5"  (1.651 m)   Wt 198 lb (89.8 kg)   BMI 32.95 kg/m   Physical Exam Constitutional:      Appearance: She is well-developed.  HENT:     Head: Normocephalic and atraumatic.  Eyes:  Pupils: Pupils are equal, round, and reactive to light.  Neck:     Musculoskeletal: Normal range of motion and neck supple.  Pulmonary:     Effort: Pulmonary effort is normal.     Breath sounds: Normal breath sounds.  Abdominal:     General: Bowel sounds are normal.     Palpations: Abdomen is soft.  Skin:    General: Skin is warm and dry.  Neurological:     Mental Status: She is alert and oriented to person, place, and time.  Psychiatric:        Behavior: Behavior normal.         Thought Content: Thought content normal.        Judgment: Judgment normal.     Back Exam   Tenderness  The patient is experiencing tenderness in the cervical.  Range of Motion  Extension: 80  Flexion: 70  Lateral bend right:  80 abnormal  Lateral bend left: 80  Rotation right: 80  Rotation left: 80   Muscle Strength  Right Quadriceps:  5/5  Left Quadriceps:  5/5  Right Hamstrings:  5/5  Left Hamstrings:  5/5   Tests  Straight leg raise right: negative Straight leg raise left: negative  Reflexes  Patellar: 1/4 Achilles: 1/4 Babinski's sign: normal   Other  Toe walk: normal Heel walk: normal Sensation: normal Gait: normal  Erythema: no back redness Scars: absent      Specialty Comments:  No specialty comments available.  Imaging: No results found.   PMFS History: Patient Active Problem List   Diagnosis Date Noted  . Carpal tunnel syndrome, right 09/05/2015    Priority: High    Class: Chronic  . Trigger finger, acquired 09/05/2015    Priority: High    Class: Chronic  . DJD (degenerative joint disease), cervical 09/18/2016  . Other fatigue 09/18/2016  . Fibromyalgia 09/18/2016  . Primary osteoarthritis of both knees 09/18/2016  . Primary insomnia 09/18/2016  . Vitamin D deficiency 09/18/2016  . Pulmonary nodules 01/16/2013  . Dyspnea 01/13/2013   Past Medical History:  Diagnosis Date  . Anxiety    takes Ativan daily  . Back pain    stenosis and buldging disc  . Bone spur    neck and buldging disc  . Depression    takes Cymbalta daily  . Diabetes (HCC)    takes Metformin daily  . Diverticulosis   . GERD (gastroesophageal reflux disease)    takes Prevacid daily  . History of bronchitis    > 5 yrs ago  . History of hiatal hernia   . Hypothyroidism    takes Synthroid daily  . IBS (irritable bowel syndrome)   . Insomnia    doesn't take any meds  . Joint pain   . Lung nodule    right middle lobe-was being followed by  Dr.Burney.Medical Md is keeping up with this  . Migraine   . Migraine    last one 08/28/15  . Nocturia   . Numbness and tingling in hands   . Sleep apnea   . Thoracic outlet syndrome   . Urinary urgency     Family History  Problem Relation Age of Onset  . COPD Mother   . Cancer Mother   . Heart disease Paternal Grandmother   . Heart disease Paternal Grandfather   . Heart disease Maternal Grandmother   . Heart disease Maternal Grandfather     Past Surgical History:  Procedure Laterality Date  . ABDOMINAL EXPLORATION  SURGERY     cancer cells in cervix  . CARPAL TUNNEL RELEASE Right 09/05/2015   Procedure: RIGHT OPEN CARPAL TUNNEL RELEASE, RIGHT LONG FINGER TRIGGER RELEASE,RIGHT INDEX FINGER TRIGGER RELEASE;  Surgeon: Kerrin Champagne, MD;  Location: [facility] OR;  Service: Orthopedics;  Laterality: Right;  . CHOLECYSTECTOMY    . COLONOSCOPY    . ESOPHAGOGASTRODUODENOSCOPY    . THYROIDECTOMY    . TONSILLECTOMY     adenoidectomy  . TRIGGER FINGER RELEASE Right 09/05/2015   Procedure: RELEASE TRIGGER FINGER/A-1 PULLEY;  Surgeon: Kerrin Champagne, MD;  Location: [facility] OR;  Service: Orthopedics;  Laterality: Right;   Social History   Occupational History  . Not on file  Tobacco Use  . Smoking status: Former Smoker    Types: Cigarettes  . Smokeless tobacco: Never Used  Substance and Sexual Activity  . Alcohol use: No  . Drug use: No  . Sexual activity: Yes    Birth control/protection: Post-menopausal

## 2018-12-01 NOTE — Patient Instructions (Signed)
Plan: Avoid overhead lifting and overhead use of the arms. Do not lift greater than 5 lbs. Adjust head rest in vehicle to prevent hyperextension if rear ended. Take extra precautions to avoid falling. Call Dr.Gates as you are having episodic neck and left arm pain that can be a sign of heart condition or reflux. I believe that the disc at C6-7 is likely the cause but the risk of missing a cardiac condition is concerning enough that you should call Dr. Kevan Ny today to schedule an evaluation.

## 2018-12-11 ENCOUNTER — Ambulatory Visit (INDEPENDENT_AMBULATORY_CARE_PROVIDER_SITE_OTHER): Payer: BC Managed Care – PPO | Admitting: Specialist

## 2018-12-26 DIAGNOSIS — F329 Major depressive disorder, single episode, unspecified: Secondary | ICD-10-CM | POA: Diagnosis not present

## 2018-12-26 DIAGNOSIS — F419 Anxiety disorder, unspecified: Secondary | ICD-10-CM | POA: Diagnosis not present

## 2018-12-26 DIAGNOSIS — M797 Fibromyalgia: Secondary | ICD-10-CM | POA: Diagnosis not present

## 2018-12-26 DIAGNOSIS — G43719 Chronic migraine without aura, intractable, without status migrainosus: Secondary | ICD-10-CM | POA: Diagnosis not present

## 2018-12-29 DIAGNOSIS — S61459A Open bite of unspecified hand, initial encounter: Secondary | ICD-10-CM | POA: Diagnosis not present

## 2018-12-29 DIAGNOSIS — S61419A Laceration without foreign body of unspecified hand, initial encounter: Secondary | ICD-10-CM | POA: Diagnosis not present

## 2019-01-01 DIAGNOSIS — G43909 Migraine, unspecified, not intractable, without status migrainosus: Secondary | ICD-10-CM | POA: Diagnosis not present

## 2019-01-01 DIAGNOSIS — E890 Postprocedural hypothyroidism: Secondary | ICD-10-CM | POA: Diagnosis not present

## 2019-01-05 ENCOUNTER — Ambulatory Visit (INDEPENDENT_AMBULATORY_CARE_PROVIDER_SITE_OTHER): Payer: BC Managed Care – PPO | Admitting: Specialist

## 2019-01-12 ENCOUNTER — Telehealth (INDEPENDENT_AMBULATORY_CARE_PROVIDER_SITE_OTHER): Payer: Self-pay | Admitting: Specialist

## 2019-01-12 NOTE — Telephone Encounter (Signed)
Patient came into the clinic with her husband who had an appointment on this date.  She was suppose to go to PT, but didn't because of the Coronavirus.  She is wanting to go back after all the virus is gone and we are back to normal.  Does Dr. Otelia Sergeant need to do another referral?  CB#[phone].  Thank you.

## 2019-01-14 NOTE — Telephone Encounter (Signed)
I called and Lmom that she would need to contact to facility and ask them about this and if they need a new order she call us back and we can get it sent out.

## 2019-03-09 ENCOUNTER — Encounter: Payer: Self-pay | Admitting: Specialist

## 2019-03-09 ENCOUNTER — Ambulatory Visit (INDEPENDENT_AMBULATORY_CARE_PROVIDER_SITE_OTHER): Payer: BC Managed Care – PPO | Admitting: Specialist

## 2019-03-09 ENCOUNTER — Other Ambulatory Visit: Payer: Self-pay

## 2019-03-09 VITALS — BP 118/77 | HR 67 | Ht 65.0 in | Wt 198.0 lb

## 2019-03-09 DIAGNOSIS — M5010 Cervical disc disorder with radiculopathy, unspecified cervical region: Secondary | ICD-10-CM | POA: Diagnosis not present

## 2019-03-09 DIAGNOSIS — M542 Cervicalgia: Secondary | ICD-10-CM

## 2019-03-09 DIAGNOSIS — M5126 Other intervertebral disc displacement, lumbar region: Secondary | ICD-10-CM | POA: Diagnosis not present

## 2019-03-09 NOTE — Progress Notes (Signed)
Office Visit Note   Patient: Holly [name]           Date of Birth: 08/03/1961           MRN: [id number] Visit Date: 03/09/2019              Requested by: No referring provider defined for this encounter. PCP: Patient, No Pcp Per   Assessment & Plan: Visit Diagnoses:  1. Cervicalgia   2. Herniation of cervical intervertebral disc with radiculopathy   3. Lumbar disc herniation     Plan: Avoid frequent bending and stooping  No lifting greater than 10 lbs. May use ice or moist heat for pain. Weight loss is of benefit. Best medication for lumbar disc disease is arthritis medications like motrin, celebrex and naprosyn but you do not wish to take due to history of renal disease.  Exercise is important to improve your indurance and does allow people to function better inspite of back pain. Hemp CBD oil capsules, [employer]  5,000-7,000 mg  One capsule twice.   Avoid overhead lifting and overhead use of the arms. Do not lift greater than 5 lbs. Adjust head rest in vehicle to prevent hyperextension if rear ended. Take extra precautions to avoid falling.  Follow-Up Instructions: Return in about 3 months (around 06/09/2019).   Orders:  No orders of the defined types were placed in this encounter.  No orders of the defined types were placed in this encounter.     Procedures: No procedures performed   Clinical Data: No additional findings.   Subjective: Chief Complaint  Patient presents with  . Neck - Follow-up    58 year old female right hand dominant with ongoing left arm numbness and tingling into the radial 4 digits, less so in the thumb and pinky. With tingling and intermittant left triggering index through the ring fingers. No bowel or bladder difficulty, she is walking fine but has intermittant pain in the right lower back, buttock and down into the right back of the right leg with weakness mainly.  When she sits the pain improves and she is able to get back up and  go. Left leg pain is gone since the ESI in 08/2018. She has not been to the PT due to COVID-19 and reports that the pain and neck discomfort is not bad enough to due.    Review of Systems  Constitutional: Negative for activity change, appetite change, chills, diaphoresis, fatigue, fever and unexpected weight change.  HENT: Positive for congestion, sinus pressure and sneezing. Negative for dental problem, ear discharge, ear pain, facial swelling, hearing loss, mouth sores, nosebleeds, postnasal drip, rhinorrhea, sinus pain, sore throat, tinnitus, trouble swallowing and voice change.   Eyes: Negative.  Negative for photophobia, pain, discharge, redness, itching and visual disturbance.  Respiratory: Negative for apnea, cough, choking, chest tightness, shortness of breath, wheezing and stridor.   Cardiovascular: Negative.  Negative for chest pain, palpitations and leg swelling.  Gastrointestinal: Negative.  Negative for abdominal distention, abdominal pain, anal bleeding, blood in stool, constipation, diarrhea, nausea, rectal pain and vomiting.  Endocrine: Negative.  Negative for cold intolerance, heat intolerance, polydipsia and polyphagia.  Genitourinary: Negative.  Negative for difficulty urinating, dyspareunia, dysuria, enuresis, flank pain, frequency, genital sores, hematuria, menstrual problem, pelvic pain and urgency.  Musculoskeletal: Positive for back pain. Negative for arthralgias, gait problem, joint swelling, myalgias, neck pain and neck stiffness.  Skin: Negative.  Negative for color change, pallor, rash and wound.  Allergic/Immunologic: Negative.  Neurological: Positive for weakness, numbness and headaches. Negative for dizziness, tremors, seizures, syncope, facial asymmetry, speech difficulty and light-headedness.  Hematological: Negative.  Negative for adenopathy. Does not bruise/bleed easily.  Psychiatric/Behavioral: Negative.  Negative for agitation, behavioral problems, confusion,  decreased concentration, dysphoric mood, hallucinations, self-injury, sleep disturbance and suicidal ideas. The patient is not nervous/anxious and is not hyperactive.      Objective: Vital Signs: BP 118/77 (BP Location: Left Arm, Patient Position: Sitting)   Pulse 67   Ht 5\' 5"  (1.651 m)   Wt 198 lb (89.8 kg)   BMI 32.95 kg/m   Physical Exam Constitutional:      Appearance: She is well-developed.  HENT:     Head: Normocephalic and atraumatic.  Eyes:     Pupils: Pupils are equal, round, and reactive to light.  Neck:     Musculoskeletal: Normal range of motion and neck supple.  Pulmonary:     Effort: Pulmonary effort is normal.     Breath sounds: Normal breath sounds.  Abdominal:     General: Bowel sounds are normal.     Palpations: Abdomen is soft.  Skin:    General: Skin is warm and dry.  Neurological:     Mental Status: She is alert and oriented to person, place, and time.  Psychiatric:        Behavior: Behavior normal.        Thought Content: Thought content normal.        Judgment: Judgment normal.     Back Exam   Tenderness  The patient is experiencing tenderness in the cervical.  Range of Motion  Extension: 80  Flexion: 80  Lateral bend right:  80 abnormal  Lateral bend left: 80  Rotation right: 80  Rotation left: 80   Muscle Strength  Right Quadriceps:  5/5  Left Quadriceps:  5/5  Right Hamstrings:  5/5  Left Hamstrings:  5/5   Tests  Straight leg raise right: negative Straight leg raise left: negative  Reflexes  Patellar: 1/4 Achilles: 2/4 Biceps: 0/4 Babinski's sign: normal   Other  Toe walk: normal Heel walk: normal Sensation: normal Gait: normal  Erythema: no back redness Scars: absent  Comments:  Right triceps is 2+ left is trace, but motor is normal in both arms.       Specialty Comments:  No specialty comments available.  Imaging: No results found.   PMFS History: Patient Active Problem List   Diagnosis Date Noted   . Carpal tunnel syndrome, right 09/05/2015    Priority: High    Class: Chronic  . Trigger finger, acquired 09/05/2015    Priority: High    Class: Chronic  . DJD (degenerative joint disease), cervical 09/18/2016  . Other fatigue 09/18/2016  . Fibromyalgia 09/18/2016  . Primary osteoarthritis of both knees 09/18/2016  . Primary insomnia 09/18/2016  . Vitamin D deficiency 09/18/2016  . Pulmonary nodules 01/16/2013  . Dyspnea 01/13/2013   Past Medical History:  Diagnosis Date  . Anxiety    takes Ativan daily  . Back pain    stenosis and buldging disc  . Bone spur    neck and buldging disc  . Depression    takes Cymbalta daily  . Diabetes ([place])    takes Metformin daily  . Diverticulosis   . GERD (gastroesophageal reflux disease)    takes Prevacid daily  . History of bronchitis    > 5 yrs ago  . History of hiatal hernia   . Hypothyroidism  takes Synthroid daily  . IBS (irritable bowel syndrome)   . Insomnia    doesn't take any meds  . Joint pain   . Lung nodule    right middle lobe-was being followed by Dr.Burney.Medical Md is keeping up with this  . Migraine   . Migraine    last one 08/28/15  . Nocturia   . Numbness and tingling in hands   . Sleep apnea   . Thoracic outlet syndrome   . Urinary urgency     Family History  Problem Relation Age of Onset  . COPD Mother   . Cancer Mother   . Heart disease Paternal Grandmother   . Heart disease Paternal Grandfather   . Heart disease Maternal Grandmother   . Heart disease Maternal Grandfather     Past Surgical History:  Procedure Laterality Date  . ABDOMINAL EXPLORATION SURGERY     cancer cells in cervix  . CARPAL TUNNEL RELEASE Right 09/05/2015   Procedure: RIGHT OPEN CARPAL TUNNEL RELEASE, RIGHT LONG FINGER TRIGGER RELEASE,RIGHT INDEX FINGER TRIGGER RELEASE;  Surgeon: Kerrin ChampagneJames E Rahcel Shutes, MD;  Location: [facility] OR;  Service: Orthopedics;  Laterality: Right;  . CHOLECYSTECTOMY    . COLONOSCOPY    .  ESOPHAGOGASTRODUODENOSCOPY    . THYROIDECTOMY    . TONSILLECTOMY     adenoidectomy  . TRIGGER FINGER RELEASE Right 09/05/2015   Procedure: RELEASE TRIGGER FINGER/A-1 PULLEY;  Surgeon: Kerrin ChampagneJames E Tahjanae Blankenburg, MD;  Location: [facility] OR;  Service: Orthopedics;  Laterality: Right;   Social History   Occupational History  . Not on file  Tobacco Use  . Smoking status: Former Smoker    Types: Cigarettes  . Smokeless tobacco: Never Used  Substance and Sexual Activity  . Alcohol use: No  . Drug use: No  . Sexual activity: Yes    Birth control/protection: Post-menopausal

## 2019-03-09 NOTE — Patient Instructions (Signed)
Avoid frequent bending and stooping  No lifting greater than 10 lbs. May use ice or moist heat for pain. Weight loss is of benefit. Best medication for lumbar disc disease is arthritis medications like motrin, celebrex and naprosyn but you do not wish to take due to history of renal disease.  Exercise is important to improve your indurance and does allow people to function better inspite of back pain. Hemp CBD oil capsules, [employer]  5,000-7,000 mg  One capsule twice.   Avoid overhead lifting and overhead use of the arms. Do not lift greater than 5 lbs. Adjust head rest in vehicle to prevent hyperextension if rear ended. Take extra precautions to avoid falling.

## 2019-03-24 DIAGNOSIS — M542 Cervicalgia: Secondary | ICD-10-CM | POA: Diagnosis not present

## 2019-03-24 DIAGNOSIS — G43719 Chronic migraine without aura, intractable, without status migrainosus: Secondary | ICD-10-CM | POA: Diagnosis not present

## 2019-03-24 DIAGNOSIS — M797 Fibromyalgia: Secondary | ICD-10-CM | POA: Diagnosis not present

## 2019-03-24 DIAGNOSIS — M545 Low back pain: Secondary | ICD-10-CM | POA: Diagnosis not present

## 2019-04-02 ENCOUNTER — Telehealth: Payer: Self-pay | Admitting: Specialist

## 2019-04-02 NOTE — Telephone Encounter (Signed)
Pt called in said dr.nitka showed her some supplies that he wanted her to get but she cant find them online and she doesn't have a link for it, so she's requesting a call in regards to that.   [phone]

## 2019-04-02 NOTE — Telephone Encounter (Signed)
I called and and she wanted to know what kind of CBD oil Dr. Louanne Skye recommended for her to take. I advised the note says Hemp CBD oil 5000-7000 mg, take 1 capsule 2 times daily. She said ok.

## 2019-05-22 ENCOUNTER — Encounter: Payer: Self-pay | Admitting: Specialist

## 2019-05-22 ENCOUNTER — Ambulatory Visit (INDEPENDENT_AMBULATORY_CARE_PROVIDER_SITE_OTHER): Payer: BC Managed Care – PPO | Admitting: Specialist

## 2019-05-22 VITALS — BP 134/84 | HR 66 | Ht 65.0 in | Wt 189.0 lb

## 2019-05-22 DIAGNOSIS — M65332 Trigger finger, left middle finger: Secondary | ICD-10-CM | POA: Diagnosis not present

## 2019-05-22 DIAGNOSIS — M4722 Other spondylosis with radiculopathy, cervical region: Secondary | ICD-10-CM | POA: Diagnosis not present

## 2019-05-22 DIAGNOSIS — G5602 Carpal tunnel syndrome, left upper limb: Secondary | ICD-10-CM

## 2019-05-22 DIAGNOSIS — M1812 Unilateral primary osteoarthritis of first carpometacarpal joint, left hand: Secondary | ICD-10-CM | POA: Diagnosis not present

## 2019-05-22 MED ORDER — LIDOCAINE HCL 1 % IJ SOLN
1.0000 mL | INTRAMUSCULAR | Status: AC | PRN
  Administered 2019-05-22: 1 mL

## 2019-05-22 MED ORDER — BUPIVACAINE HCL 0.5 % IJ SOLN
1.0000 mL | INTRAMUSCULAR | Status: AC | PRN
  Administered 2019-05-22: 11:00:00 1 mL

## 2019-05-22 MED ORDER — DICLOFENAC SODIUM 1 % TD GEL: 2.0000 g | Freq: Four times a day (QID) | TRANSDERMAL | 2 refills | Status: DC

## 2019-05-22 MED ORDER — METHYLPREDNISOLONE ACETATE 40 MG/ML IJ SUSP
20.0000 mg | INTRAMUSCULAR | Status: AC | PRN
  Administered 2019-05-22: 20 mg

## 2019-05-22 MED ORDER — IBUPROFEN 800 MG PO TABS: 800.0000 mg | ORAL_TABLET | Freq: Three times a day (TID) | ORAL | 0 refills | Status: DC | PRN

## 2019-05-22 NOTE — Patient Instructions (Addendum)
Carpal Tunnel Syndrome  Carpal tunnel syndrome is a condition that causes pain in your hand and arm. The carpal tunnel is a narrow area located on the palm side of your wrist. Repeated wrist motion or certain diseases may cause swelling within the tunnel. This swelling pinches the main nerve in the wrist (median nerve). What are the causes? This condition may be caused by:  Repeated wrist motions.  Wrist injuries.  Arthritis.  A cyst or tumor in the carpal tunnel.  Fluid buildup during pregnancy. Sometimes the cause of this condition is not known. What increases the risk? This condition is more likely to develop in:  People who have jobs that cause them to repeatedly move their wrists in the same motion, such as Health visitorbutchers and cashiers.  Women.  People with certain conditions, such as: ? Diabetes. ? Obesity. ? An underactive thyroid (hypothyroidism). ? Kidney failure. What are the signs or symptoms? Symptoms of this condition include:  A tingling feeling in your fingers, especially in your thumb, index, and middle fingers.  Tingling or numbness in your hand.  An aching feeling in your entire arm, especially when your wrist and elbow are bent for long periods of time.  Wrist pain that goes up your arm to your shoulder.  Pain that goes down into your palm or fingers.  A weak feeling in your hands. You may have trouble grabbing and holding items. Your symptoms may feel worse during the night. How is this diagnosed? This condition is diagnosed with a medical history and physical exam. You may also have tests, including:  An electromyogram (EMG). This test measures electrical signals sent by your nerves into the muscles.  X-rays. How is this treated? Treatment for this condition includes:  Lifestyle changes. It is important to stop doing or modify the activity that caused your condition.  Physical or occupational therapy.  Medicines for pain and inflammation. This may  include medicine that is injected into your wrist.  A wrist splint.  Surgery. Follow these instructions at home: If you have a splint:   Wear it as told by your health care provider. Remove it only as told by your health care provider.  Loosen the splint if your fingers become numb and tingle, or if they turn cold and blue.  Keep the splint clean and dry. General instructions   Take over-the-counter and prescription medicines only as told by your health care provider.  Rest your wrist from any activity that may be causing your pain. If your condition is work related, talk to your employer about changes that can be made, such as getting a wrist pad to use while typing.  If directed, apply ice to the painful area: ? Put ice in a plastic bag. ? Place a towel between your skin and the bag. ? Leave the ice on for 20 minutes, 2-3 times per day.  Keep all follow-up visits as told by your health care provider. This is important.  Do any exercises as told by your health care provider, physical therapist, or occupational therapist. Contact a health care provider if:  You have new symptoms.  Your pain is not controlled with medicines.  Your symptoms get worse. This information is not intended to replace advice given to you by your health care provider. Make sure you discuss any questions you have with your health care provider. Document Released: 09/14/2000 Document Revised: 01/26/2016 Document Reviewed: 05/29/2017 Elsevier Interactive Patient Education  2017 [employer] Use voltaren gel to increase  antiinflamatory affect in the left hand and left wrist. EMG/NCV of the left hand ordered to assess for CTS vs cervical radiculopathy. Ibuprofen 800mg   Po tid.

## 2019-05-22 NOTE — Progress Notes (Signed)
Office Visit Note   Patient: Holly [name]           Date of Birth: 01/04/1961           MRN: [id number] Visit Date: 05/22/2019              Requested by: No referring provider defined for this encounter. PCP: Patient, No Pcp Per   Assessment & Plan: Visit Diagnoses: No diagnosis found.  Plan:  Carpal tunnel syndrome is a condition that causes pain in your hand and arm. The carpal tunnel is a narrow area located on the palm side of your wrist. Repeated wrist motion or certain diseases may cause swelling within the tunnel. This swelling pinches the main nerve in the wrist (median nerve). What are the causes? This condition may be caused by:  Repeated wrist motions.  Wrist injuries.  Arthritis.  A cyst or tumor in the carpal tunnel.  Fluid buildup during pregnancy. Sometimes the cause of this condition is not known. What increases the risk? This condition is more likely to develop in:  People who have jobs that cause them to repeatedly move their wrists in the same motion, such as Health visitorbutchers and cashiers.  Women.  People with certain conditions, such as: ? Diabetes. ? Obesity. ? An underactive thyroid (hypothyroidism). ? Kidney failure. What are the signs or symptoms? Symptoms of this condition include:  A tingling feeling in your fingers, especially in your thumb, index, and middle fingers.  Tingling or numbness in your hand.  An aching feeling in your entire arm, especially when your wrist and elbow are bent for long periods of time.  Wrist pain that goes up your arm to your shoulder.  Pain that goes down into your palm or fingers.  A weak feeling in your hands. You may have trouble grabbing and holding items. Your symptoms may feel worse during the night. How is this diagnosed? This condition is diagnosed with a medical history and physical exam. You may also have tests, including:  An electromyogram (EMG). This test measures electrical signals sent by  your nerves into the muscles.  X-rays. How is this treated? Treatment for this condition includes:  Lifestyle changes. It is important to stop doing or modify the activity that caused your condition.  Physical or occupational therapy.  Medicines for pain and inflammation. This may include medicine that is injected into your wrist.  A wrist splint.  Surgery. Follow these instructions at home: If you have a splint:   Wear it as told by your health care provider. Remove it only as told by your health care provider.  Loosen the splint if your fingers become numb and tingle, or if they turn cold and blue.  Keep the splint clean and dry. General instructions   Take over-the-counter and prescription medicines only as told by your health care provider.  Rest your wrist from any activity that may be causing your pain. If your condition is work related, talk to your employer about changes that can be made, such as getting a wrist pad to use while typing.  If directed, apply ice to the painful area: ? Put ice in a plastic bag. ? Place a towel between your skin and the bag. ? Leave the ice on for 20 minutes, 2-3 times per day.  Keep all follow-up visits as told by your health care provider. This is important.  Do any exercises as told by your health care provider, physical therapist, or occupational therapist.  Contact a health care provider if:  You have new symptoms.  Your pain is not controlled with medicines.  Your symptoms get worse. This information is not intended to replace advice given to you by your health care provider. Make sure you discuss any questions you have with your health care provider. Document Released: 09/14/2000 Document Revised: 01/26/2016 Document Reviewed: 05/29/2017 Elsevier Interactive Patient Education  2017 [place]. Use voltaren gel to increase antiinflamatory affect in the left hand and left wrist. EMG/NCV of the left hand ordered to assess for  CTS vs cervical radiculopathy. Ibuprofen 800mg   Po tid.  Follow-Up Instructions: Return in about 3 weeks (around 06/12/2019).   Orders:  Orders Placed This Encounter  Procedures   Hand/UE Inj   No orders of the defined types were placed in this encounter.     Procedures: Hand/UE Inj: L long A1 for trigger finger on 05/22/2019 10:38 AM Indications: tendon swelling Details: 25 G needle Medications: 1 mL lidocaine 1 %; 20 mg methylPREDNISolone acetate 40 MG/ML; 1 mL bupivacaine 0.5 % Outcome: tolerated well, no immediate complications  Left long finger A1 pulley initial injection with xylocaine and a insulin needle the 25 G needle with 1:1 xylocaine and marcaine 0.5% with 0.5mg  depomedrol.  Bandaid applied. Patient was prepped and draped in the usual sterile fashion.       Clinical Data: No additional findings.   Subjective: Chief Complaint  Patient presents with   Left Hand - Pain    58 year old female with history of right long finger and right index trigger releases and right CTR in 09/2015.  Preop studies with moderate right CTS, and she failed injection of the right trigger fingers. The preop studies showed left mild CTS finding. Now with complaints of pain left anterior shoulder and pain in the left radial hand and left palmar forearm and left anterior elbow. Symptoms mainly at night left hand. Left long finger with triggering as right did prior to surgery. No neck pain, previous C7-T1 ESI. She does not tolerated oral steroids well. Used to using her left hand more than right, history of previous right thumb near amputation as a child.    Review of Systems  Constitutional: Negative for activity change, appetite change, chills, diaphoresis, fatigue, fever and unexpected weight change.  HENT: Negative.  Negative for congestion, dental problem, drooling, ear discharge, ear pain, facial swelling, hearing loss, mouth sores, nosebleeds, postnasal drip, rhinorrhea, sinus  pressure, sinus pain, sneezing, sore throat, tinnitus, trouble swallowing and voice change.   Eyes: Negative.   Respiratory: Negative.   Cardiovascular: Negative.   Gastrointestinal: Negative.   Endocrine: Negative.   Genitourinary: Negative.   Musculoskeletal: Negative.   Skin: Negative.   Allergic/Immunologic: Negative.   Neurological: Negative.   Psychiatric/Behavioral: Negative.      Objective: Vital Signs: BP 134/84    Pulse 66    Ht 5\' 5"  (1.651 m)    Wt 189 lb (85.7 kg)    BMI 31.45 kg/m   Physical Exam  Left Hand Exam   Tenderness  The patient is experiencing tenderness in the palmar area and radial area.   Range of Motion  Wrist  Extension: 50  Flexion: 90  Pronation: normal  Supination: normal   Muscle Strength  The patient has normal left wrist strength. Wrist extension: 5/5  Wrist flexion: 5/5  Grip:  5/5   Tests  Phalens sign: positive Tinel's sign (median nerve): positive  Other  Erythema: absent Scars: absent Sensation: normal  Pulse: present  Comments:  Triggering of the left long finger.      Specialty Comments:  No specialty comments available.  Imaging: No results found.   PMFS History: Patient Active Problem List   Diagnosis Date Noted   Carpal tunnel syndrome, right 09/05/2015    Priority: High    Class: Chronic   Trigger finger, acquired 09/05/2015    Priority: High    Class: Chronic   DJD (degenerative joint disease), cervical 09/18/2016   Other fatigue 09/18/2016   Fibromyalgia 09/18/2016   Primary osteoarthritis of both knees 09/18/2016   Primary insomnia 09/18/2016   Vitamin D deficiency 09/18/2016   Pulmonary nodules 01/16/2013   Dyspnea 01/13/2013   Past Medical History:  Diagnosis Date   Anxiety    takes Ativan daily   Back pain    stenosis and buldging disc   Bone spur    neck and buldging disc   Depression    takes Cymbalta daily   Diabetes ([facility])    takes Metformin daily    Diverticulosis    GERD (gastroesophageal reflux disease)    takes Prevacid daily   History of bronchitis    > 5 yrs ago   History of hiatal hernia    Hypothyroidism    takes Synthroid daily   IBS (irritable bowel syndrome)    Insomnia    doesn't take any meds   Joint pain    Lung nodule    right middle lobe-was being followed by Dr.Burney.Medical Md is keeping up with this   Migraine    Migraine    last one 08/28/15   Nocturia    Numbness and tingling in hands    Sleep apnea    Thoracic outlet syndrome    Urinary urgency     Family History  Problem Relation Age of Onset   COPD Mother    Cancer Mother    Heart disease Paternal Grandmother    Heart disease Paternal Grandfather    Heart disease Maternal Grandmother    Heart disease Maternal Grandfather     Past Surgical History:  Procedure Laterality Date   ABDOMINAL EXPLORATION SURGERY     cancer cells in cervix   CARPAL TUNNEL RELEASE Right 09/05/2015   Procedure: RIGHT OPEN CARPAL TUNNEL RELEASE, RIGHT LONG FINGER TRIGGER RELEASE,RIGHT INDEX FINGER TRIGGER RELEASE;  Surgeon: Kerrin ChampagneJames E Arath Kaigler, MD;  Location: [facility] OR;  Service: Orthopedics;  Laterality: Right;   CHOLECYSTECTOMY     COLONOSCOPY     ESOPHAGOGASTRODUODENOSCOPY     THYROIDECTOMY     TONSILLECTOMY     adenoidectomy   TRIGGER FINGER RELEASE Right 09/05/2015   Procedure: RELEASE TRIGGER FINGER/A-1 PULLEY;  Surgeon: Kerrin ChampagneJames E Natayah Warmack, MD;  Location: [facility] OR;  Service: Orthopedics;  Laterality: Right;   Social History   Occupational History   Not on file  Tobacco Use   Smoking status: Former Smoker    Types: Cigarettes   Smokeless tobacco: Never Used  Substance and Sexual Activity   Alcohol use: No   Drug use: No   Sexual activity: Yes    Birth control/protection: Post-menopausal

## 2019-06-12 ENCOUNTER — Encounter: Payer: Self-pay | Admitting: Physical Medicine and Rehabilitation

## 2019-06-12 ENCOUNTER — Ambulatory Visit (INDEPENDENT_AMBULATORY_CARE_PROVIDER_SITE_OTHER): Payer: BC Managed Care – PPO | Admitting: Physical Medicine and Rehabilitation

## 2019-06-12 DIAGNOSIS — R202 Paresthesia of skin: Secondary | ICD-10-CM | POA: Diagnosis not present

## 2019-06-12 NOTE — Progress Notes (Signed)
 .  Numeric Pain Rating Scale and Functional Assessment Average Pain 7   In the last MONTH (on 0-10 scale) has pain interfered with the following?  1. General activity like being  able to carry out your everyday physical activities such as walking, climbing stairs, carrying groceries, or moving a chair?  Rating(5)   

## 2019-06-15 NOTE — Progress Notes (Signed)
Holly [name] - 58 y.o. female MRN [id number]  Date of birth: 06/11/1961  Office Visit Note: Visit Date: 06/12/2019 PCP: Patient, No Pcp Per Referred by: Kerrin ChampagneNitka, James E, MD  Subjective: Chief Complaint  Patient presents with  . Left Hand - Numbness   HPI: Holly CrankerDonna M Tolsma is a 58 y.o. female who comes in today At the request of Dr. Vira BrownsJames Nitka for electrodiagnostic study of the left upper limb.  She reports chronic worsening numbness mostly in the radial 3 digits or potentially C6 distribution.  She reports pain in the palm of the hand and some in the wrist.  She has particular pain with trying to open bottles and she has difficulty opening bottles Duda weakness.  She has been wearing a brace at night.  She is somewhat ambidextrous but does write with her right hand.  She has had prior electrodiagnostic studies in 2014 and 2016.  The 2016 electrodiagnostic study showed moderate median neuropathy on the right and mild on the left.  The patient underwent right open carpal tunnel release in 2016 along with trigger finger surgery.  She has had cervical MRI last year showing left paracentral disc protrusion at C6-7.  ROS Otherwise per HPI.  Assessment & Plan: Visit Diagnoses:  1. Paresthesia of skin     Plan: Impression: The above electrodiagnostic study is ABNORMAL and reveals evidence of a mild left median nerve entrapment at the wrist (carpal tunnel syndrome) affecting sensory components. **This represents very little change from the prior electrodiagnostic study a few years ago.  There is no significant electrodiagnostic evidence of any other focal nerve entrapment, brachial plexopathy or cervical radiculopathy.   Recommendations: 1.  Follow-up with referring physician. 2.  Continue current management of symptoms. 3.  Continue use of resting splint at night-time and as needed during the day. 4.  Suggest carpal tunnel injection or consider hydrodissection under ultrasound with Dr.  Lavada MesiMichael Hilts.    Meds & Orders: No orders of the defined types were placed in this encounter.   Orders Placed This Encounter  Procedures  . NCV with EMG (electromyography)    Follow-up: Return for Vira BrownsJames Nitka, M.D..   Procedures: No procedures performed  EMG & NCV Findings: Evaluation of the left median motor nerve showed decreased conduction velocity (Elbow-Wrist, 49 m/s).  The left median (across palm) sensory nerve showed prolonged distal peak latency (Wrist, 3.9 ms).  All remaining nerves (as indicated in the following tables) were within normal limits.    All examined muscles (as indicated in the following table) showed no evidence of electrical instability.    Impression: The above electrodiagnostic study is ABNORMAL and reveals evidence of a mild left median nerve entrapment at the wrist (carpal tunnel syndrome) affecting sensory components. **This represents very little change from the prior electrodiagnostic study a few years ago.  There is no significant electrodiagnostic evidence of any other focal nerve entrapment, brachial plexopathy or cervical radiculopathy.   Recommendations: 1.  Follow-up with referring physician. 2.  Continue current management of symptoms. 3.  Continue use of resting splint at night-time and as needed during the day. 4.  Suggest carpal tunnel injection or consider hydrodissection under ultrasound with Dr. Lavada MesiMichael Hilts.  ___________________________ Naaman PlummerFred Bijan Ridgley FAAPMR Board Certified, American Board of Physical Medicine and Rehabilitation    Nerve Conduction Studies Anti Sensory Summary Table   Stim Site NR Peak (ms) Norm Peak (ms) P-T Amp (V) Norm P-T Amp Site1 Site2 Delta-P (ms) Dist (cm) Vel (m/s)  Norm Vel (m/s)  Left Median Acr Palm Anti Sensory (2nd Digit)  33.1C  Wrist    *3.9 <3.6 16.0 >10 Wrist Palm 2.0 0.0    Palm    1.9 <2.0 21.1         Left Radial Anti Sensory (Base 1st Digit)  32.4C  Wrist    2.0 <3.1 36.2  Wrist Base 1st  Digit 2.0 0.0    Left Ulnar Anti Sensory (5th Digit)  33.1C  Wrist    3.2 <3.7 18.0 >15.0 Wrist 5th Digit 3.2 14.0 44 >38   Motor Summary Table   Stim Site NR Onset (ms) Norm Onset (ms) O-P Amp (mV) Norm O-P Amp Site1 Site2 Delta-0 (ms) Dist (cm) Vel (m/s) Norm Vel (m/s)  Left Median Motor (Abd Poll Brev)  32.7C  Wrist    3.6 <4.2 5.8 >5 Elbow Wrist 4.1 20.0 *49 >50  Elbow    7.7  5.7         Left Ulnar Motor (Abd Dig Min)  32.8C  Wrist    3.2 <4.2 11.6 >3 B Elbow Wrist 3.1 18.5 60 >53  B Elbow    6.3  5.6  A Elbow B Elbow 1.1 9.5 86 >53  A Elbow    7.4  11.9          EMG   Side Muscle Nerve Root Ins Act Fibs Psw Amp Dur Poly Recrt Int Dennie BiblePat Comment  Left Abd Poll Brev Median C8-T1 Nml Nml Nml Nml Nml 0 Nml Nml   Left 1stDorInt Ulnar C8-T1 Nml Nml Nml Nml Nml 0 Nml Nml   Left PronatorTeres Median C6-7 Nml Nml Nml Nml Nml 0 Nml Nml   Left Biceps Musculocut C5-6 Nml Nml Nml Nml Nml 0 Nml Nml   Left Deltoid Axillary C5-6 Nml Nml Nml Nml Nml 0 Nml Nml     Nerve Conduction Studies Anti Sensory Left/Right Comparison   Stim Site L Lat (ms) R Lat (ms) L-R Lat (ms) L Amp (V) R Amp (V) L-R Amp (%) Site1 Site2 L Vel (m/s) R Vel (m/s) L-R Vel (m/s)  Median Acr Palm Anti Sensory (2nd Digit)  33.1C  Wrist *3.9   16.0   Wrist Palm     Palm 1.9   21.1         Radial Anti Sensory (Base 1st Digit)  32.4C  Wrist 2.0   36.2   Wrist Base 1st Digit     Ulnar Anti Sensory (5th Digit)  33.1C  Wrist 3.2   18.0   Wrist 5th Digit 44     Motor Left/Right Comparison   Stim Site L Lat (ms) R Lat (ms) L-R Lat (ms) L Amp (mV) R Amp (mV) L-R Amp (%) Site1 Site2 L Vel (m/s) R Vel (m/s) L-R Vel (m/s)  Median Motor (Abd Poll Brev)  32.7C  Wrist 3.6   5.8   Elbow Wrist *49    Elbow 7.7   5.7         Ulnar Motor (Abd Dig Min)  32.8C  Wrist 3.2   11.6   B Elbow Wrist 60    B Elbow 6.3   5.6   A Elbow B Elbow 86    A Elbow 7.4   11.9            Waveforms:            Clinical History:  08/04/2015 EMG/NCS Impression: The above electrodiagnostic study is ABNORMAL and reveals evidence of:  1. A moderate right median nerve entrapment at the wrist (carpal tunnel syndrome) affecting sensory and motor components.  2. A mild left median nerve entrapment at the wrist (carpal tunnel syndrome) affecting sensory components.   There is no significant electrodiagnostic evidence of any other focal nerve entrapment, brachial plexopathy or cervical radiculopathy.  ------- 08/03/2013 EMG/NCS Impression: The above electrodiagnostic study is ABNORMAL and reveals evidence suggestive of mild chronic C5 radiculopathy on the left.    There is no significant electrodiagnostic evidence of any other focal nerve entrapment, brachial plexopathy or generalized peripheral neuropathy.   *Clinically, the differential diagnosis should include radiculitis/radiculopathy, median nerve neuritis, myofascial pain and nonspecific neurogenic thoracic outlet syndrome.   She reports that she has quit smoking. Her smoking use included cigarettes. She has never used smokeless tobacco. No results for input(s): HGBA1C, LABURIC in the last 8760 hours.  Objective:  VS:  HT:    WT:   BMI:     BP:   HR: bpm  TEMP: ( )  RESP:  Physical Exam Musculoskeletal:        General: No swelling, tenderness or deformity.     Comments: Inspection reveals no atrophy of the bilateral APB or FDI or hand intrinsics. There is no swelling, color changes, allodynia or dystrophic changes. There is 5 out of 5 strength in the bilateral wrist extension, finger abduction and long finger flexion. There is intact sensation to light touch in all dermatomal and peripheral nerve distributions. There is a negative Hoffmann's test bilaterally.  Skin:    General: Skin is warm and dry.     Findings: No erythema or rash.  Neurological:     General: No focal deficit present.     Mental Status: She is alert and oriented to person, place, and time.      Motor: No weakness or abnormal muscle tone.     Coordination: Coordination normal.  Psychiatric:        Mood and Affect: Mood normal.        Behavior: Behavior normal.     Ortho Exam Imaging: No results found.  Past Medical/Family/Surgical/Social History: Medications & Allergies reviewed per EMR, new medications updated. Patient Active Problem List   Diagnosis Date Noted  . DJD (degenerative joint disease), cervical 09/18/2016  . Other fatigue 09/18/2016  . Fibromyalgia 09/18/2016  . Primary osteoarthritis of both knees 09/18/2016  . Primary insomnia 09/18/2016  . Vitamin D deficiency 09/18/2016  . Carpal tunnel syndrome, right 09/05/2015    Class: Chronic  . Trigger finger, acquired 09/05/2015    Class: Chronic  . Pulmonary nodules 01/16/2013  . Dyspnea 01/13/2013   Past Medical History:  Diagnosis Date  . Anxiety    takes Ativan daily  . Back pain    stenosis and buldging disc  . Bone spur    neck and buldging disc  . Depression    takes Cymbalta daily  . Diabetes ([place])    takes Metformin daily  . Diverticulosis   . GERD (gastroesophageal reflux disease)    takes Prevacid daily  . History of bronchitis    > 5 yrs ago  . History of hiatal hernia   . Hypothyroidism    takes Synthroid daily  . IBS (irritable bowel syndrome)   . Insomnia    doesn't take any meds  . Joint pain   . Lung nodule    right middle lobe-was being followed by Dr.Burney.Medical Md is keeping up with this  . Migraine   .  Migraine    last one 08/28/15  . Nocturia   . Numbness and tingling in hands   . Sleep apnea   . Thoracic outlet syndrome   . Urinary urgency    Family History  Problem Relation Age of Onset  . COPD Mother   . Cancer Mother   . Heart disease Paternal Grandmother   . Heart disease Paternal Grandfather   . Heart disease Maternal Grandmother   . Heart disease Maternal Grandfather    Past Surgical History:  Procedure Laterality Date  . ABDOMINAL EXPLORATION  SURGERY     cancer cells in cervix  . CARPAL TUNNEL RELEASE Right 09/05/2015   Procedure: RIGHT OPEN CARPAL TUNNEL RELEASE, RIGHT LONG FINGER TRIGGER RELEASE,RIGHT INDEX FINGER TRIGGER RELEASE;  Surgeon: Kerrin Champagne, MD;  Location: [facility] OR;  Service: Orthopedics;  Laterality: Right;  . CHOLECYSTECTOMY    . COLONOSCOPY    . ESOPHAGOGASTRODUODENOSCOPY    . THYROIDECTOMY    . TONSILLECTOMY     adenoidectomy  . TRIGGER FINGER RELEASE Right 09/05/2015   Procedure: RELEASE TRIGGER FINGER/A-1 PULLEY;  Surgeon: Kerrin Champagne, MD;  Location: [facility] OR;  Service: Orthopedics;  Laterality: Right;   Social History   Occupational History  . Not on file  Tobacco Use  . Smoking status: Former Smoker    Types: Cigarettes  . Smokeless tobacco: Never Used  Substance and Sexual Activity  . Alcohol use: No  . Drug use: No  . Sexual activity: Yes    Birth control/protection: Post-menopausal

## 2019-06-15 NOTE — Procedures (Signed)
EMG & NCV Findings: Evaluation of the left median motor nerve showed decreased conduction velocity (Elbow-Wrist, 49 m/s).  The left median (across palm) sensory nerve showed prolonged distal peak latency (Wrist, 3.9 ms).  All remaining nerves (as indicated in the following tables) were within normal limits.    All examined muscles (as indicated in the following table) showed no evidence of electrical instability.    Impression: The above electrodiagnostic study is ABNORMAL and reveals evidence of a mild left median nerve entrapment at the wrist (carpal tunnel syndrome) affecting sensory components. **This represents very little change from the prior electrodiagnostic study a few years ago.  There is no significant electrodiagnostic evidence of any other focal nerve entrapment, brachial plexopathy or cervical radiculopathy.   Recommendations: 1.  Follow-up with referring physician. 2.  Continue current management of symptoms. 3.  Continue use of resting splint at night-time and as needed during the day. 4.  Suggest carpal tunnel injection or consider hydrodissection under ultrasound with Dr. Eunice Blase.  ___________________________ Laurence Spates FAAPMR Board Certified, American Board of Physical Medicine and Rehabilitation    Nerve Conduction Studies Anti Sensory Summary Table   Stim Site NR Peak (ms) Norm Peak (ms) P-T Amp (V) Norm P-T Amp Site1 Site2 Delta-P (ms) Dist (cm) Vel (m/s) Norm Vel (m/s)  Left Median Acr Palm Anti Sensory (2nd Digit)  33.1C  Wrist    *3.9 <3.6 16.0 >10 Wrist Palm 2.0 0.0    Palm    1.9 <2.0 21.1         Left Radial Anti Sensory (Base 1st Digit)  32.4C  Wrist    2.0 <3.1 36.2  Wrist Base 1st Digit 2.0 0.0    Left Ulnar Anti Sensory (5th Digit)  33.1C  Wrist    3.2 <3.7 18.0 >15.0 Wrist 5th Digit 3.2 14.0 44 >38   Motor Summary Table   Stim Site NR Onset (ms) Norm Onset (ms) O-P Amp (mV) Norm O-P Amp Site1 Site2 Delta-0 (ms) Dist (cm) Vel (m/s) Norm  Vel (m/s)  Left Median Motor (Abd Poll Brev)  32.7C  Wrist    3.6 <4.2 5.8 >5 Elbow Wrist 4.1 20.0 *49 >50  Elbow    7.7  5.7         Left Ulnar Motor (Abd Dig Min)  32.8C  Wrist    3.2 <4.2 11.6 >3 B Elbow Wrist 3.1 18.5 60 >53  B Elbow    6.3  5.6  A Elbow B Elbow 1.1 9.5 86 >53  A Elbow    7.4  11.9          EMG   Side Muscle Nerve Root Ins Act Fibs Psw Amp Dur Poly Recrt Int Fraser Din Comment  Left Abd Poll Brev Median C8-T1 Nml Nml Nml Nml Nml 0 Nml Nml   Left 1stDorInt Ulnar C8-T1 Nml Nml Nml Nml Nml 0 Nml Nml   Left PronatorTeres Median C6-7 Nml Nml Nml Nml Nml 0 Nml Nml   Left Biceps Musculocut C5-6 Nml Nml Nml Nml Nml 0 Nml Nml   Left Deltoid Axillary C5-6 Nml Nml Nml Nml Nml 0 Nml Nml     Nerve Conduction Studies Anti Sensory Left/Right Comparison   Stim Site L Lat (ms) R Lat (ms) L-R Lat (ms) L Amp (V) R Amp (V) L-R Amp (%) Site1 Site2 L Vel (m/s) R Vel (m/s) L-R Vel (m/s)  Median Acr Palm Anti Sensory (2nd Digit)  33.1C  Wrist *3.9   16.0  Wrist Palm     Palm 1.9   21.1         Radial Anti Sensory (Base 1st Digit)  32.4C  Wrist 2.0   36.2   Wrist Base 1st Digit     Ulnar Anti Sensory (5th Digit)  33.1C  Wrist 3.2   18.0   Wrist 5th Digit 44     Motor Left/Right Comparison   Stim Site L Lat (ms) R Lat (ms) L-R Lat (ms) L Amp (mV) R Amp (mV) L-R Amp (%) Site1 Site2 L Vel (m/s) R Vel (m/s) L-R Vel (m/s)  Median Motor (Abd Poll Brev)  32.7C  Wrist 3.6   5.8   Elbow Wrist *49    Elbow 7.7   5.7         Ulnar Motor (Abd Dig Min)  32.8C  Wrist 3.2   11.6   B Elbow Wrist 60    B Elbow 6.3   5.6   A Elbow B Elbow 86    A Elbow 7.4   11.9            Waveforms:

## 2019-06-18 ENCOUNTER — Ambulatory Visit (INDEPENDENT_AMBULATORY_CARE_PROVIDER_SITE_OTHER): Payer: BC Managed Care – PPO | Admitting: Specialist

## 2019-06-18 ENCOUNTER — Encounter: Payer: Self-pay | Admitting: Specialist

## 2019-06-18 ENCOUNTER — Other Ambulatory Visit: Payer: Self-pay

## 2019-06-18 VITALS — BP 130/74 | HR 58 | Ht 65.0 in | Wt 189.0 lb

## 2019-06-18 DIAGNOSIS — M47812 Spondylosis without myelopathy or radiculopathy, cervical region: Secondary | ICD-10-CM

## 2019-06-18 DIAGNOSIS — G5602 Carpal tunnel syndrome, left upper limb: Secondary | ICD-10-CM | POA: Diagnosis not present

## 2019-06-18 DIAGNOSIS — M65332 Trigger finger, left middle finger: Secondary | ICD-10-CM

## 2019-06-18 NOTE — Progress Notes (Signed)
Office Visit Note   Patient: Holly [name]           Date of Birth: 1960/12/05           MRN: [id number] Visit Date: 06/18/2019              Requested by: No referring provider defined for this encounter. PCP: Patient, No Pcp Per   Assessment & Plan: Visit Diagnoses:  1. Carpal tunnel syndrome, left upper limb   2. Trigger middle finger of left hand     Plan: Carpal Tunnel Syndrome  Carpal tunnel syndrome is a condition that causes pain in your hand and arm. The carpal tunnel is a narrow area located on the palm side of your wrist. Repeated wrist motion or certain diseases may cause swelling within the tunnel. This swelling pinches the main nerve in the wrist (median nerve). What are the causes? This condition may be caused by:  Repeated wrist motions.  Wrist injuries.  Arthritis.  A cyst or tumor in the carpal tunnel.  Fluid buildup during pregnancy. Sometimes the cause of this condition is not known. What increases the risk? This condition is more likely to develop in:  People who have jobs that cause them to repeatedly move their wrists in the same motion, such as Art gallery manager.  Women.  People with certain conditions, such as: ? Diabetes. ? Obesity. ? An underactive thyroid (hypothyroidism). ? Kidney failure. What are the signs or symptoms? Symptoms of this condition include:  A tingling feeling in your fingers, especially in your thumb, index, and middle fingers.  Tingling or numbness in your hand.  An aching feeling in your entire arm, especially when your wrist and elbow are bent for long periods of time.  Wrist pain that goes up your arm to your shoulder.  Pain that goes down into your palm or fingers.  A weak feeling in your hands. You may have trouble grabbing and holding items. Your symptoms may feel worse during the night. How is this diagnosed? This condition is diagnosed with a medical history and physical exam. You may also have  tests, including:  An electromyogram (EMG). This test measures electrical signals sent by your nerves into the muscles.  X-rays. How is this treated? Treatment for this condition includes:  Lifestyle changes. It is important to stop doing or modify the activity that caused your condition.  Physical or occupational therapy.  Medicines for pain and inflammation. This may include medicine that is injected into your wrist.  A wrist splint.  Surgery. Follow these instructions at home: If you have a splint:   Wear it as told by your health care provider. Remove it only as told by your health care provider.  Loosen the splint if your fingers become numb and tingle, or if they turn cold and blue.  Keep the splint clean and dry. General instructions   Take over-the-counter and prescription medicines only as told by your health care provider.  Rest your wrist from any activity that may be causing your pain. If your condition is work related, talk to your employer about changes that can be made, such as getting a wrist pad to use while typing.  If directed, apply ice to the painful area: ? Put ice in a plastic bag. ? Place a towel between your skin and the bag. ? Leave the ice on for 20 minutes, 2-3 times per day.  Keep all follow-up visits as told by your health care  provider. This is important.  Do any exercises as told by your health care provider, physical therapist, or occupational therapist. Contact a health care provider if:  You have new symptoms.  Your pain is not controlled with medicines.  Your symptoms get worse. This information is not intended to replace advice given to you by your health care provider. Make sure you discuss any questions you have with your health care provider. Document Released: 09/14/2000 Document Revised: 01/26/2016 Document Reviewed: 05/29/2017 Elsevier Interactive Patient Education  2017 [employer] May use the wrist splints as needed.   Our office will contact you to schedule for a left open carpal tunnel release. Tivis Ringer is the surgery scheduler and she will get approval from your insurance and call you. Risk of surgery includes risk infection 1:200, bleeding is not usual, 1 in 1,000,000 risk of blood loss that is significant. Risk to the nerve is 1:30,000. [employer] hemp CBD oil capsules 60 capsules per bottle 5,000mg -7,000mg  Per bottle.   Follow-Up Instructions: No follow-ups on file.   Orders:  No orders of the defined types were placed in this encounter.  No orders of the defined types were placed in this encounter.     Procedures: No procedures performed   Clinical Data: Findings:   Holly [name] Usman - 58 y.o. female MRN [id number]  Date of birth: 03/26/1961  Office Visit Note: Visit Date: 06/12/2019 PCP: Patient, No Pcp Per Referred by: Kerrin Champagne, MD  Subjective:  Chief Complaint Patient presents with . Left Hand - Numbness  HPI: Holly [name] is a 58 y.o. female who comes in today At the request of Dr. Vira Browns for electrodiagnostic study of the left upper limb.  She reports chronic worsening numbness mostly in the radial 3 digits or potentially C6 distribution.  She reports pain in the palm of the hand and some in the wrist.  She has particular pain with trying to open bottles and she has difficulty opening bottles Duda weakness.  She has been wearing a brace at night.  She is somewhat ambidextrous but does write with her right hand.  She has had prior electrodiagnostic studies in 2014 and 2016.  The 2016 electrodiagnostic study showed moderate median neuropathy on the right and mild on the left.  The patient underwent right open carpal tunnel release in 2016 along with trigger finger surgery.  She has had cervical MRI last year showing left paracentral disc protrusion at C6-7.  ROS Otherwise per HPI.  Assessment & Plan: Visit Diagnoses:   1. Paresthesia of skin    Plan:  Impression: The above electrodiagnostic study is ABNORMAL and reveals evidence of a mild left median nerve entrapment at the wrist (carpal tunnel syndrome) affecting sensory components. **This represents very little change from the prior electrodiagnostic study a few years ago.  There is no significant electrodiagnostic evidence of any other focal nerve entrapment, brachial plexopathy or cervical radiculopathy.   Recommendations: 1.  Follow-up with referring physician. 2.  Continue current management of symptoms. 3.  Continue use of resting splint at night-time and as needed during the day. 4.  Suggest carpal tunnel injection or consider hydrodissection under ultrasound with Dr. Lavada Mesi.    Meds & Orders: No orders of the defined types were placed in this encounter.    Orders Placed This Encounter Procedures . NCV with EMG (electromyography)   Follow-up: Return for Vira Browns, M.D..   Procedures: No procedures performed  EMG & NCV Findings: Evaluation of the  left median motor nerve showed decreased conduction velocity (Elbow-Wrist, 49 m/s).  The left median (across palm) sensory nerve showed prolonged distal peak latency (Wrist, 3.9 ms).  All remaining nerves (as indicated in the following tables) were within normal limits.    All examined muscles (as indicated in the following table) showed no evidence of electrical instability.    Impression: The above electrodiagnostic study is ABNORMAL and reveals evidence of a mild left median nerve entrapment at the wrist (carpal tunnel syndrome) affecting sensory components. **This represents very little change from the prior electrodiagnostic study a few years ago.  There is no significant electrodiagnostic evidence of any other focal nerve entrapment, brachial plexopathy or cervical radiculopathy.   Recommendations: 1.  Follow-up with referring physician. 2.  Continue current management of symptoms. 3.  Continue use of  resting splint at night-time and as needed during the day. 4.  Suggest carpal tunnel injection or consider hydrodissection under ultrasound with Dr. Lavada Mesi.  ___________________________ Naaman Plummer FAAPMR Board Certified, American Board of Physical Medicine and Rehabilitation   Nerve Conduction Studies Anti Sensory Summary Table    Stim Site NR Peak (ms) Norm Peak (ms) P-T Amp (V) Norm P-T Amp Site1 Site2 Delta-P (ms) Dist (cm) Vel (m/s) Norm Vel (m/s) Left Median Acr Palm Anti Sensory (2nd Digit)  33.1C Wrist    *3.9 <3.6 16.0 >10 Wrist Palm 2.0 0.0   Palm    1.9 <2.0 21.1        Left Radial Anti Sensory (Base 1st Digit)  32.4C Wrist    2.0 <3.1 36.2  Wrist Base 1st Digit 2.0 0.0   Left Ulnar Anti Sensory (5th Digit)  33.1C Wrist    3.2 <3.7 18.0 >15.0 Wrist 5th Digit 3.2 14.0 44 >38  Motor Summary Table    Stim Site NR Onset (ms) Norm Onset (ms) O-P Amp (mV) Norm O-P Amp Site1 Site2 Delta-0 (ms) Dist (cm) Vel (m/s) Norm Vel (m/s) Left Median Motor (Abd Poll Brev)  32.7C Wrist    3.6 <4.2 5.8 >5 Elbow Wrist 4.1 20.0 *49 >50 Elbow    7.7  5.7        Left Ulnar Motor (Abd Dig Min)  32.8C Wrist    3.2 <4.2 11.6 >3 B Elbow Wrist 3.1 18.5 60 >53 B Elbow    6.3  5.6  A Elbow B Elbow 1.1 9.5 86 >53 A Elbow    7.4  11.9         EMG    Side Muscle Nerve Root Ins Act Fibs Psw Amp Dur Poly Recrt Int Dennie Bible Comment Left Abd Poll Brev Median C8-T1 Nml Nml Nml Nml Nml 0 Nml Nml  Left 1stDorInt Ulnar C8-T1 Nml Nml Nml Nml Nml 0 Nml Nml  Left PronatorTeres Median C6-7 Nml Nml Nml Nml Nml 0 Nml Nml  Left Biceps Musculocut C5-6 Nml Nml Nml Nml Nml 0 Nml Nml  Left Deltoid Axillary C5-6 Nml Nml Nml Nml Nml 0 Nml Nml    Nerve Conduction Studies Anti Sensory Left/Right Comparison    Stim Site L Lat (ms) R Lat (ms) L-R Lat (ms) L Amp (V) R Amp (V) L-R Amp (%) Site1 Site2 L Vel (m/s) R Vel (m/s) L-R Vel (m/s) Median Acr Palm Anti  Sensory (2nd Digit)  33.1C Wrist *3.9   16.0   Wrist Palm    Palm 1.9   21.1        Radial Anti Sensory (Base 1st Digit)  32.4C Wrist  2.0   36.2   Wrist Base 1st Digit    Ulnar Anti Sensory (5th Digit)  33.1C Wrist 3.2   18.0   Wrist 5th Digit 44    Motor Left/Right Comparison    Stim Site L Lat (ms) R Lat (ms) L-R Lat (ms) L Amp (mV) R Amp (mV) L-R Amp (%) Site1 Site2 L Vel (m/s) R Vel (m/s) L-R Vel (m/s) Median Motor (Abd Poll Brev)  32.7C Wrist 3.6   5.8   Elbow Wrist *49   Elbow 7.7   5.7        Ulnar Motor (Abd Dig Min)  32.8C Wrist 3.2   11.6   B Elbow Wrist 60   B Elbow 6.3   5.6   A Elbow B Elbow 86   A Elbow 7.4   11.9          Waveforms:            Clinical History: 08/04/2015 EMG/NCS Impression: The above electrodiagnostic study is ABNORMAL and reveals evidence of: 1. A moderate right median nerve entrapment at the wrist (carpal tunnel syndrome) affecting sensory and motor components.  2. A mild left median nerve entrapment at the wrist (carpal tunnel syndrome) affecting sensory components.   There is no significant electrodiagnostic evidence of any other focal nerve entrapment, brachial plexopathy or cervical radiculopathy.  ------- 08/03/2013 EMG/NCS Impression: The above electrodiagnostic study is ABNORMAL and reveals evidence suggestive of mild chronic C5 radiculopathy on the left.    There is no significant electrodiagnostic evidence of any other focal nerve entrapment, brachial plexopathy or generalized peripheral neuropathy.   *Clinically, the differential diagnosis should include radiculitis/radiculopathy, median nerve neuritis, myofascial pain and nonspecific neurogenic thoracic outlet syndrome.   She reports that she has quit smoking. Her smoking use included cigarettes. She has never used smokeless tobacco.   Recent Labs (within last 365 days) No results for input(s):  HGBA1C, LABURIC in the last 8760 hours.   Objective:  VS:  HT:    WT:   BMI:     BP:   HR: bpm  TEMP: ( )  RESP:  Physical Exam Musculoskeletal:        General: No swelling, tenderness or deformity.     Comments: Inspection reveals no atrophy of the bilateral APB or FDI or hand intrinsics. There is no swelling, color changes, allodynia or dystrophic changes. There is 5 out of 5 strength in the bilateral wrist extension, finger abduction and long finger flexion. There is intact sensation to light touch in all dermatomal and peripheral nerve distributions. There is a negative Hoffmann's test bilaterally.  Skin:    General: Skin is warm and dry.     Findings: No erythema or rash.  Neurological:     General: No focal deficit present.     Mental Status: She is alert and oriented to person, place, and time.     Motor: No weakness or abnormal muscle tone.     Coordination: Coordination normal.  Psychiatric:        Mood and Affect: Mood normal.        Behavior: Behavior normal.     Ortho Exam Imaging: No results found.  Past Medical/Family/Surgical/Social History: Medications & Allergies reviewed per EMR, new medications updated.  Patient Active Problem List  Diagnosis Date Noted . DJD (degenerative joint disease), cervical 09/18/2016 . Other fatigue 09/18/2016 . Fibromyalgia 09/18/2016 . Primary osteoarthritis of both knees 09/18/2016 . Primary insomnia 09/18/2016 . Vitamin D deficiency  09/18/2016 . Carpal tunnel syndrome, right 09/05/2015   Class: Chronic . Trigger finger, acquired 09/05/2015   Class: Chronic . Pulmonary nodules 01/16/2013 . Dyspnea 01/13/2013   Past Medical History: Diagnosis Date . Anxiety   takes Ativan daily . Back pain   stenosis and buldging disc . Bone spur   neck and buldging disc . Depression   takes Cymbalta daily . Diabetes (HCC)   takes Metformin daily . Diverticulosis  . GERD (gastroesophageal reflux disease)    takes Prevacid daily . History of bronchitis   > 5 yrs ago . History of hiatal hernia  . Hypothyroidism   takes Synthroid daily . IBS (irritable bowel syndrome)  . Insomnia   doesn't take any meds . Joint pain  . Lung nodule   right middle lobe-was being followed by Dr.Burney.Medical Md is keeping up with this . Migraine  . Migraine   last one 08/28/15 . Nocturia  . Numbness and tingling in hands  . Sleep apnea  . Thoracic outlet syndrome  . Urinary urgency    Family History Problem Relation Age of Onset . COPD Mother  . Cancer Mother  . Heart disease Paternal Grandmother  . Heart disease Paternal Grandfather  . Heart disease Maternal Grandmother  . Heart disease Maternal Grandfather    Past Surgical History: Procedure Laterality Date . ABDOMINAL EXPLORATION SURGERY    cancer cells in cervix . CARPAL TUNNEL RELEASE Right 09/05/2015  Procedure: RIGHT OPEN CARPAL TUNNEL RELEASE, RIGHT LONG FINGER TRIGGER RELEASE,RIGHT INDEX FINGER TRIGGER RELEASE;  Surgeon: Kerrin Champagne, MD;  Location: [facility] OR;  Service: Orthopedics;  Laterality: Right; . CHOLECYSTECTOMY   . COLONOSCOPY   . ESOPHAGOGASTRODUODENOSCOPY   . THYROIDECTOMY   . TONSILLECTOMY    adenoidectomy . TRIGGER FINGER RELEASE Right 09/05/2015  Procedure: RELEASE TRIGGER FINGER/A-1 PULLEY;  Surgeon: Kerrin Champagne, MD;  Location: [facility] OR;  Service: Orthopedics;  Laterality: Right;   Social History   Occupational History . Not on file Tobacco Use . Smoking status: Former Smoker   Types: Cigarettes . Smokeless tobacco: Never Used Substance and Sexual Activity . Alcohol use: No . Drug use: No . Sexual activity: Yes   Birth control/protection: Post-menopausal   Procedures by Tyrell Antonio, MD at 06/12/2019 9:45 AM Author: Tyrell Antonio, MD Author Type: Physician Filed: 06/15/2019 6:06 AM Note Status: Signed Cosign: Cosign Not Required Encounter Date:  06/12/2019 Editor: Tyrell Antonio, MD (Physician) Procedure Orders: 1. NCV with EMG (electromyography) ([id number]) ordered by Tyrell Antonio, MD at 06/12/19 0959 Pre-procedure Diagnoses 1. Paresthesia of skin (R20.2)    EMG & NCV Findings: Evaluation of the left median motor nerve showed decreased conduction velocity (Elbow-Wrist, 49 m/s).  The left median (across palm) sensory nerve showed prolonged distal peak latency (Wrist, 3.9 ms).  All remaining nerves (as indicated in the following tables) were within normal limits.    All examined muscles (as indicated in the following table) showed no evidence of electrical instability.    Impression: The above electrodiagnostic study is ABNORMAL and reveals evidence of a mild left median nerve entrapment at the wrist (carpal tunnel syndrome) affecting sensory components. **This represents very little change from the prior electrodiagnostic study a few years ago.  There is no significant electrodiagnostic evidence of any other focal nerve entrapment, brachial plexopathy or cervical radiculopathy.   Recommendations: 1.  Follow-up with referring physician. 2.  Continue current management of symptoms. 3.  Continue use of resting splint at night-time and as needed during the day. 4.  Suggest carpal tunnel injection or consider hydrodissection under ultrasound with Dr. Lavada Mesi.  ___________________________ Naaman Plummer FAAPMR Board Certified, American Board of Physical Medicine and Rehabilitation   Nerve Conduction Studies Anti Sensory Summary Table    Stim Site NR Peak (ms) Norm Peak (ms) P-T Amp (V) Norm P-T Amp Site1 Site2 Delta-P (ms) Dist (cm) Vel (m/s) Norm Vel (m/s) Left Median Acr Palm Anti Sensory (2nd Digit)  33.1C Wrist    *3.9 <3.6 16.0 >10 Wrist Palm 2.0 0.0   Palm    1.9 <2.0 21.1        Left Radial Anti Sensory (Base 1st Digit)  32.4C Wrist    2.0 <3.1 36.2  Wrist Base 1st Digit 2.0 0.0   Left  Ulnar Anti Sensory (5th Digit)  33.1C Wrist    3.2 <3.7 18.0 >15.0 Wrist 5th Digit 3.2 14.0 44 >38  Motor Summary Table    Stim Site NR Onset (ms) Norm Onset (ms) O-P Amp (mV) Norm O-P Amp Site1 Site2 Delta-0 (ms) Dist (cm) Vel (m/s) Norm Vel (m/s) Left Median Motor (Abd Poll Brev)  32.7C Wrist    3.6 <4.2 5.8 >5 Elbow Wrist 4.1 20.0 *49 >50 Elbow    7.7  5.7        Left Ulnar Motor (Abd Dig Min)  32.8C Wrist    3.2 <4.2 11.6 >3 B Elbow Wrist 3.1 18.5 60 >53 B Elbow    6.3  5.6  A Elbow B Elbow 1.1 9.5 86 >53 A Elbow    7.4  11.9         EMG    Side Muscle Nerve Root Ins Act Fibs Psw Amp Dur Poly Recrt Int Dennie Bible Comment Left Abd Poll Brev Median C8-T1 Nml Nml Nml Nml Nml 0 Nml Nml  Left 1stDorInt Ulnar C8-T1 Nml Nml Nml Nml Nml 0 Nml Nml  Left PronatorTeres Median C6-7 Nml Nml Nml Nml Nml 0 Nml Nml  Left Biceps Musculocut C5-6 Nml Nml Nml Nml Nml 0 Nml Nml  Left Deltoid Axillary C5-6 Nml Nml Nml Nml Nml 0 Nml Nml    Nerve Conduction Studies Anti Sensory Left/Right Comparison    Stim Site L Lat (ms) R Lat (ms) L-R Lat (ms) L Amp (V) R Amp (V) L-R Amp (%) Site1 Site2 L Vel (m/s) R Vel (m/s) L-R Vel (m/s) Median Acr Palm Anti Sensory (2nd Digit)  33.1C Wrist *3.9   16.0   Wrist Palm    Palm 1.9   21.1        Radial Anti Sensory (Base 1st Digit)  32.4C Wrist 2.0   36.2   Wrist Base 1st Digit    Ulnar Anti Sensory (5th Digit)  33.1C Wrist 3.2   18.0   Wrist 5th Digit 44    Motor Left/Right Comparison    Stim Site L Lat (ms) R Lat (ms) L-R Lat (ms) L Amp (mV) R Amp (mV) L-R Amp (%) Site1 Site2 L Vel (m/s) R Vel (m/s) L-R Vel (m/s) Median Motor (Abd Poll Brev)  32.7C Wrist 3.6   5.8   Elbow Wrist *49   Elbow 7.7   5.7        Ulnar Motor (Abd Dig Min)  32.8C Wrist 3.2   11.6   B Elbow Wrist 60   B Elbow 6.3   5.6   A Elbow B Elbow 86   A Elbow 7.4   11.9           Waveforms:  Progress Notes by Tyrell AntonioNewton, Frederic, MD at 06/12/2019 9:45 AM Author: Tyrell AntonioNewton, Frederic, MD Author Type: Physician Filed: 06/15/2019 6:06 AM Note Status: Signed Cosign: Cosign Not Required Encounter Date: 06/12/2019 Editor: Tyrell AntonioNewton, Frederic, MD (Physician) Prior Versions: 1. Cindie Crumblyimmons, Courtney, RT Radiographer, therapeutic(Technologist) at 06/12/2019 9:55 AM - Sign when Signing Visit     Numeric Pain Rating Scale and Functional Assessment Average Pain 7   In the last MONTH (on 0-10 scale) has pain interfered with the following?  1. General activity like being  able to carry out your everyday physical activities such as walking, climbing stairs, carrying groceries, or moving a chair?  Rating(5)      Instructions   Return for Vira BrownsJames , M.D.. Communications   [facility] CC Chart Rep sent to No Pcp Per Patient, MD and Kerrin ChampagneJames E , MD  Sent 06/15/2019 [employer]  Electronic signature on 06/12/2019 9:35 AM - Leta JunglingE-signed Communication Routing History  Recipient Method Sent by Date Sent No Pcp Per Patient, MD In [place] Newton, Frederic, MD 06/15/2019   Kerrin ChampagneJames E , MD In [place] Newton, Frederic, MD 06/15/2019   No questionnaires available.       Orders Placed   NCV with EMG (electromyography) Medication Changes   None  Medication List Visit Diagnoses   Paresthesia of skin  Problem List Level of Service  All Charges for This Encounter  Code Description Service Date Service Provider Modifiers Qty [phone] PR NEEDLE EMG EA EXTREMTY W/PARASPINL AREA COMPLETE 06/12/2019 Tyrell AntonioNewton, Frederic, MD  1 [phone] PR MOTOR &/SENS 5-6 NRV CNDJ PRECONF ELTRODE LIMB 06/12/2019 Tyrell AntonioNewton, Frederic, MD  1      Subjective: Chief Complaint  Patient presents with  . Left Hand - Follow-up    EMG/NCS Review    58 year old female right handed with history of left hand numbness and pain. Had left hand trigger finger injection with relief of pain in the fingers but  persistent pain and numbness into the left Index through long fingers and thumb. Previous EMG/NCV with mild changes in the left medial n sensory. EMG is negative for radiculopathy. Last cervical   MRI with left C5, C6 and C7 foramenal narrowing that is mild and left C6-7 hemicord deflection and impression without cord changes. She is not experiencing walking difficulty or bowel or bladder difficulty. Take benafiber in her coffee to decrease constipation. Has been taking subxone and ativan.   Review of Systems  Constitutional: Negative.   HENT: Negative.   Eyes: Negative.   Respiratory: Negative.   Cardiovascular: Negative.   Gastrointestinal: Negative.   Endocrine: Negative.   Genitourinary: Negative.   Musculoskeletal: Negative.  Negative for neck pain and neck stiffness.  Skin: Negative.   Allergic/Immunologic: Negative.   Neurological: Positive for weakness and numbness.  Hematological: Negative.   Psychiatric/Behavioral: Negative.      Objective: Vital Signs: BP 130/74   Pulse (!) 58   Ht 5\' 5"  (1.651 m)   Wt 189 lb (85.7 kg)   BMI 31.45 kg/m   Physical Exam Constitutional:      Appearance: She is well-developed.  HENT:     Head: Normocephalic and atraumatic.  Eyes:     Pupils: Pupils are equal, round, and reactive to light.  Neck:     Musculoskeletal: Normal range of motion and neck supple.  Pulmonary:     Effort: Pulmonary effort is normal.     Breath sounds: Normal breath sounds.  Abdominal:     General: Bowel sounds are normal.  Palpations: Abdomen is soft.  Skin:    General: Skin is warm and dry.  Neurological:     Mental Status: She is alert and oriented to person, place, and time.  Psychiatric:        Behavior: Behavior normal.        Thought Content: Thought content normal.        Judgment: Judgment normal.     Back Exam   Tenderness  The patient is experiencing tenderness in the cervical.  Range of Motion  Extension: abnormal  Flexion:  abnormal  Lateral bend right: abnormal  Lateral bend left: abnormal  Rotation right: abnormal  Rotation left: abnormal   Muscle Strength  Right Quadriceps:  5/5  Left Quadriceps:  5/5  Right Hamstrings:  5/5  Left Hamstrings:  5/5   Tests  Straight leg raise right: negative Straight leg raise left: negative  Reflexes  Patellar: normal Achilles: normal Babinski's sign: normal   Other  Toe walk: normal Heel walk: normal Sensation: normal Gait: normal  Erythema: no back redness Scars: absent      Specialty Comments:  No specialty comments available.  Imaging: No results found.   PMFS History: Patient Active Problem List   Diagnosis Date Noted  . Carpal tunnel syndrome, right 09/05/2015    Priority: High    Class: Chronic  . Trigger finger, acquired 09/05/2015    Priority: High    Class: Chronic  . DJD (degenerative joint disease), cervical 09/18/2016  . Other fatigue 09/18/2016  . Fibromyalgia 09/18/2016  . Primary osteoarthritis of both knees 09/18/2016  . Primary insomnia 09/18/2016  . Vitamin D deficiency 09/18/2016  . Pulmonary nodules 01/16/2013  . Dyspnea 01/13/2013   Past Medical History:  Diagnosis Date  . Anxiety    takes Ativan daily  . Back pain    stenosis and buldging disc  . Bone spur    neck and buldging disc  . Depression    takes Cymbalta daily  . Diabetes (HCC)    takes Metformin daily  . Diverticulosis   . GERD (gastroesophageal reflux disease)    takes Prevacid daily  . History of bronchitis    > 5 yrs ago  . History of hiatal hernia   . Hypothyroidism    takes Synthroid daily  . IBS (irritable bowel syndrome)   . Insomnia    doesn't take any meds  . Joint pain   . Lung nodule    right middle lobe-was being followed by Dr.Burney.Medical Md is keeping up with this  . Migraine   . Migraine    last one 08/28/15  . Nocturia   . Numbness and tingling in hands   . Sleep apnea   . Thoracic outlet syndrome   . Urinary  urgency     Family History  Problem Relation Age of Onset  . COPD Mother   . Cancer Mother   . Heart disease Paternal Grandmother   . Heart disease Paternal Grandfather   . Heart disease Maternal Grandmother   . Heart disease Maternal Grandfather     Past Surgical History:  Procedure Laterality Date  . ABDOMINAL EXPLORATION SURGERY     cancer cells in cervix  . CARPAL TUNNEL RELEASE Right 09/05/2015   Procedure: RIGHT OPEN CARPAL TUNNEL RELEASE, RIGHT LONG FINGER TRIGGER RELEASE,RIGHT INDEX FINGER TRIGGER RELEASE;  Surgeon: Kerrin ChampagneJames E , MD;  Location: [facility] OR;  Service: Orthopedics;  Laterality: Right;  . CHOLECYSTECTOMY    . COLONOSCOPY    .  ESOPHAGOGASTRODUODENOSCOPY    . THYROIDECTOMY    . TONSILLECTOMY     adenoidectomy  . TRIGGER FINGER RELEASE Right 09/05/2015   Procedure: RELEASE TRIGGER FINGER/A-1 PULLEY;  Surgeon: Kerrin Champagne, MD;  Location: [facility] OR;  Service: Orthopedics;  Laterality: Right;   Social History   Occupational History  . Not on file  Tobacco Use  . Smoking status: Former Smoker    Types: Cigarettes  . Smokeless tobacco: Never Used  Substance and Sexual Activity  . Alcohol use: No  . Drug use: No  . Sexual activity: Yes    Birth control/protection: Post-menopausal

## 2019-06-18 NOTE — Patient Instructions (Addendum)
Carpal Tunnel Syndrome  Carpal tunnel syndrome is a condition that causes pain in your hand and arm. The carpal tunnel is a narrow area located on the palm side of your wrist. Repeated wrist motion or certain diseases may cause swelling within the tunnel. This swelling pinches the main nerve in the wrist (median nerve). What are the causes? This condition may be caused by:  Repeated wrist motions.  Wrist injuries.  Arthritis.  A cyst or tumor in the carpal tunnel.  Fluid buildup during pregnancy. Sometimes the cause of this condition is not known. What increases the risk? This condition is more likely to develop in:  People who have jobs that cause them to repeatedly move their wrists in the same motion, such as Health visitorbutchers and cashiers.  Women.  People with certain conditions, such as: ? Diabetes. ? Obesity. ? An underactive thyroid (hypothyroidism). ? Kidney failure. What are the signs or symptoms? Symptoms of this condition include:  A tingling feeling in your fingers, especially in your thumb, index, and middle fingers.  Tingling or numbness in your hand.  An aching feeling in your entire arm, especially when your wrist and elbow are bent for long periods of time.  Wrist pain that goes up your arm to your shoulder.  Pain that goes down into your palm or fingers.  A weak feeling in your hands. You may have trouble grabbing and holding items. Your symptoms may feel worse during the night. How is this diagnosed? This condition is diagnosed with a medical history and physical exam. You may also have tests, including:  An electromyogram (EMG). This test measures electrical signals sent by your nerves into the muscles.  X-rays. How is this treated? Treatment for this condition includes:  Lifestyle changes. It is important to stop doing or modify the activity that caused your condition.  Physical or occupational therapy.  Medicines for pain and inflammation. This may  include medicine that is injected into your wrist.  A wrist splint.  Surgery. Follow these instructions at home: If you have a splint:   Wear it as told by your health care provider. Remove it only as told by your health care provider.  Loosen the splint if your fingers become numb and tingle, or if they turn cold and blue.  Keep the splint clean and dry. General instructions   Take over-the-counter and prescription medicines only as told by your health care provider.  Rest your wrist from any activity that may be causing your pain. If your condition is work related, talk to your employer about changes that can be made, such as getting a wrist pad to use while typing.  If directed, apply ice to the painful area: ? Put ice in a plastic bag. ? Place a towel between your skin and the bag. ? Leave the ice on for 20 minutes, 2-3 times per day.  Keep all follow-up visits as told by your health care provider. This is important.  Do any exercises as told by your health care provider, physical therapist, or occupational therapist. Contact a health care provider if:  You have new symptoms.  Your pain is not controlled with medicines.  Your symptoms get worse. This information is not intended to replace advice given to you by your health care provider. Make sure you discuss any questions you have with your health care provider. Document Released: 09/14/2000 Document Revised: 01/26/2016 Document Reviewed: 05/29/2017 Elsevier Interactive Patient Education  2017 [employer] May use the wrist splints  as needed.  Our office will contact you to schedule for a left open carpal tunnel release. Kandice Hams is the surgery scheduler and she will get approval from your insurance and call you. Risk of surgery includes risk infection 1:200, bleeding is not usual, 1 in 1,000,000 risk of blood loss that is significant. Risk to the nerve is 1:30,000. [employer] hemp CBD oil capsules 60 capsules  per bottle 5,000mg -7,000mg  Per bottle.

## 2019-06-25 DIAGNOSIS — G43719 Chronic migraine without aura, intractable, without status migrainosus: Secondary | ICD-10-CM | POA: Diagnosis not present

## 2019-06-25 DIAGNOSIS — M542 Cervicalgia: Secondary | ICD-10-CM | POA: Diagnosis not present

## 2019-06-25 DIAGNOSIS — F419 Anxiety disorder, unspecified: Secondary | ICD-10-CM | POA: Diagnosis not present

## 2019-06-25 DIAGNOSIS — M797 Fibromyalgia: Secondary | ICD-10-CM | POA: Diagnosis not present

## 2019-07-10 ENCOUNTER — Other Ambulatory Visit: Payer: Self-pay

## 2019-08-04 ENCOUNTER — Other Ambulatory Visit: Payer: Self-pay

## 2019-08-04 ENCOUNTER — Encounter (HOSPITAL_BASED_OUTPATIENT_CLINIC_OR_DEPARTMENT_OTHER): Payer: Self-pay

## 2019-08-05 ENCOUNTER — Ambulatory Visit: Payer: BC Managed Care – PPO | Admitting: Surgery

## 2019-08-06 ENCOUNTER — Encounter: Payer: Self-pay | Admitting: Surgery

## 2019-08-06 ENCOUNTER — Ambulatory Visit (INDEPENDENT_AMBULATORY_CARE_PROVIDER_SITE_OTHER): Payer: BC Managed Care – PPO | Admitting: Surgery

## 2019-08-06 ENCOUNTER — Other Ambulatory Visit: Payer: Self-pay

## 2019-08-06 VITALS — BP 138/85 | HR 78 | Ht 65.0 in | Wt 203.6 lb

## 2019-08-06 DIAGNOSIS — G5602 Carpal tunnel syndrome, left upper limb: Secondary | ICD-10-CM

## 2019-08-06 DIAGNOSIS — M65332 Trigger finger, left middle finger: Secondary | ICD-10-CM

## 2019-08-06 NOTE — Progress Notes (Signed)
Notified pt to come in for lab work, pt states will come in the morning (Friday)

## 2019-08-06 NOTE — Progress Notes (Signed)
58 year old white female history of left carpal tunnel release and left long trigger finger comes in for preop evaluation.  States that symptoms unchanged from previous visit.  She is wanting to proceed with left carpal tunnel release and left long trigger finger release as scheduled.  Today history and physical performed.  Patient stated that she has a history of thoracic outlet syndrome and cannot have a block.  She also had some concerns of being in awake during a previous surgical procedure and this bothered her.  She wants to make sure that she is completely asleep during her surgery.  I advised her to make sure she discusses that issue with the anesthesiologist/CRNA that morning.  All questions answered.

## 2019-08-07 ENCOUNTER — Encounter (HOSPITAL_BASED_OUTPATIENT_CLINIC_OR_DEPARTMENT_OTHER)
Admission: RE | Admit: 2019-08-07 | Discharge: 2019-08-07 | Disposition: A | Discharge status: Home / Self Care | Payer: BC Managed Care – PPO | Source: Ambulatory Visit | Attending: Specialist | Admitting: Specialist

## 2019-08-07 ENCOUNTER — Other Ambulatory Visit (HOSPITAL_COMMUNITY)
Admission: RE | Admit: 2019-08-07 | Discharge: 2019-08-07 | Disposition: A | Discharge status: Home / Self Care | Payer: BC Managed Care – PPO | Source: Ambulatory Visit | Attending: Specialist | Admitting: Specialist

## 2019-08-07 DIAGNOSIS — G5602 Carpal tunnel syndrome, left upper limb: Secondary | ICD-10-CM | POA: Insufficient documentation

## 2019-08-07 DIAGNOSIS — M65332 Trigger finger, left middle finger: Secondary | ICD-10-CM | POA: Diagnosis not present

## 2019-08-07 DIAGNOSIS — Z20828 Contact with and (suspected) exposure to other viral communicable diseases: Secondary | ICD-10-CM | POA: Diagnosis not present

## 2019-08-07 DIAGNOSIS — Z01818 Encounter for other preprocedural examination: Secondary | ICD-10-CM | POA: Diagnosis present

## 2019-08-07 LAB — COMPREHENSIVE METABOLIC PANEL
ALT: 27 U/L (ref 0–44)
AST: 27 U/L (ref 15–41)
Albumin: 3.8 g/dL (ref 3.5–5.0)
Alkaline Phosphatase: 79 U/L (ref 38–126)
Anion gap: 10 (ref 5–15)
BUN: 11 mg/dL (ref 6–20)
CO2: 25 mmol/L (ref 22–32)
Calcium: 9.4 mg/dL (ref 8.9–10.3)
Chloride: 102 mmol/L (ref 98–111)
Creatinine, Ser: 0.7 mg/dL (ref 0.44–1.00)
GFR calc Af Amer: >60 mL/min (ref >60)
GFR calc non Af Amer: >60 mL/min (ref >60)
Glucose, Bld: 145 mg/dL — ABNORMAL HIGH (ref 70–99)
Potassium: 3.8 mmol/L (ref 3.5–5.1)
Sodium: 137 mmol/L (ref 135–145)
Total Bilirubin: 0.8 mg/dL (ref 0.3–1.2)
Total Protein: 6.7 g/dL (ref 6.5–8.1)

## 2019-08-07 LAB — SURGICAL PCR SCREEN
MRSA, PCR: NEGATIVE
Staphylococcus aureus: NEGATIVE

## 2019-08-07 LAB — CBC
HCT: 38 % (ref 36.0–46.0)
Hemoglobin: 13.6 g/dL (ref 12.0–15.0)
MCH: 30 pg (ref 26.0–34.0)
MCHC: 35.8 g/dL (ref 30.0–36.0)
MCV: 83.7 fL (ref 80.0–100.0)
Platelets: 160 10*3/uL (ref 150–400)
RBC: 4.54 MIL/uL (ref 3.87–5.11)
RDW: 12.1 % (ref 11.5–15.5)
WBC: 4.8 10*3/uL (ref 4.0–10.5)
nRBC: 0 % (ref 0.0–0.2)

## 2019-08-07 NOTE — Progress Notes (Signed)

## 2019-08-08 LAB — NOVEL CORONAVIRUS, NAA (HOSP ORDER, SEND-OUT TO REF LAB; TAT 18-24 HRS): SARS-CoV-2, NAA: NOT DETECTED

## 2019-08-10 NOTE — H&P (Signed)
Holly [name] is an 58 y.o. female.   Chief Complaint: Left hand pain numbness and tingling.  Left long trigger finger HPI: 58 year old white female history of left carpal tunnel release and left long trigger finger comes in for preop evaluation.  States that symptoms unchanged from previous visit.  She is wanting to proceed with left carpal tunnel release and left long trigger finger release as scheduled.  Today history and physical performed.  Patient stated that she has a history of thoracic outlet syndrome and cannot have a block.  She also had some concerns of being in awake during a previous surgical procedure and this bothered her.  She wants to make sure that she is completely asleep during her surgery.  I advised her to make sure she discusses that issue with the anesthesiologist/CRNA that morning.  Past Medical History:  Diagnosis Date  . Anxiety    takes Ativan daily  . Back pain    stenosis and buldging disc  . Bone spur    neck and buldging disc  . Depression    takes Cymbalta daily  . Diabetes (HCC)    takes Metformin daily  . Diverticulosis   . GERD (gastroesophageal reflux disease)    takes Prevacid daily  . History of bronchitis    > 5 yrs ago  . History of hiatal hernia   . Hypothyroidism    takes Synthroid daily  . IBS (irritable bowel syndrome)   . Insomnia    doesn't take any meds  . Joint pain   . Lung nodule    right middle lobe-was being followed by Dr.Burney.Medical Md is keeping up with this  . Migraine   . Migraine    last one 08/28/15  . Nocturia   . Numbness and tingling in hands   . PONV (postoperative nausea and vomiting)   . Sleep apnea   . Thoracic outlet syndrome   . TOS (thoracic outlet syndrome)   . Urinary urgency     Past Surgical History:  Procedure Laterality Date  . ABDOMINAL EXPLORATION SURGERY     cancer cells in cervix  . CARPAL TUNNEL RELEASE Right 09/05/2015   Procedure: RIGHT OPEN CARPAL TUNNEL RELEASE, RIGHT LONG FINGER  TRIGGER RELEASE,RIGHT INDEX FINGER TRIGGER RELEASE;  Surgeon: Kerrin Champagne, MD;  Location: [facility] OR;  Service: Orthopedics;  Laterality: Right;  . CHOLECYSTECTOMY    . COLONOSCOPY    . ESOPHAGOGASTRODUODENOSCOPY    . THYROIDECTOMY    . TONSILLECTOMY     adenoidectomy  . TRIGGER FINGER RELEASE Right 09/05/2015   Procedure: RELEASE TRIGGER FINGER/A-1 PULLEY;  Surgeon: Kerrin Champagne, MD;  Location: [facility] OR;  Service: Orthopedics;  Laterality: Right;    Family History  Problem Relation Age of Onset  . COPD Mother   . Cancer Mother   . Heart disease Paternal Grandmother   . Heart disease Paternal Grandfather   . Heart disease Maternal Grandmother   . Heart disease Maternal Grandfather    Social History:  reports that she has quit smoking. Her smoking use included cigarettes. She has never used smokeless tobacco. She reports that she does not drink alcohol or use drugs.  Allergies:  Allergies  Allergen Reactions  . Cefuroxime Axetil Other (See Comments)    Obsessive Bowel.   . Effexor Xr [Venlafaxine Hcl Er] Hives  . Sumatriptan Other (See Comments)    Other Reaction: heart issues   . Erenumab-Aooe Hives    " Smooth Muscle cramping in Esophagus "  .  Garlic Other (See Comments)  . Onion Other (See Comments)  . Statins   . Crestor [Rosuvastatin Calcium] Rash  . Triptans Palpitations    No medications prior to admission.    No results found for this or any previous visit (from the past 48 hour(s)). No results found.  Review of Systems  Constitutional: Negative.   HENT: Negative.   Respiratory: Negative.   Cardiovascular: Negative.   Genitourinary: Negative.   Musculoskeletal: Negative.   Skin: Negative.   Neurological: Positive for tingling.  Psychiatric/Behavioral: Negative.     Height 5\' 5"  (1.651 m), weight 90.7 kg. Physical Exam  Constitutional: She is oriented to person, place, and time. She appears well-developed. No distress.  HENT:  Head: Normocephalic.  Eyes:  Pupils are equal, round, and reactive to light. EOM are normal.  Neck: Normal range of motion.  Cardiovascular: Normal heart sounds.  Respiratory: Breath sounds normal. She is in respiratory distress.  GI: Soft. Bowel sounds are normal. She exhibits no distension. There is no abdominal tenderness.  Musculoskeletal:        General: Tenderness present.  Neurological: She is alert and oriented to person, place, and time.  Skin: Skin is warm and dry.  Psychiatric: She has a normal mood and affect.     Assessment/Plan Left carpal tunnel syndrome and left long trigger finger  We will proceed with left carpal tunnel release left long trigger finger release.  Surgical procedure discussed in detail.  All questions answered.  Benjiman Core, PA-C 08/10/2019, 9:55 AM

## 2019-08-11 ENCOUNTER — Ambulatory Visit (HOSPITAL_BASED_OUTPATIENT_CLINIC_OR_DEPARTMENT_OTHER): Payer: BC Managed Care – PPO | Admitting: Anesthesiology

## 2019-08-11 ENCOUNTER — Ambulatory Visit (HOSPITAL_BASED_OUTPATIENT_CLINIC_OR_DEPARTMENT_OTHER)
Admission: RE | Admit: 2019-08-11 | Discharge: 2019-08-11 | Disposition: A | Discharge status: Home / Self Care | Payer: BC Managed Care – PPO | Attending: Specialist | Admitting: Specialist

## 2019-08-11 ENCOUNTER — Encounter (HOSPITAL_BASED_OUTPATIENT_CLINIC_OR_DEPARTMENT_OTHER)
Admission: RE | Disposition: A | Discharge status: Home / Self Care | Payer: Self-pay | Source: Home / Self Care | Attending: Specialist

## 2019-08-11 ENCOUNTER — Other Ambulatory Visit: Payer: Self-pay

## 2019-08-11 ENCOUNTER — Encounter (HOSPITAL_BASED_OUTPATIENT_CLINIC_OR_DEPARTMENT_OTHER): Payer: Self-pay

## 2019-08-11 DIAGNOSIS — Z79890 Hormone replacement therapy (postmenopausal): Secondary | ICD-10-CM | POA: Diagnosis not present

## 2019-08-11 DIAGNOSIS — K219 Gastro-esophageal reflux disease without esophagitis: Secondary | ICD-10-CM | POA: Insufficient documentation

## 2019-08-11 DIAGNOSIS — E119 Type 2 diabetes mellitus without complications: Secondary | ICD-10-CM | POA: Insufficient documentation

## 2019-08-11 DIAGNOSIS — Z7984 Long term (current) use of oral hypoglycemic drugs: Secondary | ICD-10-CM | POA: Insufficient documentation

## 2019-08-11 DIAGNOSIS — G4730 Sleep apnea, unspecified: Secondary | ICD-10-CM | POA: Insufficient documentation

## 2019-08-11 DIAGNOSIS — M65332 Trigger finger, left middle finger: Secondary | ICD-10-CM | POA: Diagnosis not present

## 2019-08-11 DIAGNOSIS — Z87891 Personal history of nicotine dependence: Secondary | ICD-10-CM | POA: Diagnosis not present

## 2019-08-11 DIAGNOSIS — F419 Anxiety disorder, unspecified: Secondary | ICD-10-CM | POA: Insufficient documentation

## 2019-08-11 DIAGNOSIS — G5602 Carpal tunnel syndrome, left upper limb: Secondary | ICD-10-CM | POA: Diagnosis not present

## 2019-08-11 DIAGNOSIS — E039 Hypothyroidism, unspecified: Secondary | ICD-10-CM | POA: Diagnosis not present

## 2019-08-11 DIAGNOSIS — F329 Major depressive disorder, single episode, unspecified: Secondary | ICD-10-CM | POA: Insufficient documentation

## 2019-08-11 HISTORY — PX: CARPAL TUNNEL RELEASE: SHX101

## 2019-08-11 HISTORY — PX: TRIGGER FINGER RELEASE: SHX641

## 2019-08-11 HISTORY — DX: Nausea with vomiting, unspecified: R11.2

## 2019-08-11 HISTORY — DX: Brachial plexus disorders: G54.0

## 2019-08-11 HISTORY — DX: Other specified postprocedural states: Z98.890

## 2019-08-11 LAB — GLUCOSE, CAPILLARY: Glucose-Capillary: 119 mg/dL — ABNORMAL HIGH (ref 70–99)

## 2019-08-11 SURGERY — CARPAL TUNNEL RELEASE
Anesthesia: General | Site: Hand | Laterality: Left

## 2019-08-11 MED ORDER — HYDROCODONE-ACETAMINOPHEN 5-325 MG PO TABS: 1.0000 | ORAL_TABLET | ORAL | 0 refills | Status: AC | PRN

## 2019-08-11 MED ORDER — SCOPOLAMINE 1 MG/3DAYS TD PT72
MEDICATED_PATCH | TRANSDERMAL | Status: DC | PRN
  Administered 2019-08-11: 1 via TRANSDERMAL

## 2019-08-11 MED ORDER — PROPOFOL 10 MG/ML IV BOLUS
INTRAVENOUS | Status: DC | PRN
  Administered 2019-08-11: 100 mg via INTRAVENOUS
  Administered 2019-08-11: 200 mg via INTRAVENOUS

## 2019-08-11 MED ORDER — ONDANSETRON HCL 4 MG/2ML IJ SOLN
INTRAMUSCULAR | Status: AC
  Filled 2019-08-11: qty 2

## 2019-08-11 MED ORDER — BUPIVACAINE HCL (PF) 0.25 % IJ SOLN
INTRAMUSCULAR | Status: AC
  Filled 2019-08-11: qty 30

## 2019-08-11 MED ORDER — FENTANYL CITRATE (PF) 100 MCG/2ML IJ SOLN: 50.0000 ug | INTRAMUSCULAR | Status: DC | PRN

## 2019-08-11 MED ORDER — BUPIVACAINE LIPOSOME 1.3 % IJ SUSP
INTRAMUSCULAR | Status: DC | PRN
  Administered 2019-08-11: 4 mL

## 2019-08-11 MED ORDER — BUPIVACAINE HCL (PF) 0.5 % IJ SOLN
INTRAMUSCULAR | Status: AC
  Filled 2019-08-11: qty 30

## 2019-08-11 MED ORDER — DEXAMETHASONE SODIUM PHOSPHATE 4 MG/ML IJ SOLN
INTRAMUSCULAR | Status: DC | PRN
  Administered 2019-08-11: 10 mg via INTRAVENOUS

## 2019-08-11 MED ORDER — MIDAZOLAM HCL 2 MG/2ML IJ SOLN
INTRAMUSCULAR | Status: AC
  Filled 2019-08-11: qty 2

## 2019-08-11 MED ORDER — LIDOCAINE HCL (PF) 1 % IJ SOLN
INTRAMUSCULAR | Status: AC
  Filled 2019-08-11: qty 30

## 2019-08-11 MED ORDER — LIDOCAINE 2% (20 MG/ML) 5 ML SYRINGE
INTRAMUSCULAR | Status: AC
  Filled 2019-08-11: qty 5

## 2019-08-11 MED ORDER — BUPIVACAINE HCL (PF) 0.5 % IJ SOLN
INTRAMUSCULAR | Status: DC | PRN
  Administered 2019-08-11: 4 mL

## 2019-08-11 MED ORDER — ONDANSETRON HCL 4 MG/2ML IJ SOLN
INTRAMUSCULAR | Status: DC | PRN
  Administered 2019-08-11: 4 mg via INTRAVENOUS

## 2019-08-11 MED ORDER — VANCOMYCIN HCL IN DEXTROSE 1-5 GM/200ML-% IV SOLN
INTRAVENOUS | Status: AC
  Filled 2019-08-11: qty 200

## 2019-08-11 MED ORDER — BUPIVACAINE LIPOSOME 1.3 % IJ SUSP
INTRAMUSCULAR | Status: AC
  Filled 2019-08-11: qty 20

## 2019-08-11 MED ORDER — ACETAMINOPHEN 500 MG PO TABS
1000.0000 mg | ORAL_TABLET | Freq: Once | ORAL | Status: AC
  Administered 2019-08-11: 1000 mg via ORAL

## 2019-08-11 MED ORDER — MIDAZOLAM HCL 2 MG/2ML IJ SOLN: 1.0000 mg | INTRAMUSCULAR | Status: DC | PRN

## 2019-08-11 MED ORDER — EPHEDRINE SULFATE 50 MG/ML IJ SOLN
INTRAMUSCULAR | Status: DC | PRN
  Administered 2019-08-11 (×2): 10 mg via INTRAVENOUS

## 2019-08-11 MED ORDER — VANCOMYCIN HCL IN DEXTROSE 1-5 GM/200ML-% IV SOLN
1000.0000 mg | INTRAVENOUS | Status: AC
  Administered 2019-08-11 (×2): 1000 mg via INTRAVENOUS

## 2019-08-11 MED ORDER — LACTATED RINGERS IV SOLN
INTRAVENOUS | Status: DC
  Administered 2019-08-11 (×2): via INTRAVENOUS

## 2019-08-11 MED ORDER — LIDOCAINE HCL (CARDIAC) PF 100 MG/5ML IV SOSY
PREFILLED_SYRINGE | INTRAVENOUS | Status: DC | PRN
  Administered 2019-08-11: 50 mg via INTRAVENOUS

## 2019-08-11 MED ORDER — ACETAMINOPHEN 500 MG PO TABS
ORAL_TABLET | ORAL | Status: AC
  Filled 2019-08-11: qty 2

## 2019-08-11 MED ORDER — MIDAZOLAM HCL 5 MG/5ML IJ SOLN
INTRAMUSCULAR | Status: DC | PRN
  Administered 2019-08-11: 2 mg via INTRAVENOUS

## 2019-08-11 MED ORDER — CHLORHEXIDINE GLUCONATE 4 % EX LIQD: 60.0000 mL | Freq: Once | CUTANEOUS | Status: DC

## 2019-08-11 MED ORDER — PROPOFOL 500 MG/50ML IV EMUL
INTRAVENOUS | Status: DC | PRN
  Administered 2019-08-11: 25 ug/kg/min via INTRAVENOUS

## 2019-08-11 MED ORDER — FENTANYL CITRATE (PF) 100 MCG/2ML IJ SOLN: 25.0000 ug | INTRAMUSCULAR | Status: DC | PRN

## 2019-08-11 MED ORDER — PROPOFOL 500 MG/50ML IV EMUL
INTRAVENOUS | Status: AC
  Filled 2019-08-11: qty 100

## 2019-08-11 MED ORDER — DEXAMETHASONE SODIUM PHOSPHATE 10 MG/ML IJ SOLN
INTRAMUSCULAR | Status: AC
  Filled 2019-08-11: qty 1

## 2019-08-11 MED ORDER — SCOPOLAMINE 1 MG/3DAYS TD PT72
MEDICATED_PATCH | TRANSDERMAL | Status: AC
  Filled 2019-08-11: qty 1

## 2019-08-11 MED ORDER — FENTANYL CITRATE (PF) 100 MCG/2ML IJ SOLN
INTRAMUSCULAR | Status: DC | PRN
  Administered 2019-08-11 (×2): 50 ug via INTRAVENOUS

## 2019-08-11 MED ORDER — FENTANYL CITRATE (PF) 100 MCG/2ML IJ SOLN
INTRAMUSCULAR | Status: AC
  Filled 2019-08-11: qty 2

## 2019-08-11 SURGICAL SUPPLY — 61 items
BAND RUBBER #18 3X1/16 STRL (MISCELLANEOUS) IMPLANT
BENZOIN TINCTURE PRP APPL 2/3 (GAUZE/BANDAGES/DRESSINGS) IMPLANT
BLADE SURG 15 STRL LF DISP TIS (BLADE) ×2 IMPLANT
BLADE SURG 15 STRL SS (BLADE) ×1
BNDG COHESIVE 1X5 TAN STRL LF (GAUZE/BANDAGES/DRESSINGS) ×3 IMPLANT
BNDG COHESIVE 2X5 TAN STRL LF (GAUZE/BANDAGES/DRESSINGS) ×3 IMPLANT
BNDG ELASTIC 3X5.8 VLCR STR LF (GAUZE/BANDAGES/DRESSINGS) ×3 IMPLANT
BNDG ESMARK 4X9 LF (GAUZE/BANDAGES/DRESSINGS) IMPLANT
CORD BIPOLAR FORCEPS 12FT (ELECTRODE) ×3 IMPLANT
COVER BACK TABLE REUSABLE LG (DRAPES) ×3 IMPLANT
COVER MAYO STAND REUSABLE (DRAPES) ×3 IMPLANT
COVER WAND RF STERILE (DRAPES) IMPLANT
CUFF TOURN SGL QUICK 18X4 (TOURNIQUET CUFF) ×3 IMPLANT
DERMABOND ADVANCED (GAUZE/BANDAGES/DRESSINGS) ×1
DERMABOND ADVANCED .7 DNX12 (GAUZE/BANDAGES/DRESSINGS) ×2 IMPLANT
DRAPE EXTREMITY T 121X128X90 (DISPOSABLE) ×3 IMPLANT
DRAPE SURG 17X23 STRL (DRAPES) ×3 IMPLANT
DRSG EMULSION OIL 3X3 NADH (GAUZE/BANDAGES/DRESSINGS) ×3 IMPLANT
DRSG PAD ABDOMINAL 8X10 ST (GAUZE/BANDAGES/DRESSINGS) IMPLANT
DURAPREP 26ML APPLICATOR (WOUND CARE) ×3 IMPLANT
ELECT REM PT RETURN 9FT ADLT (ELECTROSURGICAL)
ELECTRODE REM PT RTRN 9FT ADLT (ELECTROSURGICAL) IMPLANT
GAUZE SPONGE 4X4 12PLY STRL (GAUZE/BANDAGES/DRESSINGS) ×3 IMPLANT
GAUZE SPONGE 4X4 12PLY STRL LF (GAUZE/BANDAGES/DRESSINGS) IMPLANT
GLOVE BIOGEL M 7.0 STRL (GLOVE) ×3 IMPLANT
GLOVE BIOGEL PI IND STRL 6.5 (GLOVE) ×2 IMPLANT
GLOVE BIOGEL PI IND STRL 7.5 (GLOVE) ×2 IMPLANT
GLOVE BIOGEL PI IND STRL 8 (GLOVE) ×2 IMPLANT
GLOVE BIOGEL PI IND STRL 9 (GLOVE) ×2 IMPLANT
GLOVE BIOGEL PI INDICATOR 6.5 (GLOVE) ×1
GLOVE BIOGEL PI INDICATOR 7.5 (GLOVE) ×1
GLOVE BIOGEL PI INDICATOR 8 (GLOVE) ×1
GLOVE BIOGEL PI INDICATOR 9 (GLOVE) ×1
GLOVE ORTHO TXT STRL SZ7.5 (GLOVE) ×3 IMPLANT
GOWN STRL REUS W/ TWL LRG LVL3 (GOWN DISPOSABLE) ×2 IMPLANT
GOWN STRL REUS W/ TWL XL LVL3 (GOWN DISPOSABLE) ×2 IMPLANT
GOWN STRL REUS W/TWL 2XL LVL3 (GOWN DISPOSABLE) ×3 IMPLANT
GOWN STRL REUS W/TWL LRG LVL3 (GOWN DISPOSABLE) ×1
GOWN STRL REUS W/TWL XL LVL3 (GOWN DISPOSABLE) ×1
NDL SAFETY ECLIPSE 18X1.5 (NEEDLE) ×2 IMPLANT
NEEDLE HYPO 18GX1.5 SHARP (NEEDLE) ×1
NEEDLE HYPO 22GX1.5 SAFETY (NEEDLE) IMPLANT
PACK BASIN DAY SURGERY FS (CUSTOM PROCEDURE TRAY) ×3 IMPLANT
PAD CAST 3X4 CTTN HI CHSV (CAST SUPPLIES) ×2 IMPLANT
PADDING CAST ABS 4INX4YD NS (CAST SUPPLIES) ×1
PADDING CAST ABS COTTON 4X4 ST (CAST SUPPLIES) ×2 IMPLANT
PADDING CAST COTTON 3X4 STRL (CAST SUPPLIES) ×1
PENCIL SMOKE EVACUATOR (MISCELLANEOUS) IMPLANT
SPLINT FIBERGLASS 3X35 (CAST SUPPLIES) ×3 IMPLANT
SPLINT FIBERGLASS 4X30 (CAST SUPPLIES) ×3 IMPLANT
SPLINT PLASTER CAST XFAST 3X15 (CAST SUPPLIES) IMPLANT
SPLINT PLASTER XTRA FASTSET 3X (CAST SUPPLIES)
STOCKINETTE 4X48 STRL (DRAPES) ×3 IMPLANT
STRIP CLOSURE SKIN 1/2X4 (GAUZE/BANDAGES/DRESSINGS) IMPLANT
SUT ETHILON 4 0 PS 2 18 (SUTURE) ×3 IMPLANT
SUT VIC AB 3-0 FS2 27 (SUTURE) IMPLANT
SYR 10ML LL (SYRINGE) ×3 IMPLANT
SYR BULB 3OZ (MISCELLANEOUS) IMPLANT
SYR CONTROL 10ML LL (SYRINGE) ×3 IMPLANT
TOWEL GREEN STERILE FF (TOWEL DISPOSABLE) ×3 IMPLANT
UNDERPAD 30X36 HEAVY ABSORB (UNDERPADS AND DIAPERS) ×3 IMPLANT

## 2019-08-11 NOTE — Anesthesia Preprocedure Evaluation (Addendum)
Anesthesia Evaluation  Patient identified by MRN, date of birth, ID band Patient awake    Reviewed: Allergy & Precautions, NPO status , Patient's Chart, lab work & pertinent test results  History of Anesthesia Complications (+) PONV and history of anesthetic complications  Airway Mallampati: II  TM Distance: >3 FB Neck ROM: Full    Dental no notable dental hx. (+) Teeth Intact, Dental Advisory Given   Pulmonary sleep apnea and Continuous Positive Airway Pressure Ventilation , former smoker,    Pulmonary exam normal breath sounds clear to auscultation       Cardiovascular negative cardio ROS Normal cardiovascular exam Rhythm:Regular Rate:Normal     Neuro/Psych  Headaches, PSYCHIATRIC DISORDERS Anxiety Depression    GI/Hepatic Neg liver ROS, hiatal hernia, GERD  Medicated and Controlled,  Endo/Other  diabetes, Type 2, Oral Hypoglycemic AgentsHypothyroidism   Renal/GU negative Renal ROS  negative genitourinary   Musculoskeletal  (+) Arthritis , Fibromyalgia -  Abdominal   Peds  Hematology negative hematology ROS (+)   Anesthesia Other Findings   Reproductive/Obstetrics                            Anesthesia Physical Anesthesia Plan  ASA: III  Anesthesia Plan: General   Post-op Pain Management:    Induction: Intravenous  PONV Risk Score and Plan: 4 or greater and Midazolam, Dexamethasone, Ondansetron and Scopolamine patch - Pre-op  Airway Management Planned: LMA  Additional Equipment:   Intra-op Plan:   Post-operative Plan: Extubation in OR  Informed Consent: I have reviewed the patients History and Physical, chart, labs and discussed the procedure including the risks, benefits and alternatives for the proposed anesthesia with the patient or authorized representative who has indicated his/her understanding and acceptance.     Dental advisory given  Plan Discussed with:  CRNA  Anesthesia Plan Comments:         Anesthesia Quick Evaluation

## 2019-08-11 NOTE — Brief Op Note (Signed)
08/11/2019  8:46 AM  PATIENT:  Chauncey Reading Mcgivern  58 y.o. female  PRE-OPERATIVE DIAGNOSIS:  left carpal tunnel syndrome, left long finger trigger finger  POST-OPERATIVE DIAGNOSIS:  left carpal tunnel syndrome, left long finger trigger finger  PROCEDURE:  Procedure(s): LEFT OPEN CARPAL TUNNEL RELEASE AND LEFT LONG FINGER TRIGGER FINGER RELEASE (Left) RELEASE TRIGGER FINGER/A-1 PULLEY (Left)  SURGEON:  Surgeon(s) and Role:    * Jessy Oto, MD - Primary  PHYSICIAN ASSISTANT: Esaw Grandchild  ANESTHESIA:   local and general, Dr. Hulan Fray  EBL:  10CC   BLOOD ADMINISTERED:none  DRAINS: none   LOCAL MEDICATIONS USED:  MARCAINE0.5% 1:1 EXPAREL 1.3% Amount: 10 ml  SPECIMEN:  No Specimen  DISPOSITION OF SPECIMEN:  N/A  COUNTS:  YES  TOURNIQUET:   Total Tourniquet Time Documented: Forearm (Left) - 16 minutes Total: Forearm (Left) - 16 minutes   DICTATION: .Viviann Spare Dictation  PLAN OF CARE: Discharge to home after PACU  PATIENT DISPOSITION:  PACU - hemodynamically stable.   Delay start of Pharmacological VTE agent (>24hrs) due to surgical blood loss or risk of bleeding: yes

## 2019-08-11 NOTE — Anesthesia Postprocedure Evaluation (Signed)
Anesthesia Post Note  Patient: Pammie Chirino Habel  Procedure(s) Performed: LEFT OPEN CARPAL TUNNEL RELEASE AND LEFT LONG FINGER TRIGGER FINGER RELEASE (Left Hand) RELEASE TRIGGER FINGER/A-1 PULLEY (Left Finger)     Patient location during evaluation: PACU Anesthesia Type: General Level of consciousness: awake and alert Pain management: pain level controlled Vital Signs Assessment: post-procedure vital signs reviewed and stable Respiratory status: spontaneous breathing, nonlabored ventilation, respiratory function stable and patient connected to nasal cannula oxygen Cardiovascular status: blood pressure returned to baseline and stable Postop Assessment: no apparent nausea or vomiting Anesthetic complications: no    Last Vitals:  Vitals:   08/11/19 1122 08/11/19 1126  BP:    Pulse: 94 91  Resp:    Temp:    SpO2: 96% 96%    Last Pain:  Vitals:   08/11/19 1130  TempSrc:   PainSc: 0-No pain                 Vera Furniss L Tyyonna Soucy

## 2019-08-11 NOTE — Transfer of Care (Signed)
Immediate Anesthesia Transfer of Care Note  Patient: Lateefah Mallery Rauh  Procedure(s) Performed: LEFT OPEN CARPAL TUNNEL RELEASE AND LEFT LONG FINGER TRIGGER FINGER RELEASE (Left Hand) RELEASE TRIGGER FINGER/A-1 PULLEY (Left Finger)  Patient Location: PACU  Anesthesia Type:General  Level of Consciousness: sedated and patient cooperative  Airway & Oxygen Therapy: Patient Spontanous Breathing and Patient connected to face mask oxygen  Post-op Assessment: Report given to RN and Post -op Vital signs reviewed and stable  Post vital signs: Reviewed and stable  Last Vitals:  Vitals Value Taken Time  BP 151/79 08/11/19 0902  Temp    Pulse 78 08/11/19 0904  Resp 15 08/11/19 0904  SpO2 94 % 08/11/19 0904  Vitals shown include unvalidated device data.  Last Pain:  Vitals:   08/11/19 0641  TempSrc: Oral  PainSc: 0-No pain         Complications: No apparent anesthesia complications

## 2019-08-11 NOTE — Anesthesia Procedure Notes (Signed)
Procedure Name: LMA Insertion Date/Time: 08/11/2019 7:48 AM Performed by: Marrianne Mood, CRNA Pre-anesthesia Checklist: Patient identified, Emergency Drugs available, Suction available, Patient being monitored and Timeout performed Patient Re-evaluated:Patient Re-evaluated prior to induction Oxygen Delivery Method: Circle system utilized Preoxygenation: Pre-oxygenation with 100% oxygen Induction Type: IV induction Ventilation: Mask ventilation without difficulty LMA: LMA inserted LMA Size: 4.0 Number of attempts: 1 Airway Equipment and Method: Bite block Placement Confirmation: positive ETCO2 Tube secured with: Tape Dental Injury: Teeth and Oropharynx as per pre-operative assessment

## 2019-08-11 NOTE — Discharge Instructions (Addendum)
Keep dressing dry. Elevated wrist above heart. No ice to the area of the surgery. After three day remove the dressing and apply a bandaid. May wet after 3 days with bandaid in place.  Return to office in ten days for removal of sutures left thumb. Call if any drainage, redness or worsening swelling.    Keep dressing dry. Elevated wrist above heart. Apply ice to palm side of wrist two hours on and one half hour off for 48 hours. May Apply ice at night and go to sleep with out changing. Be sure to keep ice off fingers to prevent frost bite.  Return to office in two weeks for removal of sutures left long finger trigger release site and left wrist.    Post Anesthesia Home Care Instructions  Activity: Get plenty of rest for the remainder of the day. A responsible individual must stay with you for 24 hours following the procedure.  For the next 24 hours, DO NOT: -Drive a car -Advertising copywriter -Drink alcoholic beverages -Take any medication unless instructed by your physician -Make any legal decisions or sign important papers.  Meals: Start with liquid foods such as gelatin or soup. Progress to regular foods as tolerated. Avoid greasy, spicy, heavy foods. If nausea and/or vomiting occur, drink only clear liquids until the nausea and/or vomiting subsides. Call your physician if vomiting continues.  Special Instructions/Symptoms: Your throat may feel dry or sore from the anesthesia or the breathing tube placed in your throat during surgery. If this causes discomfort, gargle with warm salt water. The discomfort should disappear within 24 hours.  If you had a scopolamine patch placed behind your ear for the management of post- operative nausea and/or vomiting:  1. The medication in the patch is effective for 72 hours, after which it should be removed.  Wrap patch in a tissue and discard in the trash. Wash hands thoroughly with soap and water. 2. You may remove the patch earlier than 72  hours if you experience unpleasant side effects which may include dry mouth, dizziness or visual disturbances. 3. Avoid touching the patch. Wash your hands with soap and water after contact with the patch.    Information for Discharge Teaching: EXPAREL (bupivacaine liposome injectable suspension)   Your surgeon or anesthesiologist gave you EXPAREL(bupivacaine) to help control your pain after surgery.   EXPAREL is a local anesthetic that provides pain relief by numbing the tissue around the surgical site.  EXPAREL is designed to release pain medication over time and can control pain for up to 72 hours.  Depending on how you respond to EXPAREL, you may require less pain medication during your recovery.  Possible side effects:  Temporary loss of sensation or ability to move in the area where bupivacaine was injected.  Nausea, vomiting, constipation  Rarely, numbness and tingling in your mouth or lips, lightheadedness, or anxiety may occur.  Call your doctor right away if you think you may be experiencing any of these sensations, or if you have other questions regarding possible side effects.  Follow all other discharge instructions given to you by your surgeon or nurse. Eat a healthy diet and drink plenty of water or other fluids.  If you return to the hospital for any reason within 96 hours following the administration of EXPAREL, it is important for health care providers to know that you have received this anesthetic. A teal colored band has been placed on your arm with the date, time and amount of EXPAREL  you have received in order to alert and inform your health care providers. Please leave this armband in place for the full 96 hours following administration, and then you may remove the band.Call your surgeon if you experience:   1.  Fever over 101.0. 2.  Inability to urinate. 3.  Nausea and/or vomiting. 4.  Extreme swelling or bruising at the surgical site. 5.  Continued bleeding  from the incision. 6.  Increased pain, redness or drainage from the incision. 7.  Problems related to your pain medication. 8.  Any problems and/or concerns

## 2019-08-11 NOTE — Interval H&P Note (Signed)
History and Physical Interval Note:  08/11/2019 7:40 AM  Holly [name]  has presented today for surgery, with the diagnosis of left carpal tunnel syndrome, left long finger trigger finger.  The various methods of treatment have been discussed with the patient and family. After consideration of risks, benefits and other options for treatment, the patient has consented to  Procedure(s): LEFT OPEN CARPAL TUNNEL RELEASE AND LEFT LONG FINGER TRIGGER FINGER RELEASE (Left) RELEASE TRIGGER FINGER/A-1 PULLEY (Left) as a surgical intervention.  The patient's history has been reviewed, patient examined, no change in status, stable for surgery.  I have reviewed the patient's chart and labs.  Questions were answered to the patient's satisfaction.     Basil Dess

## 2019-08-12 ENCOUNTER — Encounter (HOSPITAL_BASED_OUTPATIENT_CLINIC_OR_DEPARTMENT_OTHER): Payer: Self-pay | Admitting: Specialist

## 2019-08-21 ENCOUNTER — Ambulatory Visit (INDEPENDENT_AMBULATORY_CARE_PROVIDER_SITE_OTHER): Payer: BC Managed Care – PPO | Admitting: Specialist

## 2019-08-21 ENCOUNTER — Other Ambulatory Visit: Payer: Self-pay

## 2019-08-21 ENCOUNTER — Encounter: Payer: Self-pay | Admitting: Specialist

## 2019-08-21 ENCOUNTER — Telehealth: Payer: Self-pay | Admitting: Specialist

## 2019-08-21 VITALS — BP 122/78 | HR 73 | Ht 65.0 in | Wt 204.0 lb

## 2019-08-21 DIAGNOSIS — Z9889 Other specified postprocedural states: Secondary | ICD-10-CM

## 2019-08-21 NOTE — Patient Instructions (Addendum)
     Plan: Wear left wrist splint for 2 weeks. Keep the incision sites dry. Passive movement and active ROM of the fingers to  Prevent stiffness. Return to clinic in 10 days for evaluation of healing of the incision sites.

## 2019-08-21 NOTE — Telephone Encounter (Signed)
Patient needed a 4 week f/u from 11/20 with Dr. Louanne Skye.  He did not have anything available until December 30th.  Would you please place her on your cancellation list.  [phone].  Thank you.

## 2019-08-21 NOTE — Telephone Encounter (Signed)
She can see Holly [name] if necessary

## 2019-08-21 NOTE — Op Note (Signed)
08/11/2019  1:21 PM  PATIENT:  Holly [name]  58 y.o. female  MRN: [id number]  PRE-OPERATIVE DIAGNOSIS:  left carpal tunnel syndrome, left long finger trigger finger  POST-OPERATIVE DIAGNOSIS:  left carpal tunnel syndrome, left long finger trigger finger  PROCEDURE:  Procedure(s): LEFT OPEN CARPAL TUNNEL RELEASE AND LEFT LONG FINGER TRIGGER FINGER RELEASE RELEASE TRIGGER FINGER/A-1 PULLEY   OPERATIVE REPORT     SURGEON:  Jessy Oto, MD      ASSISTANT:  Benjiman Core, PA-C  (Present throughout the entire procedure and necessary for completion of procedure in a timely manner)      ANESTHESIA:  General anesthesia  Supplemented with local Marcaine 0.5 % 1:1 Exparel 4.0% 98JX     COMPLICATIONS:  None.      TOURNIQUET TIME: 16 minutes at  274mHg   PROCEDURE: The patient was met in the holding area, and the appropriate left wrist and left long finger identified and marked.   The patient was then transported to OR and was placed on the operative table in a supine position. The patient was then placed under general anesthesia without difficulty. The patient received appropriate preoperative antibiotic prophylaxis.     The left upper extremity was then prepped using sterile conditions and draped using sterile technique.  Time-out procedure was called and correct  .Using loope magnification and head lamp a 1.5 inch longitudinal incision curved at the left wrist crease with 15 blade Scalpel was made.  Incision through skin and subcutaneous tissue to the volar forearm fascia and  transverse carpal ligament. Fascia then carefully lifed and incised with Stevens scissors inline with the left fourth digit. The skin and subcutaneous tissue retracted and the volar fascia divided under direct vision from distal to proximal. A freer elevator then carefully placed between the median nerve and the transverse carpal ligament protecting the  median nerve as the transverse carpal ligament was divided  with a 15 blade scalpel in line with the fourth digit. Retracting the distal skin and subcutaneous tissues distally under direct visualization the remaining portions of the transverse carpal ligament were divided with tenotomy scissors again in line with the fourth digit. The palmar fascia was then divided until the traversing superficial palmar arch was identified and preserved intact.  The motor branch of the median nerve was carefully examined and identified Intact. Attention then turned to the left long finger trigger finger release. The left long finger volar distal palmar crease was infiltrated using a half percent Marcaine and 1.3% Exparel 50:50 total of 7 cc. A 2 cm transverse incision was then made over the left long MCP joint at the level of the distal palmar crease. Incision through skin and dermis only and subcutaneous layers spread in the midline and down to the flexor tendon sheath overlying the index MCP joint. A1 pulley identified.Double ended retractor was used on both sides retracting the digital nerves. A Stevens scissors then used to incise the flexor tendon sheath and the A1 pulley overlying the long MCP joint longitudinally incising through the thickened portion of the A1 pulley and dividing it until it was freed distally as well as proximally. With this then the tourniquet was released and the traction released. The tendons and shows some symmetric swelling with no significant flexor tendon swelling that would represent tendinous injury or previous old injury. Following irrigation and then the long finger trigger release incision was closed with 2 interrupted 4-0 nylon sutures in simple fashion and Dermabond applied. Dressing of Adaptic  4 x 4's fixed to the skin with 2x2s and sterile webril.   The carpal tunnel release incision was then irrigated with copious amounts of irrigant solution, No active bleeding was present. The incision closed with a single layer skin closure of  4-0 nylon horizontal mattress sutures and Dermabond applied. Dry dressing of adaptic, 4x4s held in place with sterile webril.  A well padded volar splint applied with ace wrap.  The patient reactivated and returned to the PACU in good condition.  All instruments and sponge counts were correct.    Owens, PA-C performed the duties of assistant surgeon during this procedure, performing retraction of delicate neural tissues, including  The areas about the left median nerve and the digital nerves during this procedure. Without adequate retraction and assistance this procedure can not Be performed safely.           08/21/2019, 1:21 PM  

## 2019-08-21 NOTE — Progress Notes (Signed)
Post-Op Visit Note   Patient: Holly [name]           Date of Birth: 15-Mar-1961           MRN: [id number] Visit Date: 08/21/2019 PCP: Patient, No Pcp Per   Assessment & Plan: 2 weeks post left CTR and left long finger trigger release  Chief Complaint:  Chief Complaint  Patient presents with  . Left Hand - Routine Post Op    Left Carpal Tunnel Release and Left Long Trigger Finger Release 10 days ago  Incision left long finger trigger release incision is healed Carpal tunnel incision is with some small amount of over lap of skin edges sutures are removed and steristrip applied. Visit Diagnoses: No diagnosis found.  Plan: Wear left wrist splint for 2 weeks. Keep the incision sites dry. Passive movement and active ROM of the fingers to  Prevent stiffness. Return to clinic in 10 days for evaluation of healing of the incision sites.    Follow-Up Instructions: No follow-ups on file.   Orders:  No orders of the defined types were placed in this encounter.  No orders of the defined types were placed in this encounter.   Imaging: No results found.  PMFS History: Patient Active Problem List   Diagnosis Date Noted  . Carpal tunnel syndrome, left upper limb 08/11/2019    Priority: High    Class: Chronic  . Trigger finger, acquired 09/05/2015    Priority: High    Class: Chronic  . Carpal tunnel syndrome, right 09/05/2015    Priority: Low    Class: Chronic  . DJD (degenerative joint disease), cervical 09/18/2016  . Other fatigue 09/18/2016  . Fibromyalgia 09/18/2016  . Primary osteoarthritis of both knees 09/18/2016  . Primary insomnia 09/18/2016  . Vitamin D deficiency 09/18/2016  . Pulmonary nodules 01/16/2013  . Dyspnea 01/13/2013   Past Medical History:  Diagnosis Date  . Anxiety    takes Ativan daily  . Back pain    stenosis and buldging disc  . Bone spur    neck and buldging disc  . Depression    takes Cymbalta daily  . Diabetes (HCC)    takes Metformin  daily  . Diverticulosis   . GERD (gastroesophageal reflux disease)    takes Prevacid daily  . History of bronchitis    > 5 yrs ago  . History of hiatal hernia   . Hypothyroidism    takes Synthroid daily  . IBS (irritable bowel syndrome)   . Insomnia    doesn't take any meds  . Joint pain   . Lung nodule    right middle lobe-was being followed by Dr.Burney.Medical Md is keeping up with this  . Migraine   . Migraine    last one 08/28/15  . Nocturia   . Numbness and tingling in hands   . PONV (postoperative nausea and vomiting)   . Sleep apnea   . Thoracic outlet syndrome   . TOS (thoracic outlet syndrome)   . Urinary urgency     Family History  Problem Relation Age of Onset  . COPD Mother   . Cancer Mother   . Heart disease Paternal Grandmother   . Heart disease Paternal Grandfather   . Heart disease Maternal Grandmother   . Heart disease Maternal Grandfather     Past Surgical History:  Procedure Laterality Date  . ABDOMINAL EXPLORATION SURGERY     cancer cells in cervix  . CARPAL TUNNEL RELEASE Right 09/05/2015  Procedure: RIGHT OPEN CARPAL TUNNEL RELEASE, RIGHT LONG FINGER TRIGGER RELEASE,RIGHT INDEX FINGER TRIGGER RELEASE;  Surgeon: Jessy Oto, MD;  Location: [place];  Service: Orthopedics;  Laterality: Right;  . CARPAL TUNNEL RELEASE Left 08/11/2019   Procedure: LEFT OPEN CARPAL TUNNEL RELEASE AND LEFT LONG FINGER TRIGGER FINGER RELEASE;  Surgeon: Jessy Oto, MD;  Location: [facility];  Service: Orthopedics;  Laterality: Left;  . CHOLECYSTECTOMY    . COLONOSCOPY    . ESOPHAGOGASTRODUODENOSCOPY    . THYROIDECTOMY    . TONSILLECTOMY     adenoidectomy  . TRIGGER FINGER RELEASE Right 09/05/2015   Procedure: RELEASE TRIGGER FINGER/A-1 PULLEY;  Surgeon: Jessy Oto, MD;  Location: [place];  Service: Orthopedics;  Laterality: Right;  . TRIGGER FINGER RELEASE Left 08/11/2019   Procedure: RELEASE TRIGGER FINGER/A-1 PULLEY;  Surgeon: Jessy Oto, MD;   Location: [place];  Service: Orthopedics;  Laterality: Left;   Social History   Occupational History  . Not on file  Tobacco Use  . Smoking status: Former Smoker    Types: Cigarettes  . Smokeless tobacco: Never Used  Substance and Sexual Activity  . Alcohol use: No  . Drug use: No  . Sexual activity: Yes    Birth control/protection: Post-menopausal

## 2019-08-24 ENCOUNTER — Telehealth: Payer: Self-pay

## 2019-08-24 NOTE — Telephone Encounter (Signed)
I worked patient in to be seen on 08/25/2019

## 2019-08-24 NOTE — Telephone Encounter (Signed)
Patient called stating that her steri strips have came off and that her skin looks bright white from where the strips were placed.  Patient was seen on Friday, 08/21/2019 for left carpal tunnel.  Stated that her incision looks like it is coming apart.  Cb# is [phone].  Please advise.  Thank you.

## 2019-08-25 ENCOUNTER — Ambulatory Visit (INDEPENDENT_AMBULATORY_CARE_PROVIDER_SITE_OTHER): Payer: BC Managed Care – PPO | Admitting: Specialist

## 2019-08-25 ENCOUNTER — Other Ambulatory Visit: Payer: Self-pay

## 2019-08-25 ENCOUNTER — Encounter: Payer: Self-pay | Admitting: Specialist

## 2019-08-25 VITALS — BP 149/84 | HR 61 | Ht 65.0 in | Wt 204.0 lb

## 2019-08-25 DIAGNOSIS — Z9889 Other specified postprocedural states: Secondary | ICD-10-CM

## 2019-08-25 NOTE — Progress Notes (Signed)
58 year old white female who is about 2 weeks status post left carpal tunnel release and left long trigger finger release returns for wound check.  Patient states that she wanted her incision looked at because she had some concerns about it not healing.  No complaints of fever chills.  No drainage from either incision.  Exam Left hand no signs infection.  No drainage from either wound.  Carpal tunnel incision small area of the incision looks like there has been slight wound separation.  This is very superficial.  Overall it is healing fine.  Plan Reassurance given.  I advised patient do not apply any creams or ointments to her wounds.  Still avoid soaking her hand in water.  It is okay for her to get her hand wet in the shower.  She will keep appointment as scheduled with Dr. Louanne Skye next week.  Return sooner if needed.

## 2019-09-01 ENCOUNTER — Ambulatory Visit: Payer: BC Managed Care – PPO

## 2019-09-04 ENCOUNTER — Encounter (INDEPENDENT_AMBULATORY_CARE_PROVIDER_SITE_OTHER): Payer: Self-pay

## 2019-09-04 ENCOUNTER — Encounter (INDEPENDENT_AMBULATORY_CARE_PROVIDER_SITE_OTHER): Payer: BC Managed Care – PPO | Admitting: Ophthalmology

## 2019-09-14 ENCOUNTER — Other Ambulatory Visit: Payer: Self-pay

## 2019-09-14 ENCOUNTER — Encounter: Payer: Self-pay | Admitting: Specialist

## 2019-09-14 ENCOUNTER — Ambulatory Visit: Payer: Self-pay

## 2019-09-14 ENCOUNTER — Ambulatory Visit (INDEPENDENT_AMBULATORY_CARE_PROVIDER_SITE_OTHER): Payer: BC Managed Care – PPO | Admitting: Specialist

## 2019-09-14 VITALS — BP 116/73 | HR 60 | Ht 65.0 in | Wt 204.0 lb

## 2019-09-14 DIAGNOSIS — M65332 Trigger finger, left middle finger: Secondary | ICD-10-CM

## 2019-09-14 DIAGNOSIS — M720 Palmar fascial fibromatosis [Dupuytren]: Secondary | ICD-10-CM

## 2019-09-14 DIAGNOSIS — G5602 Carpal tunnel syndrome, left upper limb: Secondary | ICD-10-CM

## 2019-09-14 MED ORDER — AMITRIPTYLINE HCL 10 MG PO TABS: 10.0000 mg | ORAL_TABLET | Freq: Every day | ORAL | 1 refills | Status: DC

## 2019-09-14 MED ORDER — IBUPROFEN 800 MG PO TABS: 800.0000 mg | ORAL_TABLET | Freq: Two times a day (BID) | ORAL | 1 refills | Status: DC

## 2019-09-14 NOTE — Progress Notes (Signed)
Post-Op Visit Note   Patient: Holly [name]           Date of Birth: 1961/02/06           MRN: [id number] Visit Date: 09/14/2019 PCP: Patient, No Pcp Per   Assessment & Plan: Left hand long finger trigger release, left hand CTR nearly 5 weeks post op with localized palmar fascia inflamation and left wrist pain and finger stiffness greater than expected.   Chief Complaint:  Chief Complaint  Patient presents with  . Left Hand - Edema, Routine Post Op  Left long finger with inability to fully extend with out pain. Tender to palpation over the left hand. No purlence or diffuse swelling.  Visit Diagnoses:  1. Trigger finger, left middle finger   2. Severe carpal tunnel syndrome, left   3. Palmar fasciitis     Plan: Physical Therapy for left hand intensive therapy with left hand ROM of fingers and wrist  antiinflamatory agents and low dose amitryptyline.   Follow-Up Instructions: Return in about 3 weeks (around 10/05/2019).   Orders:  Orders Placed This Encounter  Procedures  . XR Hand Complete Left  . Ambulatory referral to Physical Therapy   Meds ordered this encounter  Medications  . amitriptyline (ELAVIL) 10 MG tablet    Sig: Take 1 tablet (10 mg total) by mouth at bedtime.    Dispense:  30 tablet    Refill:  1  . ibuprofen (ADVIL) 800 MG tablet    Sig: Take 1 tablet (800 mg total) by mouth 2 (two) times daily after a meal.    Dispense:  60 tablet    Refill:  1    Imaging: No results found.  PMFS History: Patient Active Problem List   Diagnosis Date Noted  . Carpal tunnel syndrome, left upper limb 08/11/2019    Priority: High    Class: Chronic  . Trigger finger, left middle finger 09/05/2015    Priority: High    Class: Chronic  . Carpal tunnel syndrome, right 09/05/2015    Priority: Low    Class: Chronic  . DJD (degenerative joint disease), cervical 09/18/2016  . Other fatigue 09/18/2016  . Fibromyalgia 09/18/2016  . Primary osteoarthritis of both  knees 09/18/2016  . Primary insomnia 09/18/2016  . Vitamin D deficiency 09/18/2016  . Pulmonary nodules 01/16/2013  . Dyspnea 01/13/2013   Past Medical History:  Diagnosis Date  . Anxiety    takes Ativan daily  . Back pain    stenosis and buldging disc  . Bone spur    neck and buldging disc  . Depression    takes Cymbalta daily  . Diabetes ([place])    takes Metformin daily  . Diverticulosis   . GERD (gastroesophageal reflux disease)    takes Prevacid daily  . History of bronchitis    > 5 yrs ago  . History of hiatal hernia   . Hypothyroidism    takes Synthroid daily  . IBS (irritable bowel syndrome)   . Insomnia    doesn't take any meds  . Joint pain   . Lung nodule    right middle lobe-was being followed by Dr.Burney.Medical Md is keeping up with this  . Migraine   . Migraine    last one 08/28/15  . Nocturia   . Numbness and tingling in hands   . PONV (postoperative nausea and vomiting)   . Sleep apnea   . Thoracic outlet syndrome   . TOS (thoracic outlet syndrome)   .  Urinary urgency     Family History  Problem Relation Age of Onset  . COPD Mother   . Cancer Mother   . Heart disease Paternal Grandmother   . Heart disease Paternal Grandfather   . Heart disease Maternal Grandmother   . Heart disease Maternal Grandfather     Past Surgical History:  Procedure Laterality Date  . ABDOMINAL EXPLORATION SURGERY     cancer cells in cervix  . CARPAL TUNNEL RELEASE Right 09/05/2015   Procedure: RIGHT OPEN CARPAL TUNNEL RELEASE, RIGHT LONG FINGER TRIGGER RELEASE,RIGHT INDEX FINGER TRIGGER RELEASE;  Surgeon: Kerrin Champagne, MD;  Location: [facility];  Service: Orthopedics;  Laterality: Right;  . CARPAL TUNNEL RELEASE Left 08/11/2019   Procedure: LEFT OPEN CARPAL TUNNEL RELEASE AND LEFT LONG FINGER TRIGGER FINGER RELEASE;  Surgeon: Kerrin Champagne, MD;  Location: [facility];  Service: Orthopedics;  Laterality: Left;  . CHOLECYSTECTOMY    . COLONOSCOPY    .  ESOPHAGOGASTRODUODENOSCOPY    . THYROIDECTOMY    . TONSILLECTOMY     adenoidectomy  . TRIGGER FINGER RELEASE Right 09/05/2015   Procedure: RELEASE TRIGGER FINGER/A-1 PULLEY;  Surgeon: Kerrin Champagne, MD;  Location: [facility];  Service: Orthopedics;  Laterality: Right;  . TRIGGER FINGER RELEASE Left 08/11/2019   Procedure: RELEASE TRIGGER FINGER/A-1 PULLEY;  Surgeon: Kerrin Champagne, MD;  Location: [facility];  Service: Orthopedics;  Laterality: Left;   Social History   Occupational History  . Not on file  Tobacco Use  . Smoking status: Former Smoker    Types: Cigarettes  . Smokeless tobacco: Never Used  Substance and Sexual Activity  . Alcohol use: No  . Drug use: No  . Sexual activity: Yes    Birth control/protection: Post-menopausal

## 2019-09-14 NOTE — Patient Instructions (Signed)
Carpal Tunnel Syndrome  Carpal tunnel syndrome is a condition that causes pain in your hand and arm. The carpal tunnel is a narrow area located on the palm side of your wrist. Repeated wrist motion or certain diseases may cause swelling within the tunnel. This swelling pinches the main nerve in the wrist (median nerve). What are the causes? This condition may be caused by:  Repeated wrist motions.  Wrist injuries.  Arthritis.  A cyst or tumor in the carpal tunnel.  Fluid buildup during pregnancy. Sometimes the cause of this condition is not known. What increases the risk? This condition is more likely to develop in:  People who have jobs that cause them to repeatedly move their wrists in the same motion, such as butchers and cashiers.  Women.  People with certain conditions, such as: ? Diabetes. ? Obesity. ? An underactive thyroid (hypothyroidism). ? Kidney failure. What are the signs or symptoms? Symptoms of this condition include:  A tingling feeling in your fingers, especially in your thumb, index, and middle fingers.  Tingling or numbness in your hand.  An aching feeling in your entire arm, especially when your wrist and elbow are bent for long periods of time.  Wrist pain that goes up your arm to your shoulder.  Pain that goes down into your palm or fingers.  A weak feeling in your hands. You may have trouble grabbing and holding items. Your symptoms may feel worse during the night. How is this diagnosed? This condition is diagnosed with a medical history and physical exam. You may also have tests, including:  An electromyogram (EMG). This test measures electrical signals sent by your nerves into the muscles.  X-rays. How is this treated? Treatment for this condition includes:  Lifestyle changes. It is important to stop doing or modify the activity that caused your condition.  Physical or occupational therapy.  Medicines for pain and inflammation. This may  include medicine that is injected into your wrist.  A wrist splint.  Surgery. Follow these instructions at home: If you have a splint:   Wear it as told by your health care provider. Remove it only as told by your health care provider.  Loosen the splint if your fingers become numb and tingle, or if they turn cold and blue.  Keep the splint clean and dry. General instructions   Take over-the-counter and prescription medicines only as told by your health care provider.  Rest your wrist from any activity that may be causing your pain. If your condition is work related, talk to your employer about changes that can be made, such as getting a wrist pad to use while typing.  If directed, apply ice to the painful area: ? Put ice in a plastic bag. ? Place a towel between your skin and the bag. ? Leave the ice on for 20 minutes, 2-3 times per day.  Keep all follow-up visits as told by your health care provider. This is important.  Do any exercises as told by your health care provider, physical therapist, or occupational therapist. Contact a health care provider if:  You have new symptoms.  Your pain is not controlled with medicines.  Your symptoms get worse. This information is not intended to replace advice given to you by your health care provider. Make sure you discuss any questions you have with your health care provider. Document Released: 09/14/2000 Document Revised: 01/26/2016 Document Reviewed: 05/29/2017 Elsevier Interactive Patient Education  2017 [employer] 

## 2019-09-15 ENCOUNTER — Telehealth: Payer: Self-pay | Admitting: Specialist

## 2019-09-15 NOTE — Telephone Encounter (Signed)
Pt called in upset said her order for physical therapy was sent to the wrong place again pt said she want's to have her physical therapy here at our location. Please give her a call   [phone]

## 2019-09-15 NOTE — Telephone Encounter (Signed)
I called and spoke with Varney Biles that schedules for our PT dept, she states that they are booked out till 10/12/2019, so she sent them to [facility], I called patient and she states that per Dr. Louanne Skye they are not that busy here, and I advised that they are busy with patients, and advised that the first available appt would be 10/12/2019 and she can do that or be referred to [facility], she did ok to be referred to [place] for PT

## 2019-09-16 ENCOUNTER — Ambulatory Visit: Payer: BC Managed Care – PPO | Admitting: Physical Therapy

## 2019-09-16 ENCOUNTER — Ambulatory Visit: Payer: BC Managed Care – PPO

## 2019-09-23 ENCOUNTER — Ambulatory Visit: Payer: BC Managed Care – PPO | Admitting: Surgery

## 2019-09-23 ENCOUNTER — Ambulatory Visit: Payer: BC Managed Care – PPO | Admitting: Physical Therapy

## 2019-09-29 DIAGNOSIS — F419 Anxiety disorder, unspecified: Secondary | ICD-10-CM | POA: Diagnosis not present

## 2019-09-29 DIAGNOSIS — G43719 Chronic migraine without aura, intractable, without status migrainosus: Secondary | ICD-10-CM | POA: Diagnosis not present

## 2019-09-29 DIAGNOSIS — M797 Fibromyalgia: Secondary | ICD-10-CM | POA: Diagnosis not present

## 2019-09-29 DIAGNOSIS — M542 Cervicalgia: Secondary | ICD-10-CM | POA: Diagnosis not present

## 2019-09-30 ENCOUNTER — Ambulatory Visit: Payer: BC Managed Care – PPO | Admitting: Specialist

## 2019-10-05 ENCOUNTER — Encounter (INDEPENDENT_AMBULATORY_CARE_PROVIDER_SITE_OTHER): Payer: BC Managed Care – PPO | Admitting: Ophthalmology

## 2019-10-05 ENCOUNTER — Other Ambulatory Visit: Payer: Self-pay

## 2019-10-05 DIAGNOSIS — E113293 Type 2 diabetes mellitus with mild nonproliferative diabetic retinopathy without macular edema, bilateral: Secondary | ICD-10-CM | POA: Diagnosis not present

## 2019-10-05 DIAGNOSIS — H2513 Age-related nuclear cataract, bilateral: Secondary | ICD-10-CM

## 2019-10-05 DIAGNOSIS — E11319 Type 2 diabetes mellitus with unspecified diabetic retinopathy without macular edema: Secondary | ICD-10-CM | POA: Diagnosis not present

## 2019-10-05 DIAGNOSIS — H43813 Vitreous degeneration, bilateral: Secondary | ICD-10-CM

## 2019-10-06 ENCOUNTER — Ambulatory Visit: Payer: BC Managed Care – PPO | Attending: Specialist | Admitting: Physical Therapy

## 2019-10-06 ENCOUNTER — Encounter: Payer: Self-pay | Admitting: Physical Therapy

## 2019-10-06 DIAGNOSIS — M25642 Stiffness of left hand, not elsewhere classified: Secondary | ICD-10-CM | POA: Diagnosis not present

## 2019-10-06 DIAGNOSIS — M25632 Stiffness of left wrist, not elsewhere classified: Secondary | ICD-10-CM | POA: Insufficient documentation

## 2019-10-06 DIAGNOSIS — M79642 Pain in left hand: Secondary | ICD-10-CM | POA: Diagnosis not present

## 2019-10-06 NOTE — Patient Instructions (Signed)
Access Code: [email or web address]  URL: [email or web address]  Date: 10/06/2019  Prepared by: Donita Brooks    Exercises Hand AROM Tendon Gliding Series- 5 reps- 5 hold- 3x daily- 7x weekly  Towel Roll Squeeze- 10 reps- 3 sets- 1x daily- 7x weekly    [facility] [address], [address] [place], [place] [address] Phone # [phone] Fax [phone]

## 2019-10-06 NOTE — Therapy (Signed)
[facility] [address] [place], [place], [address] Phone: [phone]   Fax:  [phone]  Physical Therapy Evaluation  Patient Details  Name: Holly [name] MRN: [id number] Date of Birth: 12-29-1960 Referring Provider (PT): Vira Browns, MD   Encounter Date: 10/06/2019  PT End of Session - 10/06/19 1450    Visit Number  1    Date for PT Re-Evaluation  11/20/19    Authorization Type  [employer] PPO    Authorization Time Period  10/06/19 to 11/20/19    PT Start Time  1400    PT Stop Time  1445    PT Time Calculation (min)  45 min    Activity Tolerance  Patient tolerated treatment well;No increased pain    Behavior During Therapy  WFL for tasks assessed/performed       Past Medical History:  Diagnosis Date  . Anxiety    takes Ativan daily  . Back pain    stenosis and buldging disc  . Bone spur    neck and buldging disc  . Depression    takes Cymbalta daily  . Diabetes (HCC)    takes Metformin daily  . Diverticulosis   . GERD (gastroesophageal reflux disease)    takes Prevacid daily  . History of bronchitis    > 5 yrs ago  . History of hiatal hernia   . Hypothyroidism    takes Synthroid daily  . IBS (irritable bowel syndrome)   . Insomnia    doesn't take any meds  . Joint pain   . Lung nodule    right middle lobe-was being followed by Dr.Burney.Medical Md is keeping up with this  . Migraine   . Migraine    last one 08/28/15  . Nocturia   . Numbness and tingling in hands   . PONV (postoperative nausea and vomiting)   . Sleep apnea   . Thoracic outlet syndrome   . TOS (thoracic outlet syndrome)   . Urinary urgency     Past Surgical History:  Procedure Laterality Date  . ABDOMINAL EXPLORATION SURGERY     cancer cells in cervix  . CARPAL TUNNEL RELEASE Right 09/05/2015   Procedure: RIGHT OPEN CARPAL TUNNEL RELEASE, RIGHT LONG FINGER TRIGGER RELEASE,RIGHT INDEX FINGER TRIGGER RELEASE;  Surgeon: Kerrin Champagne, MD;  [facility];  Service: Orthopedics;  Laterality: Right;  . CARPAL TUNNEL RELEASE Left 08/11/2019   Procedure: LEFT OPEN CARPAL TUNNEL RELEASE AND LEFT LONG FINGER TRIGGER FINGER RELEASE;  Surgeon: Kerrin Champagne, MD;  Location: [facility];  Service: Orthopedics;  Laterality: Left;  . CHOLECYSTECTOMY    . COLONOSCOPY    . ESOPHAGOGASTRODUODENOSCOPY    . THYROIDECTOMY    . TONSILLECTOMY     adenoidectomy  . TRIGGER FINGER RELEASE Right 09/05/2015   Procedure: RELEASE TRIGGER FINGER/A-1 PULLEY;  Surgeon: Kerrin Champagne, MD;  [facility];  Service: Orthopedics;  Laterality: Right;  . TRIGGER FINGER RELEASE Left 08/11/2019   Procedure: RELEASE TRIGGER FINGER/A-1 PULLEY;  Surgeon: Kerrin Champagne, MD;  Location: [facility];  Service: Orthopedics;  Laterality: Left;    There were no vitals filed for this visit.   Subjective Assessment - 10/06/19 1408    Subjective  Pt states that she had carpal tunnel release and trigger finger release on 08/11/19. Mostly, the hand feels stiff and weak, but pt notes that sometimes there will be sharp/shooting pain in the hand.  Pertinent History  diabetes; Rt trigger finger/carpal tunnel release 09/05/15    Limitations  House hold activities    Patient Stated Goals  improve function of the Lt hand    Currently in Pain?  No/denies         [facility] PT Assessment - 10/06/19 0001      Assessment   Medical Diagnosis  Lt trigger finger/carpal tunnel release    Referring Provider (PT)  Vira Browns, MD    Onset Date/Surgical Date  --   08/11/19   Hand Dominance  Right   uses Lt very often    Next MD Visit  10/14/19    Prior Therapy  none following this       Precautions   Precautions  None      Balance Screen   Has the patient fallen in the past 6 months  No    Has the patient had a decrease in activity level because of a fear of falling?   No    Is the patient reluctant to leave their home because of a  fear of falling?   No      Prior Function   Level of Independence  Independent      Cognition   Overall Cognitive Status  Within Functional Limits for tasks assessed      Observation/Other Assessments   Observations  scar adhesions along both surgicial incisions      ROM / Strength   AROM / PROM / Strength  AROM;Strength      AROM   Overall AROM Comments  Lt forearm supination:75 deg, pronation: 80 deg; Lt finger extension 5 deg (+) pain     AROM Assessment Site  Finger;Wrist    Right/Left Wrist  Left    Left Wrist Extension  50 Degrees    Left Wrist Flexion  35 Degrees    Right/Left Finger  Left    Left Composite Finger Extension  50%      Strength   Strength Assessment Site  Hand    Right/Left hand  Left;Right    Right Hand Grip (lbs)  70.6    Right Hand Lateral Pinch  14 lbs    Left Hand Grip (lbs)  25    Left Hand Lateral Pinch  12 lbs                Objective measurements completed on examination: See above findings.      [facility] Adult PT Treatment/Exercise - 10/06/19 0001      Self-Care   Self-Care  Scar Mobilizations    Scar Mobilizations  scar mobilization demo, heat 5-10 min prior, 3x day      Exercises   Exercises  Hand      Hand Exercises   Tendon Glides  Lt hand demonstration for HEP      Manual Therapy   Manual Therapy  Soft tissue mobilization    Soft tissue mobilization  Scar mobilization to both surgical incisions             PT Education - 10/06/19 1512    Education Details  eval findings/POC; implemented HEP and reviewed scar massage    Person(s) Educated  Patient    Methods  Explanation;Handout;Demonstration    Comprehension  Verbalized understanding;Returned demonstration       PT Short Term Goals - 10/06/19 1522      PT SHORT TERM GOAL #1   Title  Pt will demo consistency and independence with her initial HEP  to decrease pain and strength in the Lt hand.    Time  2    Period  Weeks    Status  New    Target Date   10/20/19        PT Long Term Goals - 10/06/19 1524      PT LONG TERM GOAL #1   Title  Pt will have greater than 60 deg of active Lt wrist extension and flexion to improve efficiency with daily tasks.    Time  6    Period  Weeks    Status  New    Target Date  11/20/19      PT LONG TERM GOAL #2   Title  Pt will have atleast 15lb improvement in her Lt grip strength to increase her ability to use to Lt UE throughout the day.    Time  6    Period  Weeks    Status  New      PT LONG TERM GOAL #3   Title  Pt will have atleast 20 deg of active Lt finger extension without increase in pain.    Time  6    Period  Weeks    Status  New      PT LONG TERM GOAL #4   Title  Pt will report atleast 60% improvement in function and strength of the Lt hand from the start of PT.    Time  6    Period  Weeks    Status  New             Plan - 10/06/19 1512    Clinical Impression Statement  Pt is a pleasant 59 yo F referred to OPPT with complaints of Lt hand/finger stiffness, pain and weakness ongoing since a Lt carpal tunnel release on 08/11/19. She has been trying to manage her symptoms independently without much success. She presents today with significant limitations in wrist ROM (35 deg flexion, 50 deg extension), as well as limitations in finger extension and flexion. Active and passive DIP/PIP extension are painful. Pt's grip strength is 25lb on the Lt compared to 70lb on the Rt. In addition, pt has scar adhesions along both surgical incisions. She would benefit from skilled PT to address her scar mobility, improve wrist/finger ROM, strength and increased her function of the Lt UE.    Personal Factors and Comorbidities  Age;Time since onset of injury/illness/exacerbation;Comorbidity 1    Comorbidities  diabetes    Examination-Activity Limitations  Dressing;Carry    Stability/Clinical Decision Making  Stable/Uncomplicated    Clinical Decision Making  Low    Rehab Potential  Good    PT  Frequency  2x / week    PT Duration  6 weeks    PT Treatment/Interventions  ADLs/Self Care Home Management;Cryotherapy;Moist Heat;Neuromuscular re-education;Therapeutic exercise;Therapeutic activities;Patient/family education;Dry needling;Manual techniques;Passive range of motion;Taping    PT Next Visit Plan  wrist mobilizations; finger tendon glides; progressions of hand strength/ROM    PT Home Exercise Plan  Access Code: [email or web address] URL: [email or web address] Date: 10/06/2019 Prepared by: Donita Brooks  Exercises Hand AROM Tendon Gliding Series- 5 reps- 5 hold- 3x daily- 7x weekly Towel Roll Squeeze- 10 reps- 3 sets- 1x daily- 7x weekly    Consulted and Agree with Plan of Care  Patient       Patient will benefit from skilled therapeutic intervention in order to improve the following deficits and impairments:  Pain, Decreased scar mobility, Hypomobility, Decreased strength, Decreased range of motion, Impaired flexibility  Visit Diagnosis: Stiffness of left hand, not elsewhere classified  Stiffness of left wrist, not elsewhere classified  Pain in left hand     Problem List Patient Active Problem List   Diagnosis Date Noted  . Carpal tunnel syndrome, left upper limb 08/11/2019    Class: Chronic  . DJD (degenerative joint disease), cervical 09/18/2016  . Other fatigue 09/18/2016  . Fibromyalgia 09/18/2016  . Primary osteoarthritis of both knees 09/18/2016  . Primary insomnia 09/18/2016  . Vitamin D deficiency 09/18/2016  . Carpal tunnel syndrome, right 09/05/2015    Class: Chronic  . Trigger finger, left middle finger 09/05/2015    Class: Chronic  . Pulmonary nodules 01/16/2013  . Dyspnea 01/13/2013    4:54 PM,10/06/19 [place], DPT [place] [facility] [address] [place], [place], [address] Phone: [phone]   Fax:   [phone]  Name: Holly [name] MRN: [id number] Date of Birth: [birth year]

## 2019-10-08 ENCOUNTER — Other Ambulatory Visit: Payer: Self-pay | Admitting: Specialist

## 2019-10-08 ENCOUNTER — Ambulatory Visit: Payer: BC Managed Care – PPO | Admitting: Specialist

## 2019-10-09 ENCOUNTER — Other Ambulatory Visit: Payer: Self-pay | Admitting: Specialist

## 2019-10-09 ENCOUNTER — Ambulatory Visit: Payer: BC Managed Care – PPO | Admitting: Physical Therapy

## 2019-10-13 ENCOUNTER — Other Ambulatory Visit: Payer: Self-pay

## 2019-10-13 ENCOUNTER — Encounter: Payer: Self-pay | Admitting: Physical Therapy

## 2019-10-13 ENCOUNTER — Ambulatory Visit: Payer: BC Managed Care – PPO | Admitting: Physical Therapy

## 2019-10-13 DIAGNOSIS — M79642 Pain in left hand: Secondary | ICD-10-CM | POA: Diagnosis not present

## 2019-10-13 DIAGNOSIS — M25632 Stiffness of left wrist, not elsewhere classified: Secondary | ICD-10-CM | POA: Diagnosis not present

## 2019-10-13 DIAGNOSIS — M25642 Stiffness of left hand, not elsewhere classified: Secondary | ICD-10-CM

## 2019-10-13 NOTE — Patient Instructions (Signed)
Access Code: [email or web address]  URL: [email or web address]  Date: 10/13/2019  Prepared by: Donita Brooks    Exercises Hand AROM Tendon Gliding Series- 5 reps- 5 hold- 3x daily- 7x weekly  Towel Roll Squeeze- 10 reps- 3 sets- 1x daily- 7x weekly  Seated Finger PIP PROM- 10 hold- 3-4x daily- 7x weekly  Seated Wrist Flexion Stretch- 3-4x daily- 7x weekly  Seated Wrist Extension Stretch- 10-20 hold- 3-4x daily- 7x weekly    [facility] [address], [address] [place], [place] [address] Phone # [phone] Fax [phone]

## 2019-10-13 NOTE — Therapy (Signed)
[facility] [address] [place], [place], [address] Phone: [phone]   Fax:  [phone]  Physical Therapy Treatment  Patient Details  Name: Holly [name] MRN: [id number] Date of Birth: [birth year] Referring Provider (PT): Vira Browns, MD   Encounter Date: 10/13/2019  PT End of Session - 10/13/19 1318    Visit Number  2    Date for PT Re-Evaluation  11/20/19    Authorization Type  BCBS PPO    Authorization Time Period  10/06/19 to 11/20/19    PT Start Time  1153    PT Stop Time  1231    PT Time Calculation (min)  38 min    Activity Tolerance  Patient tolerated treatment well;No increased pain    Behavior During Therapy  WFL for tasks assessed/performed       Past Medical History:  Diagnosis Date  . Anxiety    takes Ativan daily  . Back pain    stenosis and buldging disc  . Bone spur    neck and buldging disc  . Depression    takes Cymbalta daily  . Diabetes (HCC)    takes Metformin daily  . Diverticulosis   . GERD (gastroesophageal reflux disease)    takes Prevacid daily  . History of bronchitis    > 5 yrs ago  . History of hiatal hernia   . Hypothyroidism    takes Synthroid daily  . IBS (irritable bowel syndrome)   . Insomnia    doesn't take any meds  . Joint pain   . Lung nodule    right middle lobe-was being followed by Dr.Burney.Medical Md is keeping up with this  . Migraine   . Migraine    last one 08/28/15  . Nocturia   . Numbness and tingling in hands   . PONV (postoperative nausea and vomiting)   . Sleep apnea   . Thoracic outlet syndrome   . TOS (thoracic outlet syndrome)   . Urinary urgency     Past Surgical History:  Procedure Laterality Date  . ABDOMINAL EXPLORATION SURGERY     cancer cells in cervix  . CARPAL TUNNEL RELEASE Right 09/05/2015   Procedure: RIGHT OPEN CARPAL TUNNEL RELEASE, RIGHT LONG FINGER TRIGGER RELEASE,RIGHT INDEX FINGER TRIGGER RELEASE;  Surgeon: Kerrin Champagne, MD;  [facility];  Service: Orthopedics;  Laterality: Right;  . CARPAL TUNNEL RELEASE Left 08/11/2019   Procedure: LEFT OPEN CARPAL TUNNEL RELEASE AND LEFT LONG FINGER TRIGGER FINGER RELEASE;  Surgeon: Kerrin Champagne, MD;  Location: [facility];  Service: Orthopedics;  Laterality: Left;  . CHOLECYSTECTOMY    . COLONOSCOPY    . ESOPHAGOGASTRODUODENOSCOPY    . THYROIDECTOMY    . TONSILLECTOMY     adenoidectomy  . TRIGGER FINGER RELEASE Right 09/05/2015   Procedure: RELEASE TRIGGER FINGER/A-1 PULLEY;  Surgeon: Kerrin Champagne, MD;  [facility];  Service: Orthopedics;  Laterality: Right;  . TRIGGER FINGER RELEASE Left 08/11/2019   Procedure: RELEASE TRIGGER FINGER/A-1 PULLEY;  Surgeon: Kerrin Champagne, MD;  Location: [facility];  Service: Orthopedics;  Laterality: Left;    There were no vitals filed for this visit.  Subjective Assessment - 10/13/19 1323    Subjective  Pt has been working on massage and stretching at home. She is still having alot of soreness/stiffness in the mornings.    Pertinent History  diabetes; Rt trigger finger/carpal tunnel release 09/05/15    Limitations  House hold activities    Patient Stated Goals  improve function of the Lt hand    Currently in Pain?  No/denies                       [facility] Adult PT Treatment/Exercise - 10/13/19 0001      Hand Exercises   Other Hand Exercises  Lt wrist flexion/extension x10 sec for HEP demo      Manual Therapy   Manual Therapy  Joint mobilization    Manual therapy comments  passive Lt 3rd digit extension at PIP with DIP extended x5 reps     Joint Mobilization  Lt wrist AP and PA mobilizations grade III-IV x3 bouts each direciton to increase flexion and extension; Lt distal AP/PA radioulnar mobilization x2 bouts each; Lt 3rd digit DIP mobilization grade III x2 bouts AP direction    Soft tissue mobilization  Scar mobilization to both surgical incisions; scar mobilization  with passive Lt 3rd digit extension, Scar mobilization/trapping with Lt thumb passive extension; STM Lt wrist flexors/extensors             PT Education - 10/13/19 1317    Education Details  updates to HEP; adjusting intensity of massage at home if having increased soreness    Person(s) Educated  Patient    Methods  Explanation;Handout;Verbal cues    Comprehension  Verbalized understanding;Returned demonstration       PT Short Term Goals - 10/06/19 1522      PT SHORT TERM GOAL #1   Title  Pt will demo consistency and independence with her initial HEP to decrease pain and strength in the Lt hand.    Time  2    Period  Weeks    Status  New    Target Date  10/20/19        PT Long Term Goals - 10/06/19 1524      PT LONG TERM GOAL #1   Title  Pt will have greater than 60 deg of active Lt wrist extension and flexion to improve efficiency with daily tasks.    Time  6    Period  Weeks    Status  New    Target Date  11/20/19      PT LONG TERM GOAL #2   Title  Pt will have atleast 15lb improvement in her Lt grip strength to increase her ability to use to Lt UE throughout the day.    Time  6    Period  Weeks    Status  New      PT LONG TERM GOAL #3   Title  Pt will have atleast 20 deg of active Lt finger extension without increase in pain.    Time  6    Period  Weeks    Status  New      PT LONG TERM GOAL #4   Title  Pt will report atleast 60% improvement in function and strength of the Lt hand from the start of PT.    Time  6    Period  Weeks    Status  New            Plan - 10/13/19 1319    Clinical Impression Statement  Pt had concerns of increased Lt hand soreness/stiffness in the mornings despite completing her HEP. PT reviewed expectations for self-massage and adjusting intensity/length throughout the day to avoid this in the future. Most of the session focused on manual treatment  to improve wrist and hand mobility, including both soft tissue and joint  mobilizations. End of session, pt had 3rd digit extension 5 deg beyond neutral without pain and reported improved ability to close her fist. She demonstrated understanding of HEP updates.    Personal Factors and Comorbidities  Age;Time since onset of injury/illness/exacerbation;Comorbidity 1    Comorbidities  diabetes    Examination-Activity Limitations  Dressing;Carry    Stability/Clinical Decision Making  Stable/Uncomplicated    Rehab Potential  Good    PT Frequency  2x / week    PT Duration  6 weeks    PT Treatment/Interventions  ADLs/Self Care Home Management;Cryotherapy;Moist Heat;Neuromuscular re-education;Therapeutic exercise;Therapeutic activities;Patient/family education;Dry needling;Manual techniques;Passive range of motion;Taping    PT Next Visit Plan  f/u on HEP; wrist mobilizations; finger tendon glides; progressions of hand strength/ROM    PT Home Exercise Plan  Access Code: [email or web address] URL: [email or web address] Date: 10/06/2019 Prepared by: Donita Brooks  Exercises Hand AROM Tendon Gliding Series- 5 reps- 5 hold- 3x daily- 7x weekly Towel Roll Squeeze- 10 reps- 3 sets- 1x daily- 7x weekly    Consulted and Agree with Plan of Care  Patient       Patient will benefit from skilled therapeutic intervention in order to improve the following deficits and impairments:  Pain, Decreased scar mobility, Hypomobility, Decreased strength, Decreased range of motion, Impaired flexibility  Visit Diagnosis: Stiffness of left hand, not elsewhere classified  Stiffness of left wrist, not elsewhere classified  Pain in left hand     Problem List Patient Active Problem List   Diagnosis Date Noted  . Carpal tunnel syndrome, left upper limb 08/11/2019    Class: Chronic  . DJD (degenerative joint disease), cervical 09/18/2016  . Other fatigue 09/18/2016  . Fibromyalgia 09/18/2016  . Primary osteoarthritis of both knees 09/18/2016  . Primary insomnia 09/18/2016  . Vitamin D  deficiency 09/18/2016  . Carpal tunnel syndrome, right 09/05/2015    Class: Chronic  . Trigger finger, left middle finger 09/05/2015    Class: Chronic  . Pulmonary nodules 01/16/2013  . Dyspnea 01/13/2013    1:23 PM,10/13/19 Donita Brooks PT, DPT [facility] [place]  [phone]  [facility] [address] [place], [place], [address] Phone: [phone]   Fax:  [phone]  Name: Holly [name] MRN: [id number] Date of Birth: 1961-01-28

## 2019-10-14 ENCOUNTER — Encounter: Payer: Self-pay | Admitting: Specialist

## 2019-10-14 ENCOUNTER — Ambulatory Visit (INDEPENDENT_AMBULATORY_CARE_PROVIDER_SITE_OTHER): Payer: BC Managed Care – PPO | Admitting: Specialist

## 2019-10-14 VITALS — BP 131/69 | HR 74 | Ht 65.0 in | Wt 209.0 lb

## 2019-10-14 DIAGNOSIS — Z9889 Other specified postprocedural states: Secondary | ICD-10-CM

## 2019-10-14 DIAGNOSIS — M65332 Trigger finger, left middle finger: Secondary | ICD-10-CM

## 2019-10-14 DIAGNOSIS — G4733 Obstructive sleep apnea (adult) (pediatric): Secondary | ICD-10-CM | POA: Diagnosis not present

## 2019-10-14 NOTE — Progress Notes (Signed)
Post-Op Visit Note   Patient: Holly [name]           Date of Birth: 1961/04/06           MRN: [id number] Visit Date: 10/14/2019 PCP: Josetta Huddle, MD   Assessment & Plan:8 weeks post left carpal tunnel release and long finger trigger release.  Chief Complaint:  Chief Complaint  Patient presents with  . Left Hand - Routine Post Op  Incisions are healed with some erythrema. Using voltaren gel There is no numbness. There is tenderness about the volar left long finger A1 pulley release incision. ROM is improving. Has therapy scheduled through 11/05/2019 Visit Diagnoses: No diagnosis found.  Plan: Carpal Tunnel Syndrome  Carpal tunnel syndrome is a condition that causes pain in your hand and arm. The carpal tunnel is a narrow area located on the palm side of your wrist. Repeated wrist motion or certain diseases may cause swelling within the tunnel. This swelling pinches the main nerve in the wrist (median nerve). What are the causes? This condition may be caused by:  Repeated wrist motions.  Wrist injuries.  Arthritis.  A cyst or tumor in the carpal tunnel.  Fluid buildup during pregnancy. Sometimes the cause of this condition is not known. What increases the risk? This condition is more likely to develop in:  People who have jobs that cause them to repeatedly move their wrists in the same motion, such as Art gallery manager.  Women.  People with certain conditions, such as: ? Diabetes. ? Obesity. ? An underactive thyroid (hypothyroidism). ? Kidney failure. What are the signs or symptoms? Symptoms of this condition include:  A tingling feeling in your fingers, especially in your thumb, index, and middle fingers.  Tingling or numbness in your hand.  An aching feeling in your entire arm, especially when your wrist and elbow are bent for long periods of time.  Wrist pain that goes up your arm to your shoulder.  Pain that goes down into your palm or  fingers.  A weak feeling in your hands. You may have trouble grabbing and holding items. Your symptoms may feel worse during the night. How is this diagnosed? This condition is diagnosed with a medical history and physical exam. You may also have tests, including:  An electromyogram (EMG). This test measures electrical signals sent by your nerves into the muscles.  X-rays. How is this treated? Treatment for this condition includes:  Lifestyle changes. It is important to stop doing or modify the activity that caused your condition.  Physical or occupational therapy.  Medicines for pain and inflammation. This may include medicine that is injected into your wrist.  A wrist splint.  Surgery. Follow these instructions at home: If you have a splint:   Wear it as told by your health care provider. Remove it only as told by your health care provider.  Loosen the splint if your fingers become numb and tingle, or if they turn cold and blue.  Keep the splint clean and dry. General instructions   Take over-the-counter and prescription medicines only as told by your health care provider.  Rest your wrist from any activity that may be causing your pain. If your condition is work related, talk to your employer about changes that can be made, such as getting a wrist pad to use while typing.  If directed, apply ice to the painful area: ? Put ice in a plastic bag. ? Place a towel between your skin and the  bag. ? Leave the ice on for 20 minutes, 2-3 times per day.  Keep all follow-up visits as told by your health care provider. This is important.  Do any exercises as told by your health care provider, physical therapist, or occupational therapist. Contact a health care provider if:  You have new symptoms.  Your pain is not controlled with medicines.  Your symptoms get worse. This information is not intended to replace advice given to you by your health care provider. Make sure you  discuss any questions you have with your health care provider. Document Released: 09/14/2000 Document Revised: 01/26/2016 Document Reviewed: 05/29/2017 Elsevier Interactive Patient Education  2017 [employer] Continue with occupational therapy with ROM exercises and soaks, use voltaren gel to decrease inflamation and swelling.  Use wrist splint for times when heavy use of arm is planned.  Follow-Up Instructions: No follow-ups on file.   Orders:  No orders of the defined types were placed in this encounter.  No orders of the defined types were placed in this encounter.   Imaging: No results found.  PMFS History: Patient Active Problem List   Diagnosis Date Noted  . Carpal tunnel syndrome, left upper limb 08/11/2019    Priority: High    Class: Chronic  . Trigger finger, left middle finger 09/05/2015    Priority: High    Class: Chronic  . Carpal tunnel syndrome, right 09/05/2015    Priority: Low    Class: Chronic  . DJD (degenerative joint disease), cervical 09/18/2016  . Other fatigue 09/18/2016  . Fibromyalgia 09/18/2016  . Primary osteoarthritis of both knees 09/18/2016  . Primary insomnia 09/18/2016  . Vitamin D deficiency 09/18/2016  . Pulmonary nodules 01/16/2013  . Dyspnea 01/13/2013   Past Medical History:  Diagnosis Date  . Anxiety    takes Ativan daily  . Back pain    stenosis and buldging disc  . Bone spur    neck and buldging disc  . Depression    takes Cymbalta daily  . Diabetes (HCC)    takes Metformin daily  . Diverticulosis   . GERD (gastroesophageal reflux disease)    takes Prevacid daily  . History of bronchitis    > 5 yrs ago  . History of hiatal hernia   . Hypothyroidism    takes Synthroid daily  . IBS (irritable bowel syndrome)   . Insomnia    doesn't take any meds  . Joint pain   . Lung nodule    right middle lobe-was being followed by Dr.Burney.Medical Md is keeping up with this  . Migraine   . Migraine    last one 08/28/15  .  Nocturia   . Numbness and tingling in hands   . PONV (postoperative nausea and vomiting)   . Sleep apnea   . Thoracic outlet syndrome   . TOS (thoracic outlet syndrome)   . Urinary urgency     Family History  Problem Relation Age of Onset  . COPD Mother   . Cancer Mother   . Heart disease Paternal Grandmother   . Heart disease Paternal Grandfather   . Heart disease Maternal Grandmother   . Heart disease Maternal Grandfather     Past Surgical History:  Procedure Laterality Date  . ABDOMINAL EXPLORATION SURGERY     cancer cells in cervix  . CARPAL TUNNEL RELEASE Right 09/05/2015   Procedure: RIGHT OPEN CARPAL TUNNEL RELEASE, RIGHT LONG FINGER TRIGGER RELEASE,RIGHT INDEX FINGER TRIGGER RELEASE;  Surgeon: Kerrin Champagne, MD;  Location: [facility];  Service: Orthopedics;  Laterality: Right;  . CARPAL TUNNEL RELEASE Left 08/11/2019   Procedure: LEFT OPEN CARPAL TUNNEL RELEASE AND LEFT LONG FINGER TRIGGER FINGER RELEASE;  Surgeon: Kerrin Champagne, MD;  Location: [facility];  Service: Orthopedics;  Laterality: Left;  . CHOLECYSTECTOMY    . COLONOSCOPY    . ESOPHAGOGASTRODUODENOSCOPY    . THYROIDECTOMY    . TONSILLECTOMY     adenoidectomy  . TRIGGER FINGER RELEASE Right 09/05/2015   Procedure: RELEASE TRIGGER FINGER/A-1 PULLEY;  Surgeon: Kerrin Champagne, MD;  Location: [facility];  Service: Orthopedics;  Laterality: Right;  . TRIGGER FINGER RELEASE Left 08/11/2019   Procedure: RELEASE TRIGGER FINGER/A-1 PULLEY;  Surgeon: Kerrin Champagne, MD;  Location: [facility];  Service: Orthopedics;  Laterality: Left;   Social History   Occupational History  . Not on file  Tobacco Use  . Smoking status: Former Smoker    Types: Cigarettes  . Smokeless tobacco: Never Used  Substance and Sexual Activity  . Alcohol use: No  . Drug use: No  . Sexual activity: Yes    Birth control/protection: Post-menopausal

## 2019-10-14 NOTE — Patient Instructions (Signed)
Carpal Tunnel Syndrome  Carpal tunnel syndrome is a condition that causes pain in your hand and arm. The carpal tunnel is a narrow area located on the palm side of your wrist. Repeated wrist motion or certain diseases may cause swelling within the tunnel. This swelling pinches the main nerve in the wrist (median nerve). What are the causes? This condition may be caused by:  Repeated wrist motions.  Wrist injuries.  Arthritis.  A cyst or tumor in the carpal tunnel.  Fluid buildup during pregnancy. Sometimes the cause of this condition is not known. What increases the risk? This condition is more likely to develop in:  People who have jobs that cause them to repeatedly move their wrists in the same motion, such as Art gallery manager.  Women.  People with certain conditions, such as: ? Diabetes. ? Obesity. ? An underactive thyroid (hypothyroidism). ? Kidney failure. What are the signs or symptoms? Symptoms of this condition include:  A tingling feeling in your fingers, especially in your thumb, index, and middle fingers.  Tingling or numbness in your hand.  An aching feeling in your entire arm, especially when your wrist and elbow are bent for long periods of time.  Wrist pain that goes up your arm to your shoulder.  Pain that goes down into your palm or fingers.  A weak feeling in your hands. You may have trouble grabbing and holding items. Your symptoms may feel worse during the night. How is this diagnosed? This condition is diagnosed with a medical history and physical exam. You may also have tests, including:  An electromyogram (EMG). This test measures electrical signals sent by your nerves into the muscles.  X-rays. How is this treated? Treatment for this condition includes:  Lifestyle changes. It is important to stop doing or modify the activity that caused your condition.  Physical or occupational therapy.  Medicines for pain and inflammation. This may  include medicine that is injected into your wrist.  A wrist splint.  Surgery. Follow these instructions at home: If you have a splint:   Wear it as told by your health care provider. Remove it only as told by your health care provider.  Loosen the splint if your fingers become numb and tingle, or if they turn cold and blue.  Keep the splint clean and dry. General instructions   Take over-the-counter and prescription medicines only as told by your health care provider.  Rest your wrist from any activity that may be causing your pain. If your condition is work related, talk to your employer about changes that can be made, such as getting a wrist pad to use while typing.  If directed, apply ice to the painful area: ? Put ice in a plastic bag. ? Place a towel between your skin and the bag. ? Leave the ice on for 20 minutes, 2-3 times per day.  Keep all follow-up visits as told by your health care provider. This is important.  Do any exercises as told by your health care provider, physical therapist, or occupational therapist. Contact a health care provider if:  You have new symptoms.  Your pain is not controlled with medicines.  Your symptoms get worse. This information is not intended to replace advice given to you by your health care provider. Make sure you discuss any questions you have with your health care provider. Document Released: 09/14/2000 Document Revised: 01/26/2016 Document Reviewed: 05/29/2017 Elsevier Interactive Patient Education  2017 [place] with occupational therapy with  ROM exercises and soaks, use voltaren gel to decrease inflamation and swelling.  Use wrist splint for times when heavy use of arm is planned.

## 2019-10-15 ENCOUNTER — Ambulatory Visit: Payer: BC Managed Care – PPO | Admitting: Physical Therapy

## 2019-10-20 ENCOUNTER — Ambulatory Visit: Payer: BC Managed Care – PPO | Admitting: Physical Therapy

## 2019-10-20 ENCOUNTER — Other Ambulatory Visit: Payer: Self-pay

## 2019-10-20 DIAGNOSIS — M25632 Stiffness of left wrist, not elsewhere classified: Secondary | ICD-10-CM

## 2019-10-20 DIAGNOSIS — M25642 Stiffness of left hand, not elsewhere classified: Secondary | ICD-10-CM | POA: Diagnosis not present

## 2019-10-20 DIAGNOSIS — M79642 Pain in left hand: Secondary | ICD-10-CM

## 2019-10-20 NOTE — Therapy (Signed)
[facility] [address] [place], [place], [address] Phone: [phone]   Fax:  [phone]  Physical Therapy Treatment  Patient Details  Name: Holly [name] MRN: [id number] Date of Birth: 1961-02-12 Referring Provider (PT): Basil Dess, MD   Encounter Date: 10/20/2019  PT End of Session - 10/20/19 1932    Visit Number  3    Date for PT Re-Evaluation  11/20/19    Authorization Type  [employer] PPO    Authorization Time Period  10/06/19 to 11/20/19    PT Start Time  1150    PT Stop Time  1229    PT Time Calculation (min)  39 min    Activity Tolerance  Patient tolerated treatment well;No increased pain       Past Medical History:  Diagnosis Date  . Anxiety    takes Ativan daily  . Back pain    stenosis and buldging disc  . Bone spur    neck and buldging disc  . Depression    takes Cymbalta daily  . Diabetes ([place])    takes Metformin daily  . Diverticulosis   . GERD (gastroesophageal reflux disease)    takes Prevacid daily  . History of bronchitis    > 5 yrs ago  . History of hiatal hernia   . Hypothyroidism    takes Synthroid daily  . IBS (irritable bowel syndrome)   . Insomnia    doesn't take any meds  . Joint pain   . Lung nodule    right middle lobe-was being followed by Dr.Burney.Medical Md is keeping up with this  . Migraine   . Migraine    last one 08/28/15  . Nocturia   . Numbness and tingling in hands   . PONV (postoperative nausea and vomiting)   . Sleep apnea   . Thoracic outlet syndrome   . TOS (thoracic outlet syndrome)   . Urinary urgency     Past Surgical History:  Procedure Laterality Date  . ABDOMINAL EXPLORATION SURGERY     cancer cells in cervix  . CARPAL TUNNEL RELEASE Right 09/05/2015   Procedure: RIGHT OPEN CARPAL TUNNEL RELEASE, RIGHT LONG FINGER TRIGGER RELEASE,RIGHT INDEX FINGER TRIGGER RELEASE;  Surgeon: Jessy Oto, MD;  Location: [facility];  Service: Orthopedics;   Laterality: Right;  . CARPAL TUNNEL RELEASE Left 08/11/2019   Procedure: LEFT OPEN CARPAL TUNNEL RELEASE AND LEFT LONG FINGER TRIGGER FINGER RELEASE;  Surgeon: Jessy Oto, MD;  Location: [facility];  Service: Orthopedics;  Laterality: Left;  . CHOLECYSTECTOMY    . COLONOSCOPY    . ESOPHAGOGASTRODUODENOSCOPY    . THYROIDECTOMY    . TONSILLECTOMY     adenoidectomy  . TRIGGER FINGER RELEASE Right 09/05/2015   Procedure: RELEASE TRIGGER FINGER/A-1 PULLEY;  Surgeon: Jessy Oto, MD;  Location: [facility];  Service: Orthopedics;  Laterality: Right;  . TRIGGER FINGER RELEASE Left 08/11/2019   Procedure: RELEASE TRIGGER FINGER/A-1 PULLEY;  Surgeon: Jessy Oto, MD;  Location: [facility];  Service: Orthopedics;  Laterality: Left;    There were no vitals filed for this visit.  Subjective Assessment - 10/20/19 1148    Subjective  Took a trip to [place].  My hand and wrist is really sore.  My wrist and palm scar is so hard and that one area swells.  I've been working hard on it.  Patient came at wrong appt time.    Pertinent History  diabetes; Rt  trigger finger/carpal tunnel release 09/05/15    Currently in Pain?  Yes    Pain Score  5     Pain Location  Hand                       [facility] Adult PT Treatment/Exercise - 10/20/19 0001      Self-Care   Other Self-Care Comments   discussed paraffin wax home option       Hand Exercises   Other Hand Exercises  review of tendon glides per HEP     Other Hand Exercises  cervical sidebending with therapist doing UE neural glides/tensioning at elbow, wrist and fingers      Manual Therapy   Manual therapy comments  passive Lt 3rd digit extension at PIP with DIP extended x5 reps     Joint Mobilization  Lt wrist AP and PA mobilizations grade III-IV x3 bouts each direciton to increase flexion and extension; Lt distal AP/PA radioulnar mobilization x2 bouts each; Lt 3rd digit DIP mobilization grade III x2 bouts AP  direction    Myofascial Release  Graston instrument assisted forearm extensor and flexor muscles, wrist and palmar scar mobilization                PT Short Term Goals - 10/06/19 1522      PT SHORT TERM GOAL #1   Title  Pt will demo consistency and independence with her initial HEP to decrease pain and strength in the Lt hand.    Time  2    Period  Weeks    Status  New    Target Date  10/20/19        PT Long Term Goals - 10/06/19 1524      PT LONG TERM GOAL #1   Title  Pt will have greater than 60 deg of active Lt wrist extension and flexion to improve efficiency with daily tasks.    Time  6    Period  Weeks    Status  New    Target Date  11/20/19      PT LONG TERM GOAL #2   Title  Pt will have atleast 15lb improvement in her Lt grip strength to increase her ability to use to Lt UE throughout the day.    Time  6    Period  Weeks    Status  New      PT LONG TERM GOAL #3   Title  Pt will have atleast 20 deg of active Lt finger extension without increase in pain.    Time  6    Period  Weeks    Status  New      PT LONG TERM GOAL #4   Title  Pt will report atleast 60% improvement in function and strength of the Lt hand from the start of PT.    Time  6    Period  Weeks    Status  New            Plan - 10/20/19 1150    Clinical Impression Statement  The patient expressed frustration in continued stiffness and soreness in her hand despite regular self massage and exercise.  Performed extensive manual therapy with instrument assisted Graston technique.   Some scar sensitivity but overall tolerated well.  She demonstrates improved wrist extension ROM as well as finger extension post treatment session. Encouraged regular ex today to maintain newly gained ROM.  Therapist closely monitoring response and modifying  as needed.    Rehab Potential  Good    PT Frequency  2x / week    PT Duration  6 weeks    PT Treatment/Interventions  ADLs/Self Care Home  Management;Cryotherapy;Moist Heat;Neuromuscular re-education;Therapeutic exercise;Therapeutic activities;Patient/family education;Dry needling;Manual techniques;Passive range of motion;Taping    PT Next Visit Plan  f/u on HEP; wrist mobilizations; finger tendon glides; progressions of hand strength/ROM    PT Home Exercise Plan  Access Code: [email or web address] URL: [email or web address] Date: 10/06/2019 Prepared by: Donita Brooks  Exercises Hand AROM Tendon Gliding Series- 5 reps- 5 hold- 3x daily- 7x weekly Towel Roll Squeeze- 10 reps- 3 sets- 1x daily- 7x weekly       Patient will benefit from skilled therapeutic intervention in order to improve the following deficits and impairments:  Pain, Decreased scar mobility, Hypomobility, Decreased strength, Decreased range of motion, Impaired flexibility  Visit Diagnosis: Stiffness of left hand, not elsewhere classified  Stiffness of left wrist, not elsewhere classified  Pain in left hand     Problem List Patient Active Problem List   Diagnosis Date Noted  . Carpal tunnel syndrome, left upper limb 08/11/2019    Class: Chronic  . DJD (degenerative joint disease), cervical 09/18/2016  . Other fatigue 09/18/2016  . Fibromyalgia 09/18/2016  . Primary osteoarthritis of both knees 09/18/2016  . Primary insomnia 09/18/2016  . Vitamin D deficiency 09/18/2016  . Carpal tunnel syndrome, right 09/05/2015    Class: Chronic  . Trigger finger, left middle finger 09/05/2015    Class: Chronic  . Pulmonary nodules 01/16/2013  . Dyspnea 01/13/2013   Lavinia Sharps, PT 10/20/19 7:39 PM Phone: [phone] Fax: [phone] Vivien Presto 10/20/2019, 7:38 PM  [facility] [address], [address] [place], [place], [address] Phone: [phone]   Fax:  [phone]  Name: Holly [name] MRN: [id number] Date of Birth: 1961/02/23

## 2019-10-22 ENCOUNTER — Encounter: Payer: Self-pay | Admitting: Physical Therapy

## 2019-10-22 ENCOUNTER — Ambulatory Visit: Payer: BC Managed Care – PPO | Admitting: Physical Therapy

## 2019-10-22 ENCOUNTER — Other Ambulatory Visit: Payer: Self-pay

## 2019-10-22 DIAGNOSIS — M25642 Stiffness of left hand, not elsewhere classified: Secondary | ICD-10-CM | POA: Diagnosis not present

## 2019-10-22 DIAGNOSIS — M25632 Stiffness of left wrist, not elsewhere classified: Secondary | ICD-10-CM | POA: Diagnosis not present

## 2019-10-22 DIAGNOSIS — M79642 Pain in left hand: Secondary | ICD-10-CM | POA: Diagnosis not present

## 2019-10-22 NOTE — Therapy (Signed)
[facility] [address], [address] [place], [place], [address] Phone: [phone]   Fax:  [phone]  Physical Therapy Treatment  Patient Details  Name: Holly [name] MRN: [id number] Date of Birth: 09/09/1961 Referring Provider (PT): James Nitka, MD   Encounter Date: 10/22/2019  PT End of Session - 10/22/19 1523    Visit Number  4    Date for PT Re-Evaluation  11/20/19    Authorization Type  [employer] PPO    Authorization Time Period  10/06/19 to 11/20/19    PT Start Time  1435    PT Stop Time  1518    PT Time Calculation (min)  43 min    Activity Tolerance  Patient tolerated treatment well;No increased pain    Behavior During Therapy  WFL for tasks assessed/performed       Past Medical History:  Diagnosis Date  . Anxiety    takes Ativan daily  . Back pain    stenosis and buldging disc  . Bone spur    neck and buldging disc  . Depression    takes Cymbalta daily  . Diabetes (HCC)    takes Metformin daily  . Diverticulosis   . GERD (gastroesophageal reflux disease)    takes Prevacid daily  . History of bronchitis    > 5 yrs ago  . History of hiatal hernia   . Hypothyroidism    takes Synthroid daily  . IBS (irritable bowel syndrome)   . Insomnia    doesn't take any meds  . Joint pain   . Lung nodule    right middle lobe-was being followed by Dr.Burney.Medical Md is keeping up with this  . Migraine   . Migraine    last one 08/28/15  . Nocturia   . Numbness and tingling in hands   . PONV (postoperative nausea and vomiting)   . Sleep apnea   . Thoracic outlet syndrome   . TOS (thoracic outlet syndrome)   . Urinary urgency     Past Surgical History:  Procedure Laterality Date  . ABDOMINAL EXPLORATION SURGERY     cancer cells in cervix  . CARPAL TUNNEL RELEASE Right 09/05/2015   Procedure: RIGHT OPEN CARPAL TUNNEL RELEASE, RIGHT LONG FINGER TRIGGER RELEASE,RIGHT INDEX FINGER TRIGGER RELEASE;  Surgeon: James  E Nitka, MD;  Location: [facility];  Service: Orthopedics;  Laterality: Right;  . CARPAL TUNNEL RELEASE Left 08/11/2019   Procedure: LEFT OPEN CARPAL TUNNEL RELEASE AND LEFT LONG FINGER TRIGGER FINGER RELEASE;  Surgeon: Nitka, James E, MD;  Location: [facility];  Service: Orthopedics;  Laterality: Left;  . CHOLECYSTECTOMY    . COLONOSCOPY    . ESOPHAGOGASTRODUODENOSCOPY    . THYROIDECTOMY    . TONSILLECTOMY     adenoidectomy  . TRIGGER FINGER RELEASE Right 09/05/2015   Procedure: RELEASE TRIGGER FINGER/A-1 PULLEY;  Surgeon: James E Nitka, MD;  Location: [facility];  Service: Orthopedics;  Laterality: Right;  . TRIGGER FINGER RELEASE Left 08/11/2019   Procedure: RELEASE TRIGGER FINGER/A-1 PULLEY;  Surgeon: Nitka, James E, MD;  Location: [facility];  Service: Orthopedics;  Laterality: Left;    There were no vitals filed for this visit.  Subjective Assessment - 10/22/19 1442    Subjective  Pt states that her hand was sore after her last session but she could move her hand pretty good for the rest of the day. It is sore today but heat helped loosen it up earlier.      Pertinent History  diabetes; Rt trigger finger/carpal tunnel release 09/05/15    Currently in Pain?  No/denies         [facility] PT Assessment - 10/22/19 0001      AROM   Left Wrist Extension  65 Degrees    Left Wrist Flexion  65 Degrees      Strength   Left Hand Grip (lbs)  30   35 in 1st position                  [facility] Adult PT Treatment/Exercise - 10/22/19 0001      Hand Exercises   PIPJ Extension  Strengthening;Left;10 reps    PIPJ Extension Limitations  5 sec hold, 3rd digit     Other Hand Exercises  review of HEP and manual techniques for home       Manual Therapy   Joint Mobilization  Lt wrist AP and PA mobilizations grade III-IV x3 bouts each direction to increase flexion and extension; Lt distal AP/PA radioulnar mobilization grade III-IV x2 bouts each    Soft tissue mobilization   Scar mobilization to both surgical incisions; scar mobilization with passive Lt 3rd digit MCP extension (with and without PIP extension), Scar mobilization/trapping with 3rd MCP passive extension; STM Lt wrist flexors/extensors; gentle Lt hand lymphatic massage working in a proximal to distal direction; STM Lt hypothenar musculature              PT Education - 10/22/19 1523    Education Details  progress towards goals    Person(s) Educated  Patient    Methods  Explanation    Comprehension  Verbalized understanding       PT Short Term Goals - 10/22/19 1527      PT SHORT TERM GOAL #1   Title  Pt will demo consistency and independence with her initial HEP to decrease pain and strength in the Lt hand.    Time  2    Period  Weeks    Status  Achieved    Target Date  10/20/19        PT Long Term Goals - 10/22/19 1527      PT LONG TERM GOAL #1   Title  Pt will have greater than 60 deg of active Lt wrist extension and flexion to improve efficiency with daily tasks.    Baseline  65 deg    Time  6    Period  Weeks    Status  Achieved      PT LONG TERM GOAL #2   Title  Pt will have atleast 15lb improvement in her Lt grip strength to increase her ability to use to Lt UE throughout the day.    Baseline  5lb    Time  6    Period  Weeks    Status  Partially Met      PT LONG TERM GOAL #3   Title  Pt will have atleast 20 deg of active Lt finger extension without increase in pain.    Time  6    Period  Weeks    Status  New      PT LONG TERM GOAL #4   Title  Pt will report atleast 60% improvement in function and strength of the Lt hand from the start of PT.    Time  6    Period  Weeks    Status  New            Plan -   10/22/19 1524    Clinical Impression Statement  Pt is making steady progress towards her goals. She responds well to joint mobilizations and soft tissue massage of the Lt forearm and hand. Pt's active wrist extension is 15 deg improved and flexion 30 deg  improved from the evaluation. Session continued with emphasis on manual treatment to break up scar adhesions and improve joint mobility in the wrist. Pt reported some discomfort during session, as expected, but she denied any overall increase in pain end of session.    Rehab Potential  Good    PT Frequency  2x / week    PT Duration  6 weeks    PT Treatment/Interventions  ADLs/Self Care Home Management;Cryotherapy;Moist Heat;Neuromuscular re-education;Therapeutic exercise;Therapeutic activities;Patient/family education;Dry needling;Manual techniques;Passive range of motion;Taping    PT Next Visit Plan  f/u on HEP; address wrist pronation/supination limitations; active wrist flexion/extension HEP; finger tendon glides; progressions of hand strength/ROM    PT Home Exercise Plan  Access Code: [email or web address]    Consulted and Agree with Plan of Care  Patient       Patient will benefit from skilled therapeutic intervention in order to improve the following deficits and impairments:  Pain, Decreased scar mobility, Hypomobility, Decreased strength, Decreased range of motion, Impaired flexibility  Visit Diagnosis: Stiffness of left hand, not elsewhere classified  Stiffness of left wrist, not elsewhere classified  Pain in left hand     Problem List Patient Active Problem List   Diagnosis Date Noted  . Carpal tunnel syndrome, left upper limb 08/11/2019    Class: Chronic  . DJD (degenerative joint disease), cervical 09/18/2016  . Other fatigue 09/18/2016  . Fibromyalgia 09/18/2016  . Primary osteoarthritis of both knees 09/18/2016  . Primary insomnia 09/18/2016  . Vitamin D deficiency 09/18/2016  . Carpal tunnel syndrome, right 09/05/2015    Class: Chronic  . Trigger finger, left middle finger 09/05/2015    Class: Chronic  . Pulmonary nodules 01/16/2013  . Dyspnea 01/13/2013    3:29 PM,10/22/19   PT, DPT [facility]  [phone]  [facility] [address], [address] [place], [place], [address] Phone: [phone]   Fax:  [phone]  Name: Holly [name] MRN: [id number] Date of Birth: 10/08/1960   

## 2019-10-27 ENCOUNTER — Ambulatory Visit: Payer: BC Managed Care – PPO | Admitting: Physical Therapy

## 2019-10-27 ENCOUNTER — Encounter: Payer: Self-pay | Admitting: Physical Therapy

## 2019-10-27 ENCOUNTER — Other Ambulatory Visit: Payer: Self-pay

## 2019-10-27 DIAGNOSIS — M79642 Pain in left hand: Secondary | ICD-10-CM

## 2019-10-27 DIAGNOSIS — M25642 Stiffness of left hand, not elsewhere classified: Secondary | ICD-10-CM

## 2019-10-27 DIAGNOSIS — M25632 Stiffness of left wrist, not elsewhere classified: Secondary | ICD-10-CM | POA: Diagnosis not present

## 2019-10-27 NOTE — Therapy (Signed)
[facility] [address] [place], [place], [address] Phone: [phone]   Fax:  [phone]  Physical Therapy Treatment  Patient Details  Name: Holly [name] MRN: [id number] Date of Birth: [birth year] Referring Provider (PT): Basil Dess, MD   Encounter Date: 10/27/2019  PT End of Session - 10/27/19 1458    Visit Number  5    Date for PT Re-Evaluation  11/20/19    Authorization Type  [employer] PPO    Authorization Time Period  10/06/19 to 11/20/19    PT Start Time  1400    PT Stop Time  1444    PT Time Calculation (min)  44 min    Activity Tolerance  Patient tolerated treatment well;No increased pain    Behavior During Therapy  WFL for tasks assessed/performed       Past Medical History:  Diagnosis Date  . Anxiety    takes Ativan daily  . Back pain    stenosis and buldging disc  . Bone spur    neck and buldging disc  . Depression    takes Cymbalta daily  . Diabetes ([place])    takes Metformin daily  . Diverticulosis   . GERD (gastroesophageal reflux disease)    takes Prevacid daily  . History of bronchitis    > 5 yrs ago  . History of hiatal hernia   . Hypothyroidism    takes Synthroid daily  . IBS (irritable bowel syndrome)   . Insomnia    doesn't take any meds  . Joint pain   . Lung nodule    right middle lobe-was being followed by Dr.Burney.Medical Md is keeping up with this  . Migraine   . Migraine    last one 08/28/15  . Nocturia   . Numbness and tingling in hands   . PONV (postoperative nausea and vomiting)   . Sleep apnea   . Thoracic outlet syndrome   . TOS (thoracic outlet syndrome)   . Urinary urgency     Past Surgical History:  Procedure Laterality Date  . ABDOMINAL EXPLORATION SURGERY     cancer cells in cervix  . CARPAL TUNNEL RELEASE Right 09/05/2015   Procedure: RIGHT OPEN CARPAL TUNNEL RELEASE, RIGHT LONG FINGER TRIGGER RELEASE,RIGHT INDEX FINGER TRIGGER RELEASE;  Surgeon: Jessy Oto, MD;  Location: [facility];  Service: Orthopedics;  Laterality: Right;  . CARPAL TUNNEL RELEASE Left 08/11/2019   Procedure: LEFT OPEN CARPAL TUNNEL RELEASE AND LEFT LONG FINGER TRIGGER FINGER RELEASE;  Surgeon: Jessy Oto, MD;  Location: [place];  Service: Orthopedics;  Laterality: Left;  . CHOLECYSTECTOMY    . COLONOSCOPY    . ESOPHAGOGASTRODUODENOSCOPY    . THYROIDECTOMY    . TONSILLECTOMY     adenoidectomy  . TRIGGER FINGER RELEASE Right 09/05/2015   Procedure: RELEASE TRIGGER FINGER/A-1 PULLEY;  Surgeon: Jessy Oto, MD;  Location: [facility];  Service: Orthopedics;  Laterality: Right;  . TRIGGER FINGER RELEASE Left 08/11/2019   Procedure: RELEASE TRIGGER FINGER/A-1 PULLEY;  Surgeon: Jessy Oto, MD;  Location: [facility];  Service: Orthopedics;  Laterality: Left;    There were no vitals filed for this visit.  Subjective Assessment - 10/27/19 1702    Subjective  Pt states her hand was sore this past week. She has been working diligently on improving her stiffness and swelling. Still frustrated that she wakes up with it stiff.    Currently in Pain?  Yes  Pain Score  6     Pain Location  Hand    Pain Orientation  Left    Pain Descriptors / Indicators  Sore;Tightness    Pain Type  Surgical pain    Pain Radiating Towards  none    Pain Onset  More than a month ago    Pain Frequency  Intermittent                       [facility] Adult PT Treatment/Exercise - 10/27/19 0001      Hand Exercises   Other Hand Exercises  Lt wrist pronation/supination holding #3 dumbbell  x15 reps       Manual Therapy   Joint Mobilization  Lt AP proximal radioulnar mobilization x3 bouts grade III-IV    Soft tissue mobilization  Scar mobilization to both surgical incisions; scar mobilization with passive Lt 3rd digit MCP extension (with and without PIP extension), Scar mobilization/trapping with 3rd MCP passive extension; STM Lt wrist flexors/extensors;  gentle Lt hand lymphatic massage working in a proximal to distal direction; STM Lt hypothenar musculature     Myofascial Release  trigger point release wrist extensor group with passive wrist flexion stretch x10 reps              PT Education - 10/27/19 1444    Education Details  dropping back on length of time completing massage/etc at home    Person(s) Educated  Patient    Methods  Explanation    Comprehension  Verbalized understanding       PT Short Term Goals - 10/22/19 1527      PT SHORT TERM GOAL #1   Title  Pt will demo consistency and independence with her initial HEP to decrease pain and strength in the Lt hand.    Time  2    Period  Weeks    Status  Achieved    Target Date  10/20/19        PT Long Term Goals - 10/22/19 1527      PT LONG TERM GOAL #1   Title  Pt will have greater than 60 deg of active Lt wrist extension and flexion to improve efficiency with daily tasks.    Baseline  65 deg    Time  6    Period  Weeks    Status  Achieved      PT LONG TERM GOAL #2   Title  Pt will have atleast 15lb improvement in her Lt grip strength to increase her ability to use to Lt UE throughout the day.    Baseline  5lb    Time  6    Period  Weeks    Status  Partially Met      PT LONG TERM GOAL #3   Title  Pt will have atleast 20 deg of active Lt finger extension without increase in pain.    Time  6    Period  Weeks    Status  New      PT LONG TERM GOAL #4   Title  Pt will report atleast 60% improvement in function and strength of the Lt hand from the start of PT.    Time  6    Period  Weeks    Status  New            Plan - 10/27/19 1509    Clinical Impression Statement  Today's session continued with heavy focus on manual treatment to  increase hand/wrist/forearm mobility. Pt was educated on the importance of allowing her muscles/body to recover between massage/exercise and not over do things during the session. Pt has palpable trigger points in the  wrist extensors and would benefit from further treatment addressing this. Will consider dry needling next session. No increase in pain was noted end of session.    Rehab Potential  Good    PT Frequency  2x / week    PT Duration  6 weeks    PT Treatment/Interventions  ADLs/Self Care Home Management;Cryotherapy;Moist Heat;Neuromuscular re-education;Therapeutic exercise;Therapeutic activities;Patient/family education;Dry needling;Manual techniques;Passive range of motion;Taping    PT Next Visit Plan  DN wrist extensors; address wrist pronation/supination limitations; active wrist flexion/extension HEP; finger tendon glides; progressions of hand strength/ROM    PT Home Exercise Plan  Access Code: [email or web address]    [email or web address] and Agree with Plan of Care  Patient       Patient will benefit from skilled therapeutic intervention in order to improve the following deficits and impairments:  Pain, Decreased scar mobility, Hypomobility, Decreased strength, Decreased range of motion, Impaired flexibility  Visit Diagnosis: Stiffness of left hand, not elsewhere classified  Stiffness of left wrist, not elsewhere classified  Pain in left hand     Problem List Patient Active Problem List   Diagnosis Date Noted  . Carpal tunnel syndrome, left upper limb 08/11/2019    Class: Chronic  . DJD (degenerative joint disease), cervical 09/18/2016  . Other fatigue 09/18/2016  . Fibromyalgia 09/18/2016  . Primary osteoarthritis of both knees 09/18/2016  . Primary insomnia 09/18/2016  . Vitamin D deficiency 09/18/2016  . Carpal tunnel syndrome, right 09/05/2015    Class: Chronic  . Trigger finger, left middle finger 09/05/2015    Class: Chronic  . Pulmonary nodules 01/16/2013  . Dyspnea 01/13/2013   5:04 PM,10/27/19 Sherol Dade PT, DPT [place] [facility] [place]  [facility] [address] [place],  [place], [address] Phone: [phone]   Fax:  [phone]  Name: Holly [name] MRN: [id number] Date of Birth: 1961/05/21

## 2019-10-29 ENCOUNTER — Ambulatory Visit: Payer: BC Managed Care – PPO | Admitting: Physical Therapy

## 2019-11-03 ENCOUNTER — Encounter: Payer: Self-pay | Admitting: Physical Therapy

## 2019-11-03 ENCOUNTER — Other Ambulatory Visit: Payer: Self-pay

## 2019-11-03 ENCOUNTER — Ambulatory Visit: Payer: BC Managed Care – PPO | Attending: Specialist | Admitting: Physical Therapy

## 2019-11-03 DIAGNOSIS — M25632 Stiffness of left wrist, not elsewhere classified: Secondary | ICD-10-CM

## 2019-11-03 DIAGNOSIS — M79642 Pain in left hand: Secondary | ICD-10-CM

## 2019-11-03 DIAGNOSIS — M25642 Stiffness of left hand, not elsewhere classified: Secondary | ICD-10-CM | POA: Diagnosis not present

## 2019-11-03 DIAGNOSIS — R600 Localized edema: Secondary | ICD-10-CM | POA: Diagnosis not present

## 2019-11-03 NOTE — Patient Instructions (Signed)

## 2019-11-03 NOTE — Therapy (Signed)
[facility] [address] [place], [place], [address] Phone: [phone]   Fax:  [phone]  Physical Therapy Treatment  Patient Details  Name: Holly [name] MRN: [id number] Date of Birth: April 24, 1961 Referring Provider (PT): Basil Dess, MD   Encounter Date: 11/03/2019  PT End of Session - 11/03/19 1710    Visit Number  6    Date for PT Re-Evaluation  11/20/19    Authorization Type  [employer] PPO    Authorization Time Period  10/06/19 to 11/20/19    PT Start Time  3220    PT Stop Time  1530    PT Time Calculation (min)  45 min    Activity Tolerance  Patient tolerated treatment well;No increased pain    Behavior During Therapy  WFL for tasks assessed/performed       Past Medical History:  Diagnosis Date  . Anxiety    takes Ativan daily  . Back pain    stenosis and buldging disc  . Bone spur    neck and buldging disc  . Depression    takes Cymbalta daily  . Diabetes ([place])    takes Metformin daily  . Diverticulosis   . GERD (gastroesophageal reflux disease)    takes Prevacid daily  . History of bronchitis    > 5 yrs ago  . History of hiatal hernia   . Hypothyroidism    takes Synthroid daily  . IBS (irritable bowel syndrome)   . Insomnia    doesn't take any meds  . Joint pain   . Lung nodule    right middle lobe-was being followed by Dr.Burney.Medical Md is keeping up with this  . Migraine   . Migraine    last one 08/28/15  . Nocturia   . Numbness and tingling in hands   . PONV (postoperative nausea and vomiting)   . Sleep apnea   . Thoracic outlet syndrome   . TOS (thoracic outlet syndrome)   . Urinary urgency     Past Surgical History:  Procedure Laterality Date  . ABDOMINAL EXPLORATION SURGERY     cancer cells in cervix  . CARPAL TUNNEL RELEASE Right 09/05/2015   Procedure: RIGHT OPEN CARPAL TUNNEL RELEASE, RIGHT LONG FINGER TRIGGER RELEASE,RIGHT INDEX FINGER TRIGGER RELEASE;  Surgeon: Jessy Oto, MD;  Location: [facility];  Service: Orthopedics;  Laterality: Right;  . CARPAL TUNNEL RELEASE Left 08/11/2019   Procedure: LEFT OPEN CARPAL TUNNEL RELEASE AND LEFT LONG FINGER TRIGGER FINGER RELEASE;  Surgeon: Jessy Oto, MD;  Location: [facility];  Service: Orthopedics;  Laterality: Left;  . CHOLECYSTECTOMY    . COLONOSCOPY    . ESOPHAGOGASTRODUODENOSCOPY    . THYROIDECTOMY    . TONSILLECTOMY     adenoidectomy  . TRIGGER FINGER RELEASE Right 09/05/2015   Procedure: RELEASE TRIGGER FINGER/A-1 PULLEY;  Surgeon: Jessy Oto, MD;  Location: [facility];  Service: Orthopedics;  Laterality: Right;  . TRIGGER FINGER RELEASE Left 08/11/2019   Procedure: RELEASE TRIGGER FINGER/A-1 PULLEY;  Surgeon: Jessy Oto, MD;  Location: [place];  Service: Orthopedics;  Laterality: Left;    There were no vitals filed for this visit.  Subjective Assessment - 11/03/19 1452    Subjective  Pt states that her Lt hand was very tight and hurt bad after cleaning a heavy cast iron skillet.    Currently in Pain?  --   higher amounts of pain due to cleaning her skillet  Pain Onset  More than a month ago                       [facility] Adult PT Treatment/Exercise - 11/03/19 0001      Exercises   Exercises  Wrist      Hand Exercises   Other Hand Exercises  velcro board: Lt UE key turn x5 reps, Lt wrist flexion/extension with long handle x5 reps, supination/pronation with long handle x5 reps       Wrist Exercises   Wrist Extension  PROM;Left    Wrist Extension Limitations  closed chain stretch with hands on table 5x10 sec hold       Manual Therapy   Manual therapy comments  passive ROM: 3rd DIP flexion, Lumbrical stretch 3rd digit x5 reps, Lt digit PIP extension hold with wrist extension and supination x5 reps     Soft tissue mobilization  STM Lt wrist extensors, gentle lymphatic massage to Lt hand to decrease swelling        Trigger Point Dry Needling -  11/03/19 0001    Consent Given?  Yes    Education Handout Provided  Yes    Muscles Treated Wrist/Hand  Extensor carpi radialis longus/brevis;Extensor digitorum    Extensor carpi radialis longus/brevis Response  Twitch response elicited;Palpable increased muscle length   Lt    Extensor digitorum Response  Twitch response elicited;Palpable increased muscle length   Lt           PT Education - 11/03/19 1709    Education Details  technique with therex; dry needling aftercare    Person(s) Educated  Patient    Methods  Explanation;Verbal cues;Handout    Comprehension  Verbalized understanding;Returned demonstration       PT Short Term Goals - 10/22/19 1527      PT SHORT TERM GOAL #1   Title  Pt will demo consistency and independence with her initial HEP to decrease pain and strength in the Lt hand.    Time  2    Period  Weeks    Status  Achieved    Target Date  10/20/19        PT Long Term Goals - 10/22/19 1527      PT LONG TERM GOAL #1   Title  Pt will have greater than 60 deg of active Lt wrist extension and flexion to improve efficiency with daily tasks.    Baseline  65 deg    Time  6    Period  Weeks    Status  Achieved      PT LONG TERM GOAL #2   Title  Pt will have atleast 15lb improvement in her Lt grip strength to increase her ability to use to Lt UE throughout the day.    Baseline  5lb    Time  6    Period  Weeks    Status  Partially Met      PT LONG TERM GOAL #3   Title  Pt will have atleast 20 deg of active Lt finger extension without increase in pain.    Time  6    Period  Weeks    Status  New      PT LONG TERM GOAL #4   Title  Pt will report atleast 60% improvement in function and strength of the Lt hand from the start of PT.    Time  6    Period  Weeks    Status  New            Plan - 11/03/19 1710    Clinical Impression Statement  Pt continues to report Lt hand stiffness and soreness with weekly activity. Pt was agreeable to dry needling  treatment of the Lt wrist extensors, with several twitch responses noted. PT completed soft tissue mobilization to the region as well as passive stretching and tendon glides in the Lt hand. Pt had increased 3rd PIP extension end of session and denied pain with passive 3rd DIP/PIP flexion. Will continue with current POC.    Rehab Potential  Good    PT Frequency  2x / week    PT Duration  6 weeks    PT Treatment/Interventions  ADLs/Self Care Home Management;Cryotherapy;Moist Heat;Neuromuscular re-education;Therapeutic exercise;Therapeutic activities;Patient/family education;Dry needling;Manual techniques;Passive range of motion;Taping    PT Next Visit Plan  f/u on d/n; possible d/n thumb; address wrist pronation/supination limitations; active wrist flexion/extension HEP; finger tendon glides; progressions of hand strength/ROM    PT Home Exercise Plan  Access Code: [email or web address]    [email or web address] and Agree with Plan of Care  Patient       Patient will benefit from skilled therapeutic intervention in order to improve the following deficits and impairments:  Pain, Decreased scar mobility, Hypomobility, Decreased strength, Decreased range of motion, Impaired flexibility  Visit Diagnosis: Stiffness of left hand, not elsewhere classified  Stiffness of left wrist, not elsewhere classified  Pain in left hand     Problem List Patient Active Problem List   Diagnosis Date Noted  . Carpal tunnel syndrome, left upper limb 08/11/2019    Class: Chronic  . DJD (degenerative joint disease), cervical 09/18/2016  . Other fatigue 09/18/2016  . Fibromyalgia 09/18/2016  . Primary osteoarthritis of both knees 09/18/2016  . Primary insomnia 09/18/2016  . Vitamin D deficiency 09/18/2016  . Carpal tunnel syndrome, right 09/05/2015    Class: Chronic  . Trigger finger, left middle finger 09/05/2015    Class: Chronic  . Pulmonary nodules 01/16/2013  . Dyspnea 01/13/2013    5:15 PM,11/03/19 Sherol Dade PT,  DPT [place] [facility] [address], [place], [place], [address] Phone: [phone]   Fax:  [phone]  Name: Lekeisha Arenas [place] MRN: [id number] Date of Birth: 1960-11-23

## 2019-11-05 ENCOUNTER — Encounter: Payer: Self-pay | Admitting: Physical Therapy

## 2019-11-05 ENCOUNTER — Ambulatory Visit: Payer: BC Managed Care – PPO | Admitting: Physical Therapy

## 2019-11-05 ENCOUNTER — Other Ambulatory Visit: Payer: Self-pay

## 2019-11-05 DIAGNOSIS — R600 Localized edema: Secondary | ICD-10-CM | POA: Diagnosis not present

## 2019-11-05 DIAGNOSIS — M25632 Stiffness of left wrist, not elsewhere classified: Secondary | ICD-10-CM

## 2019-11-05 DIAGNOSIS — M79642 Pain in left hand: Secondary | ICD-10-CM | POA: Diagnosis not present

## 2019-11-05 DIAGNOSIS — M25642 Stiffness of left hand, not elsewhere classified: Secondary | ICD-10-CM | POA: Diagnosis not present

## 2019-11-05 NOTE — Therapy (Signed)
[facility] [address] [place], [place], [address] Phone: [phone]   Fax:  [phone]  Physical Therapy Treatment  Patient Details  Name: Holly [name] MRN: [id number] Date of Birth: Apr 02, 1961 Referring Provider (PT): Basil Dess, MD   Encounter Date: 11/05/2019  PT End of Session - 11/05/19 1718    Visit Number  7    Date for PT Re-Evaluation  11/20/19    Authorization Type  [employer] PPO    Authorization Time Period  10/06/19 to 11/20/19    PT Start Time  1447    PT Stop Time  1530    PT Time Calculation (min)  43 min    Activity Tolerance  Patient tolerated treatment well;No increased pain    Behavior During Therapy  WFL for tasks assessed/performed       Past Medical History:  Diagnosis Date  . Anxiety    takes Ativan daily  . Back pain    stenosis and buldging disc  . Bone spur    neck and buldging disc  . Depression    takes Cymbalta daily  . Diabetes ([place])    takes Metformin daily  . Diverticulosis   . GERD (gastroesophageal reflux disease)    takes Prevacid daily  . History of bronchitis    > 5 yrs ago  . History of hiatal hernia   . Hypothyroidism    takes Synthroid daily  . IBS (irritable bowel syndrome)   . Insomnia    doesn't take any meds  . Joint pain   . Lung nodule    right middle lobe-was being followed by Dr.Burney.Medical Md is keeping up with this  . Migraine   . Migraine    last one 08/28/15  . Nocturia   . Numbness and tingling in hands   . PONV (postoperative nausea and vomiting)   . Sleep apnea   . Thoracic outlet syndrome   . TOS (thoracic outlet syndrome)   . Urinary urgency     Past Surgical History:  Procedure Laterality Date  . ABDOMINAL EXPLORATION SURGERY     cancer cells in cervix  . CARPAL TUNNEL RELEASE Right 09/05/2015   Procedure: RIGHT OPEN CARPAL TUNNEL RELEASE, RIGHT LONG FINGER TRIGGER RELEASE,RIGHT INDEX FINGER TRIGGER RELEASE;  Surgeon: Jessy Oto, MD;  Location: [place] [facility];  Service: Orthopedics;  Laterality: Right;  . CARPAL TUNNEL RELEASE Left 08/11/2019   Procedure: LEFT OPEN CARPAL TUNNEL RELEASE AND LEFT LONG FINGER TRIGGER FINGER RELEASE;  Surgeon: Jessy Oto, MD;  Location: [facility];  Service: Orthopedics;  Laterality: Left;  . CHOLECYSTECTOMY    . COLONOSCOPY    . ESOPHAGOGASTRODUODENOSCOPY    . THYROIDECTOMY    . TONSILLECTOMY     adenoidectomy  . TRIGGER FINGER RELEASE Right 09/05/2015   Procedure: RELEASE TRIGGER FINGER/A-1 PULLEY;  Surgeon: Jessy Oto, MD;  Location: [facility];  Service: Orthopedics;  Laterality: Right;  . TRIGGER FINGER RELEASE Left 08/11/2019   Procedure: RELEASE TRIGGER FINGER/A-1 PULLEY;  Surgeon: Jessy Oto, MD;  Location: [facility];  Service: Orthopedics;  Laterality: Left;    There were no vitals filed for this visit.  Subjective Assessment - 11/05/19 1450    Subjective  Pt states that her hand was sore this morning and she spent a good 2 hours working out the soreness.    Currently in Pain?  Yes    Pain Score  6  Pain Location  Hand    Pain Orientation  Left    Pain Descriptors / Indicators  Sore;Tightness    Pain Type  Chronic pain;Surgical pain    Pain Radiating Towards  none    Pain Onset  More than a month ago    Pain Frequency  Constant         [facility] PT Assessment - 11/05/19 0001      AROM   Overall AROM Comments  3rd finger extension 40 deg passive                   [facility] Adult PT Treatment/Exercise - 11/05/19 0001      Hand Exercises   Tendon Glides  Lt hand/wrist tendon glide 4 positions x5 rounds       Wrist Exercises   Wrist Extension  Left;15 reps    Wrist Extension Limitations  eccentric only for additional stretch, #5 dumbbell     Other wrist exercises  Lt forearm pronation/supination holding end of #4 dumbbell x20 reps, PT cuing to avoid shoulder/elbow compensations     Other wrist exercises  --       Manual Therapy   Manual therapy comments  Passive ROM: Lt 4th digit MCP extension with wrist in neutral x10 and wrist in extension x10; Lt MCP extension with PIP/DIP extension and wrist in neutral x10 reps, passive tendon with Lt MCP flexion/extension and wrist flexion/extension to increase MCP extension    Joint Mobilization  Lt hand 4th MCP mobilization with movement x10 reps pain free              PT Education - 11/05/19 1527    Education Details  updated HEP with wrist tendon glide    Person(s) Educated  Patient    Methods  Explanation;Handout;Demonstration    Comprehension  Verbalized understanding;Returned demonstration       PT Short Term Goals - 10/22/19 1527      PT SHORT TERM GOAL #1   Title  Pt will demo consistency and independence with her initial HEP to decrease pain and strength in the Lt hand.    Time  2    Period  Weeks    Status  Achieved    Target Date  10/20/19        PT Long Term Goals - 10/22/19 1527      PT LONG TERM GOAL #1   Title  Pt will have greater than 60 deg of active Lt wrist extension and flexion to improve efficiency with daily tasks.    Baseline  65 deg    Time  6    Period  Weeks    Status  Achieved      PT LONG TERM GOAL #2   Title  Pt will have atleast 15lb improvement in her Lt grip strength to increase her ability to use to Lt UE throughout the day.    Baseline  5lb    Time  6    Period  Weeks    Status  Partially Met      PT LONG TERM GOAL #3   Title  Pt will have atleast 20 deg of active Lt finger extension without increase in pain.    Time  6    Period  Weeks    Status  New      PT LONG TERM GOAL #4   Title  Pt will report atleast 60% improvement in function and strength of the Lt hand from  the start of PT.    Time  6    Period  Weeks    Status  New            Plan - 11/05/19 1719    Clinical Impression Statement  Pt is growing frustrated by the increased swelling and stiffness she experiences in the  mornings. She continues to work on her HEP without reported difficulty. Session focused on combination of exercises to increase wrist/forearm strength and flexibility as well as manual techniques to improve tendon mobility of the Lt hand 4th digit. Pt required PT demonstration for new tendon glide provided end of session but was able to complete with reference the handout without issue. Pt had increased passive 4th digit MCP extension to 40 deg end of today's session. Will continue to monitor progress between sessions.    Rehab Potential  Good    PT Frequency  2x / week    PT Duration  6 weeks    PT Treatment/Interventions  ADLs/Self Care Home Management;Cryotherapy;Moist Heat;Neuromuscular re-education;Therapeutic exercise;Therapeutic activities;Patient/family education;Dry needling;Manual techniques;Passive range of motion;Taping    PT Next Visit Plan  measure MCP extension; possible d/n thumb; address wrist pronation/supination limitations; active wrist flexion/extension HEP; finger tendon glides; progressions of hand strength/ROM    PT Home Exercise Plan  Access Code: [email or web address]    [email or web address] and Agree with Plan of Care  Patient       Patient will benefit from skilled therapeutic intervention in order to improve the following deficits and impairments:  Pain, Decreased scar mobility, Hypomobility, Decreased strength, Decreased range of motion, Impaired flexibility  Visit Diagnosis: Stiffness of left hand, not elsewhere classified  Stiffness of left wrist, not elsewhere classified  Pain in left hand     Problem List Patient Active Problem List   Diagnosis Date Noted  . Carpal tunnel syndrome, left upper limb 08/11/2019    Class: Chronic  . DJD (degenerative joint disease), cervical 09/18/2016  . Other fatigue 09/18/2016  . Fibromyalgia 09/18/2016  . Primary osteoarthritis of both knees 09/18/2016  . Primary insomnia 09/18/2016  . Vitamin D deficiency 09/18/2016  . Carpal tunnel  syndrome, right 09/05/2015    Class: Chronic  . Trigger finger, left middle finger 09/05/2015    Class: Chronic  . Pulmonary nodules 01/16/2013  . Dyspnea 01/13/2013    5:28 PM,11/05/19 Sherol Dade PT, DPT [facility] [address] [place], [place], [address] Phone: [phone]   Fax:  [phone]  Name: Holly [name] MRN: [id number] Date of Birth: 10-21-1960

## 2019-11-11 ENCOUNTER — Ambulatory Visit: Payer: BC Managed Care – PPO | Admitting: Physical Therapy

## 2019-11-11 ENCOUNTER — Other Ambulatory Visit: Payer: Self-pay

## 2019-11-11 DIAGNOSIS — M79642 Pain in left hand: Secondary | ICD-10-CM

## 2019-11-11 DIAGNOSIS — M25632 Stiffness of left wrist, not elsewhere classified: Secondary | ICD-10-CM

## 2019-11-11 DIAGNOSIS — M25642 Stiffness of left hand, not elsewhere classified: Secondary | ICD-10-CM | POA: Diagnosis not present

## 2019-11-11 DIAGNOSIS — R600 Localized edema: Secondary | ICD-10-CM | POA: Diagnosis not present

## 2019-11-11 NOTE — Therapy (Addendum)
[facility] [address] [place], [place], [address] Phone: [phone]   Fax:  [phone]  Physical Therapy Treatment/discharge  Patient Details  Name: Holly [name] MRN: [id number] Date of Birth: 1961-02-16 Referring Provider (PT): Basil Dess, MD   Encounter Date: 11/11/2019  PT End of Session - 11/11/19 1248    Visit Number  8    Date for PT Re-Evaluation  11/20/19    Authorization Type  [employer] PPO    Authorization Time Period  10/06/19 to 11/20/19    PT Start Time  1145    PT Stop Time  1230    PT Time Calculation (min)  45 min    Activity Tolerance  Patient tolerated treatment well;No increased pain    Behavior During Therapy  WFL for tasks assessed/performed       Past Medical History:  Diagnosis Date  . Anxiety    takes Ativan daily  . Back pain    stenosis and buldging disc  . Bone spur    neck and buldging disc  . Depression    takes Cymbalta daily  . Diabetes ([place])    takes Metformin daily  . Diverticulosis   . GERD (gastroesophageal reflux disease)    takes Prevacid daily  . History of bronchitis    > 5 yrs ago  . History of hiatal hernia   . Hypothyroidism    takes Synthroid daily  . IBS (irritable bowel syndrome)   . Insomnia    doesn't take any meds  . Joint pain   . Lung nodule    right middle lobe-was being followed by Dr.Burney.Medical Md is keeping up with this  . Migraine   . Migraine    last one 08/28/15  . Nocturia   . Numbness and tingling in hands   . PONV (postoperative nausea and vomiting)   . Sleep apnea   . Thoracic outlet syndrome   . TOS (thoracic outlet syndrome)   . Urinary urgency     Past Surgical History:  Procedure Laterality Date  . ABDOMINAL EXPLORATION SURGERY     cancer cells in cervix  . CARPAL TUNNEL RELEASE Right 09/05/2015   Procedure: RIGHT OPEN CARPAL TUNNEL RELEASE, RIGHT LONG FINGER TRIGGER RELEASE,RIGHT INDEX FINGER TRIGGER RELEASE;   Surgeon: Jessy Oto, MD;  Location: [facility];  Service: Orthopedics;  Laterality: Right;  . CARPAL TUNNEL RELEASE Left 08/11/2019   Procedure: LEFT OPEN CARPAL TUNNEL RELEASE AND LEFT LONG FINGER TRIGGER FINGER RELEASE;  Surgeon: Jessy Oto, MD;  Location: [facility];  Service: Orthopedics;  Laterality: Left;  . CHOLECYSTECTOMY    . COLONOSCOPY    . ESOPHAGOGASTRODUODENOSCOPY    . THYROIDECTOMY    . TONSILLECTOMY     adenoidectomy  . TRIGGER FINGER RELEASE Right 09/05/2015   Procedure: RELEASE TRIGGER FINGER/A-1 PULLEY;  Surgeon: Jessy Oto, MD;  Location: [facility];  Service: Orthopedics;  Laterality: Right;  . TRIGGER FINGER RELEASE Left 08/11/2019   Procedure: RELEASE TRIGGER FINGER/A-1 PULLEY;  Surgeon: Jessy Oto, MD;  Location: [facility];  Service: Orthopedics;  Laterality: Left;    There were no vitals filed for this visit.  Subjective Assessment - 11/11/19 1334    Subjective  Pt states that she noted onset of Lt wrist/thumb swelling over the weekend. It has been sore since then.    Currently in Pain?  No/denies    Pain Onset  More than a month ago  [facility] PT Assessment - 11/11/19 0001      AROM   Overall AROM Comments  20                   [facility] Adult PT Treatment/Exercise - 11/11/19 0001      Exercises   Exercises  Other Exercises    Other Exercises   PT educating pt on exercise intensity/frequency; demo of Lt MCP extension with proper hand placement      Manual Therapy   Manual therapy comments  gentle passive 3rd digit MCP extension with and without DIP/PIP extension    Soft tissue mobilization  STM wrist extensors, wrist flexors (focus on flexor and extensor radialis)              PT Education - 11/11/19 1243    Education Details  educated pt on expecations of recovery and progress towards goals; encouraged pt to speak with referring physician regarding new onset swelling/soreness at wrist; importance  of allowing time for hand/wrist to recover from manual massage/etc. and focusing on gentle stretches instead    Person(s) Educated  Patient    Methods  Explanation;Verbal cues    Comprehension  Verbalized understanding;Returned demonstration       PT Short Term Goals - 10/22/19 1527      PT SHORT TERM GOAL #1   Title  Pt will demo consistency and independence with her initial HEP to decrease pain and strength in the Lt hand.    Time  2    Period  Weeks    Status  Achieved    Target Date  10/20/19        PT Long Term Goals - 10/22/19 1527      PT LONG TERM GOAL #1   Title  Pt will have greater than 60 deg of active Lt wrist extension and flexion to improve efficiency with daily tasks.    Baseline  65 deg    Time  6    Period  Weeks    Status  Achieved      PT LONG TERM GOAL #2   Title  Pt will have atleast 15lb improvement in her Lt grip strength to increase her ability to use to Lt UE throughout the day.    Baseline  5lb    Time  6    Period  Weeks    Status  Partially Met      PT LONG TERM GOAL #3   Title  Pt will have atleast 20 deg of active Lt finger extension without increase in pain.    Time  6    Period  Weeks    Status  New      PT LONG TERM GOAL #4   Title  Pt will report atleast 60% improvement in function and strength of the Lt hand from the start of PT.    Time  6    Period  Weeks    Status  New            Plan - 11/11/19 1248    Clinical Impression Statement  Pt arrived with new onset Lt wrist pain/swelling. Pt denies specific onset and notes pain with resisted wrist radial deviation. PT focused on soft tissue mobilization of the forearm and brachioradialis/carpi radialis muscles in addition to gentle passive 3rd digit stretching. Pt tends to do well following her PT sessions with increased finger extension and general mobility however there has been issues with carry-over of finger/wrist flexibility  between sessions. She recently noted improvements  in finger extension after sleeping through the night. Pt is diligently completing her HEP, and she can demonstrate proper understanding of her exercises/stretches with PT frequently reviewing this. Pt has been educated on importance of avoiding too much aggressive soft tissue work and stretching at home to allow the tissue to recover appropriately. She would continue to benefit from skilled PT moving forward to address remaining deficits in ROM, strength and functional use of the Lt hand.    Rehab Potential  Good    PT Frequency  2x / week    PT Duration  6 weeks    PT Treatment/Interventions  ADLs/Self Care Home Management;Cryotherapy;Moist Heat;Neuromuscular re-education;Therapeutic exercise;Therapeutic activities;Patient/family education;Dry needling;Manual techniques;Passive range of motion;Taping    PT Next Visit Plan  measure MCP extension; possible d/n thumb; address wrist pronation/supination limitations; active wrist flexion/extension HEP; finger tendon glides; progressions of hand strength/ROM    PT Home Exercise Plan  Access Code: [email or web address]    [email or web address] and Agree with Plan of Care  Patient       Patient will benefit from skilled therapeutic intervention in order to improve the following deficits and impairments:  Pain, Decreased scar mobility, Hypomobility, Decreased strength, Decreased range of motion, Impaired flexibility  Visit Diagnosis: Stiffness of left hand, not elsewhere classified  Stiffness of left wrist, not elsewhere classified  Pain in left hand     Problem List Patient Active Problem List   Diagnosis Date Noted  . Carpal tunnel syndrome, left upper limb 08/11/2019    Class: Chronic  . DJD (degenerative joint disease), cervical 09/18/2016  . Other fatigue 09/18/2016  . Fibromyalgia 09/18/2016  . Primary osteoarthritis of both knees 09/18/2016  . Primary insomnia 09/18/2016  . Vitamin D deficiency 09/18/2016  . Carpal tunnel syndrome, right 09/05/2015     Class: Chronic  . Trigger finger, left middle finger 09/05/2015    Class: Chronic  . Pulmonary nodules 01/16/2013  . Dyspnea 01/13/2013    1:36 PM,11/11/19 Sherol Dade PT, DPT [facility] [address] [place], [place], [address] Phone: [phone]   Fax:  [phone]  Name: Holly [name] MRN: [id number] Date of Birth: 1961/05/11  *Addendum to resolve episode of care and d/c pt  PHYSICAL THERAPY DISCHARGE SUMMARY  Visits from Start of Care: 8  Current functional level related to goals / functional outcomes: See above for more details    Remaining deficits: See above for more details    Education / Equipment: See above for more details   Plan: Patient agrees to discharge.  Patient goals were partially met. Patient is being discharged due to the physician's request.  ?????    Pt is transfering to OT to allow for brace fabrication/fitting and continuation of treatment.   8:56 AM,11/17/19 [place], [place] at [place]

## 2019-11-12 ENCOUNTER — Ambulatory Visit (INDEPENDENT_AMBULATORY_CARE_PROVIDER_SITE_OTHER): Payer: BC Managed Care – PPO | Admitting: Specialist

## 2019-11-12 ENCOUNTER — Other Ambulatory Visit: Payer: Self-pay

## 2019-11-12 ENCOUNTER — Encounter: Payer: Self-pay | Admitting: Specialist

## 2019-11-12 VITALS — BP 122/78 | HR 70 | Ht 65.0 in | Wt 209.4 lb

## 2019-11-12 DIAGNOSIS — M24542 Contracture, left hand: Secondary | ICD-10-CM

## 2019-11-12 DIAGNOSIS — S63502A Unspecified sprain of left wrist, initial encounter: Secondary | ICD-10-CM

## 2019-11-12 DIAGNOSIS — Z9889 Other specified postprocedural states: Secondary | ICD-10-CM

## 2019-11-12 NOTE — Progress Notes (Signed)
Office Visit Note   Patient: Holly [name]           Date of Birth: 1961-04-28           MRN: [id number] Visit Date: 11/12/2019              Requested by: Josetta Huddle, MD [address] [place],  [place] [address] PCP: Josetta Huddle, MD   Assessment & Plan: Visit Diagnoses:  1. Wrist sprain, left, initial encounter   2. Flexion contracture of joint of hand, left     Plan: Will start a formal Occupational therapy program  With iontophoresis left palmar hand and left long finger, stretching and Strengthening  Left wrist and fingers. Try a grass hopper splint for the left long finger PIP joint . Avoid lifting greater than 15 lbs. Use the left wrist carpal tunnel splint as much as tolerated for 4-6 weeks to rest the wrist ligament injury. Follow-Up Instructions: Return in about 3 weeks (around 12/03/2019).   Orders:  No orders of the defined types were placed in this encounter.  No orders of the defined types were placed in this encounter.     Procedures: No procedures performed   Clinical Data: No additional findings.   Subjective: Chief Complaint  Patient presents with  . Left Hand - Follow-up    59 year old female right handed but had used the left hand more than right hand. She underwent left CTR in NOv. 2020 with left long finger  Trigger finger release. Since the surgery she is having persistent pain and swelling in the left long finger with tenderness along the volar left long finger distal palmar crease in the area of the transverse incision. More recently she was pushing up off the desk and felt a pop in the radial volar wrist with immediate pain and tenderness and swelling along the left radial radiocarpal joint.    Review of Systems  Constitutional: Negative.  Negative for activity change, appetite change, chills, diaphoresis, fatigue, fever and unexpected weight change.  HENT: Negative.  Negative for congestion, dental problem, drooling, ear  discharge, ear pain, facial swelling, hearing loss, mouth sores, nosebleeds, postnasal drip, rhinorrhea, sinus pressure, sinus pain, sneezing, sore throat, tinnitus, trouble swallowing and voice change.   Eyes: Negative.  Negative for photophobia, pain, discharge, redness, itching and visual disturbance.  Respiratory: Negative for apnea, cough, choking, chest tightness, shortness of breath, wheezing and stridor.   Cardiovascular: Negative.  Negative for chest pain, palpitations and leg swelling.  Gastrointestinal: Negative.  Negative for abdominal distention, abdominal pain, anal bleeding, blood in stool, constipation, diarrhea, nausea, rectal pain and vomiting.  Endocrine: Negative.  Negative for cold intolerance, heat intolerance, polydipsia, polyphagia and polyuria.  Genitourinary: Negative.  Negative for decreased urine volume, difficulty urinating, dyspareunia, dysuria, enuresis, flank pain, frequency, genital sores, hematuria, menstrual problem, pelvic pain, urgency, vaginal bleeding, vaginal discharge and vaginal pain.  Musculoskeletal: Positive for arthralgias. Negative for back pain, gait problem, joint swelling, myalgias, neck pain and neck stiffness.  Allergic/Immunologic: Negative.  Negative for environmental allergies, food allergies and immunocompromised state.  Neurological: Negative.  Negative for dizziness, tremors, seizures, syncope, facial asymmetry, speech difficulty, weakness, light-headedness, numbness and headaches.  Hematological: Negative for adenopathy. Bruises/bleeds easily.  Psychiatric/Behavioral: Negative.  Negative for agitation, behavioral problems, confusion, decreased concentration, dysphoric mood, hallucinations, self-injury, sleep disturbance and suicidal ideas. The patient is not nervous/anxious and is not hyperactive.      Objective: Vital Signs: BP 122/78 (BP  Location: Left Arm, Patient Position: Sitting)   Pulse 70   Ht 5\' 5"  (1.651 m)   Wt 209 lb 6.4 oz (95  kg)   BMI 34.85 kg/m   Physical Exam Constitutional:      Appearance: She is well-developed.  HENT:     Head: Normocephalic and atraumatic.  Eyes:     Pupils: Pupils are equal, round, and reactive to light.  Pulmonary:     Effort: Pulmonary effort is normal.     Breath sounds: Normal breath sounds.  Abdominal:     General: Bowel sounds are normal.     Palpations: Abdomen is soft.  Musculoskeletal:     Cervical back: Normal range of motion and neck supple.  Skin:    General: Skin is warm and dry.  Neurological:     Mental Status: She is alert and oriented to person, place, and time.  Psychiatric:        Behavior: Behavior normal.        Thought Content: Thought content normal.        Judgment: Judgment normal.     Left Hand Exam   Tenderness  The patient is experiencing tenderness in the palmar area and radial area.   Range of Motion  Wrist  Extension:  45 abnormal  Flexion:  90 normal  Pronation: 90  Supination: 90  Hand  PIP Middle: 20   Muscle Strength  Wrist extension: 5/5  Wrist flexion: 5/5  Grip:  5/5   Tests  Phalen's Sign: negative Tinel's sign (median nerve): negative Finkelstein's test: negative  Other  Erythema: absent Scars: present Sensation: normal Pulse: present      Specialty Comments:  No specialty comments available.  Imaging: No results found.   PMFS History: Patient Active Problem List   Diagnosis Date Noted  . Carpal tunnel syndrome, left upper limb 08/11/2019    Priority: High    Class: Chronic  . Trigger finger, left middle finger 09/05/2015    Priority: High    Class: Chronic  . Carpal tunnel syndrome, right 09/05/2015    Priority: Low    Class: Chronic  . DJD (degenerative joint disease), cervical 09/18/2016  . Other fatigue 09/18/2016  . Fibromyalgia 09/18/2016  . Primary osteoarthritis of both knees 09/18/2016  . Primary insomnia 09/18/2016  . Vitamin D deficiency 09/18/2016  . Pulmonary nodules  01/16/2013  . Dyspnea 01/13/2013   Past Medical History:  Diagnosis Date  . Anxiety    takes Ativan daily  . Back pain    stenosis and buldging disc  . Bone spur    neck and buldging disc  . Depression    takes Cymbalta daily  . Diabetes (HCC)    takes Metformin daily  . Diverticulosis   . GERD (gastroesophageal reflux disease)    takes Prevacid daily  . History of bronchitis    > 5 yrs ago  . History of hiatal hernia   . Hypothyroidism    takes Synthroid daily  . IBS (irritable bowel syndrome)   . Insomnia    doesn't take any meds  . Joint pain   . Lung nodule    right middle lobe-was being followed by Dr.Burney.Medical Md is keeping up with this  . Migraine   . Migraine    last one 08/28/15  . Nocturia   . Numbness and tingling in hands   . PONV (postoperative nausea and vomiting)   . Sleep apnea   . Thoracic outlet syndrome   .  TOS (thoracic outlet syndrome)   . Urinary urgency     Family History  Problem Relation Age of Onset  . COPD Mother   . Cancer Mother   . Heart disease Paternal Grandmother   . Heart disease Paternal Grandfather   . Heart disease Maternal Grandmother   . Heart disease Maternal Grandfather     Past Surgical History:  Procedure Laterality Date  . ABDOMINAL EXPLORATION SURGERY     cancer cells in cervix  . CARPAL TUNNEL RELEASE Right 09/05/2015   Procedure: RIGHT OPEN CARPAL TUNNEL RELEASE, RIGHT LONG FINGER TRIGGER RELEASE,RIGHT INDEX FINGER TRIGGER RELEASE;  Surgeon: Kerrin Champagne, MD;  Location: [facility];  Service: Orthopedics;  Laterality: Right;  . CARPAL TUNNEL RELEASE Left 08/11/2019   Procedure: LEFT OPEN CARPAL TUNNEL RELEASE AND LEFT LONG FINGER TRIGGER FINGER RELEASE;  Surgeon: Kerrin Champagne, MD;  Location: [facility];  Service: Orthopedics;  Laterality: Left;  . CHOLECYSTECTOMY    . COLONOSCOPY    . ESOPHAGOGASTRODUODENOSCOPY    . THYROIDECTOMY    . TONSILLECTOMY     adenoidectomy  . TRIGGER FINGER RELEASE  Right 09/05/2015   Procedure: RELEASE TRIGGER FINGER/A-1 PULLEY;  Surgeon: Kerrin Champagne, MD;  Location: [facility];  Service: Orthopedics;  Laterality: Right;  . TRIGGER FINGER RELEASE Left 08/11/2019   Procedure: RELEASE TRIGGER FINGER/A-1 PULLEY;  Surgeon: Kerrin Champagne, MD;  Location: [facility];  Service: Orthopedics;  Laterality: Left;   Social History   Occupational History  . Not on file  Tobacco Use  . Smoking status: Former Smoker    Types: Cigarettes  . Smokeless tobacco: Never Used  Substance and Sexual Activity  . Alcohol use: No  . Drug use: No  . Sexual activity: Yes    Birth control/protection: Post-menopausal

## 2019-11-17 ENCOUNTER — Encounter: Payer: BC Managed Care – PPO | Admitting: Physical Therapy

## 2019-11-19 ENCOUNTER — Encounter: Payer: BC Managed Care – PPO | Admitting: Physical Therapy

## 2019-11-20 ENCOUNTER — Telehealth: Payer: Self-pay | Admitting: Specialist

## 2019-11-20 NOTE — Telephone Encounter (Signed)
Patient called.   Nobody has contacted her to start OT. She is very concerned as she feels it is critical to her healing post surgery.  Call back number: [phone]

## 2019-11-20 NOTE — Telephone Encounter (Signed)
I called and gave her the number to [facility]

## 2019-11-24 ENCOUNTER — Other Ambulatory Visit: Payer: Self-pay

## 2019-11-24 ENCOUNTER — Encounter: Payer: Self-pay | Admitting: Occupational Therapy

## 2019-11-24 ENCOUNTER — Ambulatory Visit: Payer: BC Managed Care – PPO | Admitting: Occupational Therapy

## 2019-11-24 DIAGNOSIS — R600 Localized edema: Secondary | ICD-10-CM | POA: Diagnosis not present

## 2019-11-24 DIAGNOSIS — M25632 Stiffness of left wrist, not elsewhere classified: Secondary | ICD-10-CM

## 2019-11-24 DIAGNOSIS — M25642 Stiffness of left hand, not elsewhere classified: Secondary | ICD-10-CM | POA: Diagnosis not present

## 2019-11-24 DIAGNOSIS — M79642 Pain in left hand: Secondary | ICD-10-CM | POA: Diagnosis not present

## 2019-11-24 NOTE — Therapy (Signed)
[facility] [address] [place], [place], [address] Phone: [phone]   Fax:  [phone]  Occupational Therapy Evaluation  Patient Details  Name: Holly [name] MRN: [id number] Date of Birth: 06-Mar-1961 Referring Provider (OT): Dr Otelia Sergeant   Encounter Date: 11/24/2019  OT End of Session - 11/24/19 1629    Visit Number  1    Number of Visits  13    Date for OT Re-Evaluation  01/08/20    Authorization Type  [employer] MCR  VL:[id number]    OT Start Time  1510    OT Stop Time  1615    OT Time Calculation (min)  65 min    Activity Tolerance  Patient tolerated treatment well    Behavior During Therapy  [facility] for tasks assessed/performed       Past Medical History:  Diagnosis Date  . Anxiety    takes Ativan daily  . Back pain    stenosis and buldging disc  . Bone spur    neck and buldging disc  . Depression    takes Cymbalta daily  . Diabetes (HCC)    takes Metformin daily  . Diverticulosis   . GERD (gastroesophageal reflux disease)    takes Prevacid daily  . History of bronchitis    > 5 yrs ago  . History of hiatal hernia   . Hypothyroidism    takes Synthroid daily  . IBS (irritable bowel syndrome)   . Insomnia    doesn't take any meds  . Joint pain   . Lung nodule    right middle lobe-was being followed by Dr.Burney.Medical Md is keeping up with this  . Migraine   . Migraine    last one 08/28/15  . Nocturia   . Numbness and tingling in hands   . PONV (postoperative nausea and vomiting)   . Sleep apnea   . Thoracic outlet syndrome   . TOS (thoracic outlet syndrome)   . Urinary urgency     Past Surgical History:  Procedure Laterality Date  . ABDOMINAL EXPLORATION SURGERY     cancer cells in cervix  . CARPAL TUNNEL RELEASE Right 09/05/2015   Procedure: RIGHT OPEN CARPAL TUNNEL RELEASE, RIGHT LONG FINGER TRIGGER RELEASE,RIGHT INDEX FINGER TRIGGER RELEASE;  Surgeon: Kerrin Champagne, MD;  Location: [facility];   Service: Orthopedics;  Laterality: Right;  . CARPAL TUNNEL RELEASE Left 08/11/2019   Procedure: LEFT OPEN CARPAL TUNNEL RELEASE AND LEFT LONG FINGER TRIGGER FINGER RELEASE;  Surgeon: Kerrin Champagne, MD;  Location: [facility];  Service: Orthopedics;  Laterality: Left;  . CHOLECYSTECTOMY    . COLONOSCOPY    . ESOPHAGOGASTRODUODENOSCOPY    . THYROIDECTOMY    . TONSILLECTOMY     adenoidectomy  . TRIGGER FINGER RELEASE Right 09/05/2015   Procedure: RELEASE TRIGGER FINGER/A-1 PULLEY;  Surgeon: Kerrin Champagne, MD;  Location: [facility];  Service: Orthopedics;  Laterality: Right;  . TRIGGER FINGER RELEASE Left 08/11/2019   Procedure: RELEASE TRIGGER FINGER/A-1 PULLEY;  Surgeon: Kerrin Champagne, MD;  Location: [facility];  Service: Orthopedics;  Laterality: Left;    There were no vitals filed for this visit.  Subjective Assessment - 11/24/19 1622    Pertinent History  DM, Fibromyalgia, Chronic migraine, anxiety    Currently in Pain?  Yes    Pain Score  4     Pain Location  Finger (Comment which one)   left long   Pain Orientation  Left  Pain Descriptors / Indicators  Tightness;Sharp;Shooting;Sore    Pain Type  Chronic pain    Pain Radiating Towards  NA    Pain Onset  More than a month ago    Pain Frequency  Constant    Aggravating Factors   Stiff in am, overuse/repetitive movement    Pain Relieving Factors  heat        [facility] OT Assessment - 11/24/19 0001      Assessment   Medical Diagnosis  Carpal tunnel and trigger finger release    Referring Provider (OT)  Dr Otelia Sergeant    Onset Date/Surgical Date  08/11/19    Hand Dominance  Right    Next MD Visit  12/10/19    Prior Therapy  8 visits for PT ending 11/10/18      Precautions   Precautions  Other (comment)    Precaution Comments  No Lifting more than 10 lbs      Prior Function   Level of Independence  Independent with basic ADLs    Vocation  On disability   migraines   Leisure  church, watch TV, coin  collecting      ADL   Eating/Feeding  Independent    Grooming  Independent    Upper Body Bathing  Modified independent   uses long handled brush   Lower Body Bathing  Modified independent   grab bar   Upper Body Dressing  Minimal assistance    Lower Body Dressing  Increased time    Toilet Transfer  Independent    Toileting - Clothing Manipulation  Independent    Toileting -  Hygiene  Independent    Tub/Shower Transfer  Modified independent    Equipment Used  Long-handled sponge      IADL   Prior Level of Function Light Housekeeping  independent    Light Housekeeping  Performs light daily tasks such as dishwashing, bed making    Prior Level of Function Meal Prep  independent    Meal Prep  --   can do basics - slow     Written Expression   Dominant Hand  Right   cut off thumb R hand as a child, became more left handed     Sensation   Light Touch  Appears Intact    Stereognosis  Not tested    Hot/Cold  Not tested    Proprioception  Not tested      Coordination   Gross Motor Movements are Fluid and Coordinated  No    Fine Motor Movements are Fluid and Coordinated  No    Coordination and Movement Description  slow deliberate end range motion    9 Hole Peg Test  Right;Left    Right 9 Hole Peg Test  30.07    Left 9 Hole Peg Test  34.44      ROM / Strength   AROM / PROM / Strength  AROM;Strength      AROM   Overall AROM   Deficits    Overall AROM Comments  limited end range internal rotation, flex, abd in BUE    AROM Assessment Site  Wrist    Right/Left Wrist  Left    Left Wrist Extension  50 Degrees    Left Wrist Flexion  65 Degrees      Strength   Overall Strength  Deficits    Overall Strength Comments  Decreased ability to sustain contraction aainst resistance with left wrist extension      Hand Function  Right Hand Gross Grasp  Functional    Right Hand Grip (lbs)  70    Right Hand Lateral Pinch  16 lbs    Right Hand 3 Point Pinch  20 lbs    Left Hand Gross  Grasp  Impaired    Left Hand Grip (lbs)  20    Left Hand Lateral Pinch  12 lbs    Left 3 point pinch  10 lbs                      OT Education - 11/24/19 1628    Education Details  issued PIP Ext assist splint for long finger left hand.  Patient tolerated during session.  Encouraged her to wear for only 30 min at a time to ensure she could tolerate - if tolerates - can increase wear time.    Person(s) Educated  Patient    Methods  Explanation;Demonstration    Comprehension  Verbalized understanding       OT [name] Term Goals - 11/24/19 1637      OT [name] TERM GOAL #1   Title  Patient will complete a home exercise program designed to improve strength in left hand    Time  4    Period  Weeks    Status  New      OT [name] TERM GOAL #2   Title  Pt to id pain management strategies for hands including modalities, stretches, exercises and scar massage/management    Time  4    Period  Weeks    Status  New      OT [name] TERM GOAL #3   Title  Patient will demonstrate improved use of left hand strength to adjust bra once hooked    Baseline  xxx    Time  4    Period  Weeks    Status  New      OT [name] TERM GOAL #4   Title  Patient will report pain level at 3 or less with functional use of hand in ADL activity    Baseline  xxx    Time  4    Period  Weeks    Status  New        OT Long Term Goals - 11/24/19 1651      OT LONG TERM GOAL #1   Title  Patient will demonstrate independence with HEP for BUE    Time  6    Period  Weeks    Status  New    Target Date  01/08/20      OT LONG TERM GOAL #2   Title  Patient will demonstrate improved active wrist extension to 65 degrees to assist with use of left hand as support    Time  6    Period  Weeks    Status  New      OT LONG TERM GOAL #3   Title  Patient will demonstrate 2 lb increase in lateral pinch strength to help her picking up small objects.    Baseline  12    Time  6    Period  Weeks    Status  New             Plan - 11/24/19 1631    Clinical Impression Statement  Patient is a 59 year old woman with history of recent carpal tunnel and trigger finger release 08/11/19, with wrist sprain Feb 2021.Marland Kitchen  Patient has recently completed course of  PT s/p surgery, and is now being referred to OT for splinting, ionto, and follow up therapy to address pain, range of motion, and and improve her participation in ADL.  Patient wil benefit from skilled OT intervention and she is eager for decreased pain and increased strength and functional use of her left hand.    OT Occupational Profile and History  Problem Focused Assessment - Including review of records relating to presenting problem    Occupational performance deficits (Please refer to evaluation for details):  ADL's;IADL's;Rest and Sleep    Body Structure / Function / Physical Skills  ADL;Dexterity;Strength;Pain;Edema;UE functional use;IADL;ROM;Scar mobility;Fascial restriction;Coordination    Rehab Potential  Good    Clinical Decision Making  Limited treatment options, no task modification necessary    Comorbidities Affecting Occupational Performance:  May have comorbidities impacting occupational performance   chronic migraine, fibromyalgia, DM   Modification or Assistance to Complete Evaluation   No modification of tasks or assist necessary to complete eval    OT Frequency  2x / week    OT Duration  6 weeks    OT Treatment/Interventions  Self-care/ADL training;Moist Heat;Fluidtherapy;DME and/or AE instruction;Splinting;Contrast Bath;Compression bandaging;Therapeutic activities;Scar mobilization;Therapeutic exercise;Ultrasound;Iontophoresis;Cryotherapy;Patient/family education;Manual Therapy;Paraffin;Electrical Stimulation    Plan  Please assess PIP ext assist splint tolerance, order for iontophoresis, ultrasound to address adhesions at base of hand/ base of 2/3 digit, question need for night splint to promote extension    Consulted and Agree with Plan  of Care  Patient       Patient will benefit from skilled therapeutic intervention in order to improve the following deficits and impairments:   Body Structure / Function / Physical Skills: ADL, Dexterity, Strength, Pain, Edema, UE functional use, IADL, ROM, Scar mobility, Fascial restriction, Coordination       Visit Diagnosis: Stiffness of left hand, not elsewhere classified - Plan: Ot plan of care cert/re-cert  Stiffness of left wrist, not elsewhere classified - Plan: Ot plan of care cert/re-cert  Pain in left hand - Plan: Ot plan of care cert/re-cert  Localized edema - Plan: Ot plan of care cert/re-cert    Problem List Patient Active Problem List   Diagnosis Date Noted  . Carpal tunnel syndrome, left upper limb 08/11/2019    Class: Chronic  . DJD (degenerative joint disease), cervical 09/18/2016  . Other fatigue 09/18/2016  . Fibromyalgia 09/18/2016  . Primary osteoarthritis of both knees 09/18/2016  . Primary insomnia 09/18/2016  . Vitamin D deficiency 09/18/2016  . Carpal tunnel syndrome, right 09/05/2015    Class: Chronic  . Trigger finger, left middle finger 09/05/2015    Class: Chronic  . Pulmonary nodules 01/16/2013  . Dyspnea 01/13/2013    Mariah Milling, OTR/L 11/24/2019, 4:56 PM  [facility] [address] [place], [place], [address] Phone: [phone]   Fax:  [phone]  Name: Holly [name] MRN: [id number] Date of Birth: 1960/10/23

## 2019-11-25 ENCOUNTER — Ambulatory Visit: Payer: BC Managed Care – PPO | Admitting: Occupational Therapy

## 2019-11-25 ENCOUNTER — Other Ambulatory Visit: Payer: Self-pay

## 2019-11-25 DIAGNOSIS — M25632 Stiffness of left wrist, not elsewhere classified: Secondary | ICD-10-CM

## 2019-11-25 DIAGNOSIS — M25642 Stiffness of left hand, not elsewhere classified: Secondary | ICD-10-CM | POA: Diagnosis not present

## 2019-11-25 DIAGNOSIS — R600 Localized edema: Secondary | ICD-10-CM | POA: Diagnosis not present

## 2019-11-25 DIAGNOSIS — M79642 Pain in left hand: Secondary | ICD-10-CM | POA: Diagnosis not present

## 2019-11-25 NOTE — Patient Instructions (Signed)
Flexor Tendon Gliding (Active Hook Fist)   With fingers and knuckles straight, bend middle and tip joints. Do not bend large knuckles. Repeat _10-15___ times. Do _4-6___ sessions per day.  MP Flexion (Active)   With back of hand on table, bend large knuckles as far as they will go, keeping small joints straight. Repeat _10-15___ times. Do __4-6__ sessions per day. Activity: Reach into a narrow container.*      Finger Flexion / Extension   With palm up, bend fingers of left hand toward palm, making a  fist. Straighten fingers, opening fist. Repeat sequence _10-15___ times per session. Do _4-6__ sessions per day. Hand Variation: Palm down   Copyright  VHI. All rights reserved.  MP Flexion (Active Isolated)   Bend _all_____ fingers at large knuckle, keeping other fingers straight. Do not bend tips. Repeat _10-15___ times. Do __2-3_ sessions per day.  AROM: PIP Flexion / Extension   Pinch bottom knuckle of ___middle_____ finger of hand to prevent bending. Actively bend middle knuckle until stretch is felt. Hold __5__ seconds. Relax. Straighten finger as far as possible. Repeat __10-15__ times per set. Do _2-3___ sessions per day.   AROM: DIP Flexion / Extension   Pinch middle knuckle of ___middle_____ finger of  hand to prevent bending. Bend end knuckle until stretch is felt. Hold _5___ seconds. Relax. Straighten finger as far as possible. Repeat _10-15___ times per set.  Do _2-3__ sessions per day.  AROM: Finger Flexion / Extension   Actively bend fingers of  hand. Start with knuckles furthest from palm, and slowly make a fist. Hold __5__ seconds. Relax. Then straighten fingers as far as possible. Repeat _10-15___ times per set.  Do _4-6___ sessions per day.  Copyright  VHI. All rights reserved.    Wear middle finger extension splint for 10-15 mins 1-2 x day, remove if any severe pain/ pressure areas or numbness. Do not sleep in finger splint, bring splint with you  to next visit.

## 2019-11-25 NOTE — Therapy (Signed)
[facility] [address] [place], [place], [address] Phone: [phone]   Fax:  [phone]  Occupational Therapy Treatment  Patient Details  Name: Holly [name] MRN: [id number] Date of Birth: [birth year] Referring Provider (OT): Dr Louanne Skye   Encounter Date: 11/25/2019  OT End of Session - 11/25/19 1538    Visit Number  2    Number of Visits  13    Date for OT Re-Evaluation  01/08/20    Authorization Type  [employer] MCR  VL:MN    OT Start Time  1151    OT Stop Time  1232    OT Time Calculation (min)  41 min    Activity Tolerance  Patient tolerated treatment well    Behavior During Therapy  [facility] for tasks assessed/performed       Past Medical History:  Diagnosis Date  . Anxiety    takes Ativan daily  . Back pain    stenosis and buldging disc  . Bone spur    neck and buldging disc  . Depression    takes Cymbalta daily  . Diabetes ([place])    takes Metformin daily  . Diverticulosis   . GERD (gastroesophageal reflux disease)    takes Prevacid daily  . History of bronchitis    > 5 yrs ago  . History of hiatal hernia   . Hypothyroidism    takes Synthroid daily  . IBS (irritable bowel syndrome)   . Insomnia    doesn't take any meds  . Joint pain   . Lung nodule    right middle lobe-was being followed by Dr.Burney.Medical Md is keeping up with this  . Migraine   . Migraine    last one 08/28/15  . Nocturia   . Numbness and tingling in hands   . PONV (postoperative nausea and vomiting)   . Sleep apnea   . Thoracic outlet syndrome   . TOS (thoracic outlet syndrome)   . Urinary urgency     Past Surgical History:  Procedure Laterality Date  . ABDOMINAL EXPLORATION SURGERY     cancer cells in cervix  . CARPAL TUNNEL RELEASE Right 09/05/2015   Procedure: RIGHT OPEN CARPAL TUNNEL RELEASE, RIGHT LONG FINGER TRIGGER RELEASE,RIGHT INDEX FINGER TRIGGER RELEASE;  Surgeon: Jessy Oto, MD;  Location: [place];  Service:  Orthopedics;  Laterality: Right;  . CARPAL TUNNEL RELEASE Left 08/11/2019   Procedure: LEFT OPEN CARPAL TUNNEL RELEASE AND LEFT LONG FINGER TRIGGER FINGER RELEASE;  Surgeon: Jessy Oto, MD;  Location: [facility];  Service: Orthopedics;  Laterality: Left;  . CHOLECYSTECTOMY    . COLONOSCOPY    . ESOPHAGOGASTRODUODENOSCOPY    . THYROIDECTOMY    . TONSILLECTOMY     adenoidectomy  . TRIGGER FINGER RELEASE Right 09/05/2015   Procedure: RELEASE TRIGGER FINGER/A-1 PULLEY;  Surgeon: Jessy Oto, MD;  Location: [facility];  Service: Orthopedics;  Laterality: Right;  . TRIGGER FINGER RELEASE Left 08/11/2019   Procedure: RELEASE TRIGGER FINGER/A-1 PULLEY;  Surgeon: Jessy Oto, MD;  Location: [facility];  Service: Orthopedics;  Laterality: Left;    There were no vitals filed for this visit.  Subjective Assessment - 11/25/19 1154    Subjective   Pt reports that she had pain after wearing finger splint x 30 mins, pt was instructed to reduce wear time to 10-15 mins 1-2 x day.    Pertinent History  DM, Fibromyalgia, Chronic migraine, anxiety    Currently  in Pain?  Yes    Pain Score  4     Pain Location  Hand    Pain Orientation  Left    Pain Descriptors / Indicators  Aching    Pain Type  Chronic pain    Pain Onset  More than a month ago    Pain Frequency  Intermittent    Aggravating Factors   overuse    Pain Relieving Factors  rest              Treatment: [place] , 0.8 w/cm 2 to palm (at base of middle finger, thenar eminence, palmar scar site  and middle finger) x 8 mins no adverse reactions.  Scar mobilization/ soft tissue mobs to palm and middle finger.  Pt was instructed in initial HEP, see pt instructions. Gentle passive extension to middle finger for PIP and DIP joints as tolerated. Fluidotherapy x 9 mins with pt performing A/ROM, no adverse reactions. Pt reports decreased pain at end of session.             OT Education - 11/25/19 1536     Education Details  intial HEP- see pt instructions, 10-15 reps each, pt was instructed to reduce finger splint wear time to 10-15 mins 1-2 x day and to stop wearing if any issues.    Person(s) Educated  Patient    Methods  Explanation;Demonstration;Verbal cues;Handout    Comprehension  Verbalized understanding;Returned demonstration;Verbal cues required       OT Short Term Goals - 11/24/19 1637      OT SHORT TERM GOAL #1   Title  Patient will complete a home exercise program designed to improve strength in left hand    Time  4    Period  Weeks    Status  New      OT SHORT TERM GOAL #2   Title  Pt to id pain management strategies for hands including modalities, stretches, exercises and scar massage/management    Time  4    Period  Weeks    Status  New      OT SHORT TERM GOAL #3   Title  Patient will demonstrate improved use of left hand strength to adjust bra once hooked    Baseline  xxx    Time  4    Period  Weeks    Status  New      OT SHORT TERM GOAL #4   Title  Patient will report pain level at 3 or less with functional use of hand in ADL activity    Baseline  xxx    Time  4    Period  Weeks    Status  New        OT Long Term Goals - 11/24/19 1651      OT LONG TERM GOAL #1   Title  Patient will demonstrate independence with HEP for BUE    Time  6    Period  Weeks    Status  New    Target Date  01/08/20      OT LONG TERM GOAL #2   Title  Patient will demonstrate improved active wrist extension to 65 degrees to assist with use of left hand as support    Time  6    Period  Weeks    Status  New      OT LONG TERM GOAL #3   Title  Patient will demonstrate 2 lb increase in lateral pinch strength to help  her picking up small objects.    Baseline  12    Time  6    Period  Weeks    Status  New            Plan - 11/25/19 1231    Clinical Impression Statement  Pt is progressing towards goals. She demonstrates improving flexibility after ultrasound and  scar massage.    OT Occupational Profile and History  Problem Focused Assessment - Including review of records relating to presenting problem    Occupational performance deficits (Please refer to evaluation for details):  ADL's;IADL's;Rest and Sleep    Body Structure / Function / Physical Skills  ADL;Dexterity;Strength;Pain;Edema;UE functional use;IADL;ROM;Scar mobility;Fascial restriction;Coordination    Rehab Potential  Good    Clinical Decision Making  Limited treatment options, no task modification necessary    Comorbidities Affecting Occupational Performance:  May have comorbidities impacting occupational performance   chronic migraine, fibromyalgia, DM   Modification or Assistance to Complete Evaluation   No modification of tasks or assist necessary to complete eval    OT Frequency  2x / week    OT Duration  6 weeks    OT Treatment/Interventions  Self-care/ADL training;Moist Heat;Fluidtherapy;DME and/or AE instruction;Splinting;Contrast Bath;Compression bandaging;Therapeutic activities;Scar mobilization;Therapeutic exercise;Ultrasound;Iontophoresis;Cryotherapy;Patient/family education;Manual Therapy;Paraffin;Electrical Stimulation    Plan  fluidotherapy as warm up,  ultrasound(pulsed) to address adhesions at base of hand/ base of 2/3 digit, check on splint wear schedule, continue ROM- assess need for ionto    Consulted and Agree with Plan of Care  Patient       Patient will benefit from skilled therapeutic intervention in order to improve the following deficits and impairments:   Body Structure / Function / Physical Skills: ADL, Dexterity, Strength, Pain, Edema, UE functional use, IADL, ROM, Scar mobility, Fascial restriction, Coordination       Visit Diagnosis: Stiffness of left hand, not elsewhere classified  Stiffness of left wrist, not elsewhere classified  Pain in left hand  Localized edema    Problem List Patient Active Problem List   Diagnosis Date Noted  . Carpal  tunnel syndrome, left upper limb 08/11/2019    Class: Chronic  . DJD (degenerative joint disease), cervical 09/18/2016  . Other fatigue 09/18/2016  . Fibromyalgia 09/18/2016  . Primary osteoarthritis of both knees 09/18/2016  . Primary insomnia 09/18/2016  . Vitamin D deficiency 09/18/2016  . Carpal tunnel syndrome, right 09/05/2015    Class: Chronic  . Trigger finger, left middle finger 09/05/2015    Class: Chronic  . Pulmonary nodules 01/16/2013  . Dyspnea 01/13/2013    Kenyah Luba 11/25/2019, 3:44 PM  [facility] [address] [place], [place], [address] Phone: [phone]   Fax:  [phone]  Name: Holly [name] MRN: [id number] Date of Birth: [birth year]

## 2019-11-30 ENCOUNTER — Encounter: Payer: Self-pay | Admitting: Occupational Therapy

## 2019-11-30 ENCOUNTER — Ambulatory Visit: Payer: BC Managed Care – PPO

## 2019-11-30 ENCOUNTER — Ambulatory Visit: Payer: BC Managed Care – PPO | Attending: Specialist | Admitting: Occupational Therapy

## 2019-11-30 ENCOUNTER — Other Ambulatory Visit: Payer: Self-pay

## 2019-11-30 DIAGNOSIS — M25642 Stiffness of left hand, not elsewhere classified: Secondary | ICD-10-CM | POA: Insufficient documentation

## 2019-11-30 DIAGNOSIS — M79642 Pain in left hand: Secondary | ICD-10-CM | POA: Diagnosis not present

## 2019-11-30 DIAGNOSIS — R600 Localized edema: Secondary | ICD-10-CM | POA: Insufficient documentation

## 2019-11-30 DIAGNOSIS — M25632 Stiffness of left wrist, not elsewhere classified: Secondary | ICD-10-CM | POA: Diagnosis not present

## 2019-11-30 NOTE — Therapy (Signed)
[facility] [address] [place], [place], [address] Phone: [phone]   Fax:  [phone]  Occupational Therapy Treatment  Patient Details  Name: Holly [name] MRN: [id number] Date of Birth: [birth year] Referring Provider (OT): Dr Otelia Sergeant   Encounter Date: 11/30/2019  OT End of Session - 11/30/19 1812    Visit Number  3    Number of Visits  13    Date for OT Re-Evaluation  01/08/20    Authorization Type  [employer] MCR  VL:MN    OT Start Time  1615    OT Stop Time  1700    OT Time Calculation (min)  45 min    Activity Tolerance  Patient tolerated treatment well    Behavior During Therapy  [facility] for tasks assessed/performed       Past Medical History:  Diagnosis Date  . Anxiety    takes Ativan daily  . Back pain    stenosis and buldging disc  . Bone spur    neck and buldging disc  . Depression    takes Cymbalta daily  . Diabetes ([facility])    takes Metformin daily  . Diverticulosis   . GERD (gastroesophageal reflux disease)    takes Prevacid daily  . History of bronchitis    > 5 yrs ago  . History of hiatal hernia   . Hypothyroidism    takes Synthroid daily  . IBS (irritable bowel syndrome)   . Insomnia    doesn't take any meds  . Joint pain   . Lung nodule    right middle lobe-was being followed by Dr.Burney.Medical Md is keeping up with this  . Migraine   . Migraine    last one 08/28/15  . Nocturia   . Numbness and tingling in hands   . PONV (postoperative nausea and vomiting)   . Sleep apnea   . Thoracic outlet syndrome   . TOS (thoracic outlet syndrome)   . Urinary urgency     Past Surgical History:  Procedure Laterality Date  . ABDOMINAL EXPLORATION SURGERY     cancer cells in cervix  . CARPAL TUNNEL RELEASE Right 09/05/2015   Procedure: RIGHT OPEN CARPAL TUNNEL RELEASE, RIGHT LONG FINGER TRIGGER RELEASE,RIGHT INDEX FINGER TRIGGER RELEASE;  Surgeon: Kerrin Champagne, MD;  [facility];  Service:  Orthopedics;  Laterality: Right;  . CARPAL TUNNEL RELEASE Left 08/11/2019   Procedure: LEFT OPEN CARPAL TUNNEL RELEASE AND LEFT LONG FINGER TRIGGER FINGER RELEASE;  Surgeon: Kerrin Champagne, MD;  Location: [facility];  Service: Orthopedics;  Laterality: Left;  . CHOLECYSTECTOMY    . COLONOSCOPY    . ESOPHAGOGASTRODUODENOSCOPY    . THYROIDECTOMY    . TONSILLECTOMY     adenoidectomy  . TRIGGER FINGER RELEASE Right 09/05/2015   Procedure: RELEASE TRIGGER FINGER/A-1 PULLEY;  Surgeon: Kerrin Champagne, MD;  [facility];  Service: Orthopedics;  Laterality: Right;  . TRIGGER FINGER RELEASE Left 08/11/2019   Procedure: RELEASE TRIGGER FINGER/A-1 PULLEY;  Surgeon: Kerrin Champagne, MD;  Location: [facility];  Service: Orthopedics;  Laterality: Left;    There were no vitals filed for this visit.  Subjective Assessment - 11/30/19 1633    Subjective   Patient reports that she felt really good after last OT session - feels Ultrasound is effective    Pertinent History  DM, Fibromyalgia, Chronic migraine, anxiety    Currently in Pain?  Yes    Pain Score  3     Pain Location  Hand    Pain Orientation  Left    Pain Descriptors / Indicators  Aching    Pain Type  Chronic pain    Pain Onset  More than a month ago    Pain Frequency  Intermittent    Aggravating Factors   overuse    Pain Relieving Factors  rest                   OT Treatments/Exercises (OP) - 11/30/19 0001      Hand Exercises   Other Hand Exercises  Reviewed blocking exercise for PIP/DIP extension, long finger      Modalities   Modalities  Ultrasound      Ultrasound   Ultrasound Location  palm of hand, volar surface long finger    Ultrasound Parameters  , continuous, 0.8w/cm2,      LUE Fluidotherapy   Number Minutes Fluidotherapy  10 Minutes    LUE Fluidotherapy Location  Hand;Wrist      Splinting   Splinting  Patient tolerating PIP Ext assist splint, added Oval 8 splint for  DIP to wear as exercise splint.               OT Education - 11/30/19 1811    Education Details  oval 8 splint for exercise DIP, wearing schedule for PIP ext assist splint.  Importance of balancing rest and activity to not exacerbate symptoms    Person(s) Educated  Patient    Methods  Explanation;Demonstration    Comprehension  Verbalized understanding       OT Short Term Goals - 11/24/19 1637      OT SHORT TERM GOAL #1   Title  Patient will complete a home exercise program designed to improve strength in left hand    Time  4    Period  Weeks    Status  New      OT SHORT TERM GOAL #2   Title  Pt to id pain management strategies for hands including modalities, stretches, exercises and scar massage/management    Time  4    Period  Weeks    Status  New      OT SHORT TERM GOAL #3   Title  Patient will demonstrate improved use of left hand strength to adjust bra once hooked    Baseline  xxx    Time  4    Period  Weeks    Status  New      OT SHORT TERM GOAL #4   Title  Patient will report pain level at 3 or less with functional use of hand in ADL activity    Baseline  xxx    Time  4    Period  Weeks    Status  New        OT Long Term Goals - 11/24/19 1651      OT LONG TERM GOAL #1   Title  Patient will demonstrate independence with HEP for BUE    Time  6    Period  Weeks    Status  New    Target Date  01/08/20      OT LONG TERM GOAL #2   Title  Patient will demonstrate improved active wrist extension to 65 degrees to assist with use of left hand as support    Time  6    Period  Weeks    Status  New  OT LONG TERM GOAL #3   Title  Patient will demonstrate 2 lb increase in lateral pinch strength to help her picking up small objects.    Baseline  12    Time  6    Period  Weeks    Status  New            Plan - 11/30/19 1812    Clinical Impression Statement  Patient responding well to modalities, and splinting.  Porgressing toward goals.    OT  Frequency  2x / week    OT Duration  6 weeks    OT Treatment/Interventions  Self-care/ADL training;Moist Heat;Fluidtherapy;DME and/or AE instruction;Splinting;Contrast Bath;Compression bandaging;Therapeutic activities;Scar mobilization;Therapeutic exercise;Ultrasound;Iontophoresis;Cryotherapy;Patient/family education;Manual Therapy;Paraffin;Electrical Stimulation    Plan  fluidotherapy as warm up,  ultrasound(pulsed) to address adhesions at base of hand/ base of 2/3 digit, check on splint wear schedule, continue ROM- assess need for ionto       Patient will benefit from skilled therapeutic intervention in order to improve the following deficits and impairments:           Visit Diagnosis: Stiffness of left hand, not elsewhere classified  Stiffness of left wrist, not elsewhere classified  Pain in left hand  Localized edema    Problem List Patient Active Problem List   Diagnosis Date Noted  . Carpal tunnel syndrome, left upper limb 08/11/2019    Class: Chronic  . DJD (degenerative joint disease), cervical 09/18/2016  . Other fatigue 09/18/2016  . Fibromyalgia 09/18/2016  . Primary osteoarthritis of both knees 09/18/2016  . Primary insomnia 09/18/2016  . Vitamin D deficiency 09/18/2016  . Carpal tunnel syndrome, right 09/05/2015    Class: Chronic  . Trigger finger, left middle finger 09/05/2015    Class: Chronic  . Pulmonary nodules 01/16/2013  . Dyspnea 01/13/2013    Mariah Milling, OTR/L 11/30/2019, 6:15 PM  [facility] [address] [place], [place], [address] Phone: [phone]   Fax:  [phone]  Name: Holly [name] MRN: [id number] Date of Birth: 1961-09-21

## 2019-12-02 ENCOUNTER — Ambulatory Visit: Payer: BC Managed Care – PPO | Admitting: Occupational Therapy

## 2019-12-07 ENCOUNTER — Encounter: Payer: Self-pay | Admitting: Occupational Therapy

## 2019-12-07 ENCOUNTER — Ambulatory Visit: Payer: BC Managed Care – PPO | Admitting: Occupational Therapy

## 2019-12-07 ENCOUNTER — Other Ambulatory Visit: Payer: Self-pay

## 2019-12-07 DIAGNOSIS — M25632 Stiffness of left wrist, not elsewhere classified: Secondary | ICD-10-CM

## 2019-12-07 DIAGNOSIS — M79642 Pain in left hand: Secondary | ICD-10-CM

## 2019-12-07 DIAGNOSIS — M25642 Stiffness of left hand, not elsewhere classified: Secondary | ICD-10-CM | POA: Diagnosis not present

## 2019-12-07 DIAGNOSIS — R600 Localized edema: Secondary | ICD-10-CM

## 2019-12-07 DIAGNOSIS — H524 Presbyopia: Secondary | ICD-10-CM | POA: Diagnosis not present

## 2019-12-07 DIAGNOSIS — H2513 Age-related nuclear cataract, bilateral: Secondary | ICD-10-CM | POA: Diagnosis not present

## 2019-12-07 DIAGNOSIS — H5213 Myopia, bilateral: Secondary | ICD-10-CM | POA: Diagnosis not present

## 2019-12-07 DIAGNOSIS — E119 Type 2 diabetes mellitus without complications: Secondary | ICD-10-CM | POA: Diagnosis not present

## 2019-12-07 NOTE — Therapy (Signed)
[facility] [address] [place], [place], [address] Phone: [phone]   Fax:  [phone]  Occupational Therapy Treatment  Patient Details  Name: Holly [name] MRN: [id number] Date of Birth: 06-08-1961 Referring Provider (OT): Dr Otelia Sergeant   Encounter Date: 12/07/2019  OT End of Session - 12/07/19 1832    Visit Number  4    Number of Visits  13    Date for OT Re-Evaluation  01/08/20    Authorization Type  [employer] MCR  VL:MN    OT Start Time  1618    OT Stop Time  1702    OT Time Calculation (min)  44 min    Activity Tolerance  Patient tolerated treatment well    Behavior During Therapy  [facility] for tasks assessed/performed       Past Medical History:  Diagnosis Date  . Anxiety    takes Ativan daily  . Back pain    stenosis and buldging disc  . Bone spur    neck and buldging disc  . Depression    takes Cymbalta daily  . Diabetes ([facility])    takes Metformin daily  . Diverticulosis   . GERD (gastroesophageal reflux disease)    takes Prevacid daily  . History of bronchitis    > 5 yrs ago  . History of hiatal hernia   . Hypothyroidism    takes Synthroid daily  . IBS (irritable bowel syndrome)   . Insomnia    doesn't take any meds  . Joint pain   . Lung nodule    right middle lobe-was being followed by Dr.Burney.Medical Md is keeping up with this  . Migraine   . Migraine    last one 08/28/15  . Nocturia   . Numbness and tingling in hands   . PONV (postoperative nausea and vomiting)   . Sleep apnea   . Thoracic outlet syndrome   . TOS (thoracic outlet syndrome)   . Urinary urgency     Past Surgical History:  Procedure Laterality Date  . ABDOMINAL EXPLORATION SURGERY     cancer cells in cervix  . CARPAL TUNNEL RELEASE Right 09/05/2015   Procedure: RIGHT OPEN CARPAL TUNNEL RELEASE, RIGHT LONG FINGER TRIGGER RELEASE,RIGHT INDEX FINGER TRIGGER RELEASE;  Surgeon: Kerrin Champagne, MD;  [facility];  Service:  Orthopedics;  Laterality: Right;  . CARPAL TUNNEL RELEASE Left 08/11/2019   Procedure: LEFT OPEN CARPAL TUNNEL RELEASE AND LEFT LONG FINGER TRIGGER FINGER RELEASE;  Surgeon: Kerrin Champagne, MD;  Location: [facility];  Service: Orthopedics;  Laterality: Left;  . CHOLECYSTECTOMY    . COLONOSCOPY    . ESOPHAGOGASTRODUODENOSCOPY    . THYROIDECTOMY    . TONSILLECTOMY     adenoidectomy  . TRIGGER FINGER RELEASE Right 09/05/2015   Procedure: RELEASE TRIGGER FINGER/A-1 PULLEY;  Surgeon: Kerrin Champagne, MD;  [facility];  Service: Orthopedics;  Laterality: Right;  . TRIGGER FINGER RELEASE Left 08/11/2019   Procedure: RELEASE TRIGGER FINGER/A-1 PULLEY;  Surgeon: Kerrin Champagne, MD;  Location: [facility];  Service: Orthopedics;  Laterality: Left;    There were no vitals filed for this visit.  Subjective Assessment - 12/07/19 1620    Subjective   I think I overdid it.  My dad fell and my husband and I have been helping him    Currently in Pain?  Yes    Pain Score  5     Pain Location  Hand  Pain Orientation  Left    Pain Descriptors / Indicators  Aching                   OT Treatments/Exercises (OP) - 12/07/19 0001      Ultrasound   Ultrasound Location  Palm, wrist    Ultrasound Parameters  81mhz, continuous, o.8 w/cm2, x 8 min    Ultrasound Goals  Pain;Edema      Splinting   Splinting  Patient with new injury to left wrist after helping move furniture after her father fell.  Patient's wrist pain had almost completely resolved.  Reviewed benefits of icing and splint to rest and protect left wrist while inflamed.  Demo'd d-ring wrist support.  Patient feels she has one at home from prior to surgery.        Manual Therapy   Manual Therapy  Joint mobilization;Soft tissue mobilization;Myofascial release    Joint Mobilization  3rd digit PIP/DIP    Soft tissue mobilization  Palm of hand, mid third metacarpal    Myofascial Release  palm              OT Education - 12/07/19 1832    Education Details  wrist splint for support and protection    Person(s) Educated  Patient    Methods  Explanation    Comprehension  Verbalized understanding       OT Short Term Goals - 12/07/19 1835      OT SHORT TERM GOAL #1   Title  Patient will complete a home exercise program designed to improve strength in left hand    Status  On-going      OT SHORT TERM GOAL #2   Title  Pt to id pain management strategies for hands including modalities, stretches, exercises and scar massage/management    Status  Achieved      OT SHORT TERM GOAL #3   Title  Patient will demonstrate improved use of left hand strength to adjust bra once hooked    Status  On-going      OT SHORT TERM GOAL #4   Title  Patient will report pain level at 3 or less with functional use of hand in ADL activity    Status  On-going        OT Long Term Goals - 12/07/19 1835      OT LONG TERM GOAL #1   Title  Patient will demonstrate independence with HEP for BUE    Status  On-going      OT LONG TERM GOAL #2   Title  Patient will demonstrate improved active wrist extension to 65 degrees to assist with use of left hand as support    Status  On-going      OT LONG TERM GOAL #3   Title  Patient will demonstrate 2 lb increase in lateral pinch strength to help her picking up small objects.    Status  On-going            Plan - 12/07/19 1833    Clinical Impression Statement  Patient had increased pain in wrist and finger today after overworking hand this weekend.  Patient unable to attend one session last week due to migraine.  Overall patient responding well to intervention.  Have not completed iontophoresis as pain has been significantly reduced.  Will consider if warranted in future visits.    OT Frequency  2x / week    OT Duration  6 weeks    OT Treatment/Interventions  Self-care/ADL training;Moist Heat;Fluidtherapy;DME and/or AE instruction;Splinting;Contrast  Bath;Compression bandaging;Therapeutic activities;Scar mobilization;Therapeutic exercise;Ultrasound;Iontophoresis;Cryotherapy;Patient/family education;Manual Therapy;Paraffin;Electrical Stimulation    Plan  fluidotherapy as warm up,  ultrasound(pulsed) to address adhesions at base of hand/ base of 2/3 digit,  continue ROM- assess need for ionto    Consulted and Agree with Plan of Care  Patient       Patient will benefit from skilled therapeutic intervention in order to improve the following deficits and impairments:           Visit Diagnosis: Stiffness of left hand, not elsewhere classified  Stiffness of left wrist, not elsewhere classified  Pain in left hand  Localized edema    Problem List Patient Active Problem List   Diagnosis Date Noted  . Carpal tunnel syndrome, left upper limb 08/11/2019    Class: Chronic  . DJD (degenerative joint disease), cervical 09/18/2016  . Other fatigue 09/18/2016  . Fibromyalgia 09/18/2016  . Primary osteoarthritis of both knees 09/18/2016  . Primary insomnia 09/18/2016  . Vitamin D deficiency 09/18/2016  . Carpal tunnel syndrome, right 09/05/2015    Class: Chronic  . Trigger finger, left middle finger 09/05/2015    Class: Chronic  . Pulmonary nodules 01/16/2013  . Dyspnea 01/13/2013    Collier Salina, OTR/L 12/07/2019, 6:38 PM  [facility] [address] [place], [place], [address] Phone: [phone]   Fax:  [phone]  Name: Holly [name] MRN: [id number] Date of Birth: January 03, 1961

## 2019-12-09 ENCOUNTER — Ambulatory Visit: Payer: BC Managed Care – PPO | Admitting: Occupational Therapy

## 2019-12-09 ENCOUNTER — Encounter: Payer: Self-pay | Admitting: Occupational Therapy

## 2019-12-09 ENCOUNTER — Other Ambulatory Visit: Payer: Self-pay

## 2019-12-09 DIAGNOSIS — M79642 Pain in left hand: Secondary | ICD-10-CM

## 2019-12-09 DIAGNOSIS — R600 Localized edema: Secondary | ICD-10-CM | POA: Diagnosis not present

## 2019-12-09 DIAGNOSIS — M25642 Stiffness of left hand, not elsewhere classified: Secondary | ICD-10-CM | POA: Diagnosis not present

## 2019-12-09 DIAGNOSIS — M25632 Stiffness of left wrist, not elsewhere classified: Secondary | ICD-10-CM

## 2019-12-09 NOTE — Therapy (Signed)
[facility] [address] [place], [place], [address] Phone: [phone]   Fax:  [phone]  Occupational Therapy Treatment  Patient Details  Name: Holly [name] MRN: [id number] Date of Birth: October 04, 1960 Referring Provider (OT): Dr Otelia Sergeant   Encounter Date: 12/09/2019  OT End of Session - 12/09/19 1631    Visit Number  5    Number of Visits  13    Date for OT Re-Evaluation  01/08/20    Authorization Type  BCBS MCR  VL:MN    Authorization - Visit Number  5    Authorization - Number of Visits  10    Progress Note Due on Visit  10    OT Start Time  1530    OT Stop Time  1615    OT Time Calculation (min)  45 min    Activity Tolerance  Patient tolerated treatment well    Behavior During Therapy  [facility] for tasks assessed/performed       Past Medical History:  Diagnosis Date  . Anxiety    takes Ativan daily  . Back pain    stenosis and buldging disc  . Bone spur    neck and buldging disc  . Depression    takes Cymbalta daily  . Diabetes (HCC)    takes Metformin daily  . Diverticulosis   . GERD (gastroesophageal reflux disease)    takes Prevacid daily  . History of bronchitis    > 5 yrs ago  . History of hiatal hernia   . Hypothyroidism    takes Synthroid daily  . IBS (irritable bowel syndrome)   . Insomnia    doesn't take any meds  . Joint pain   . Lung nodule    right middle lobe-was being followed by Dr.Burney.Medical Md is keeping up with this  . Migraine   . Migraine    last one 08/28/15  . Nocturia   . Numbness and tingling in hands   . PONV (postoperative nausea and vomiting)   . Sleep apnea   . Thoracic outlet syndrome   . TOS (thoracic outlet syndrome)   . Urinary urgency     Past Surgical History:  Procedure Laterality Date  . ABDOMINAL EXPLORATION SURGERY     cancer cells in cervix  . CARPAL TUNNEL RELEASE Right 09/05/2015   Procedure: RIGHT OPEN CARPAL TUNNEL RELEASE, RIGHT LONG  FINGER TRIGGER RELEASE,RIGHT INDEX FINGER TRIGGER RELEASE;  Surgeon: Kerrin Champagne, MD;  [facility];  Service: Orthopedics;  Laterality: Right;  . CARPAL TUNNEL RELEASE Left 08/11/2019   Procedure: LEFT OPEN CARPAL TUNNEL RELEASE AND LEFT LONG FINGER TRIGGER FINGER RELEASE;  Surgeon: Kerrin Champagne, MD;  Location: [facility];  Service: Orthopedics;  Laterality: Left;  . CHOLECYSTECTOMY    . COLONOSCOPY    . ESOPHAGOGASTRODUODENOSCOPY    . THYROIDECTOMY    . TONSILLECTOMY     adenoidectomy  . TRIGGER FINGER RELEASE Right 09/05/2015   Procedure: RELEASE TRIGGER FINGER/A-1 PULLEY;  Surgeon: Kerrin Champagne, MD;  [facility];  Service: Orthopedics;  Laterality: Right;  . TRIGGER FINGER RELEASE Left 08/11/2019   Procedure: RELEASE TRIGGER FINGER/A-1 PULLEY;  Surgeon: Kerrin Champagne, MD;  Location: [facility];  Service: Orthopedics;  Laterality: Left;    There were no vitals filed for this visit.  Subjective Assessment - 12/09/19 1523    Subjective   Now my thumb is hurting- patient indicating pain at [facility]  Currently in Pain?  Yes    Pain Score  5     Pain Location  Wrist    Pain Orientation  Left    Pain Descriptors / Indicators  Aching    Pain Type  Acute pain    Pain Onset  In the past 7 days    Pain Frequency  Intermittent    Aggravating Factors   Overuse    Pain Relieving Factors  rest                   OT Treatments/Exercises (OP) - 12/09/19 0001      Iontophoresis   Type of Iontophoresis  Dexamethasone    Location  volar hand base    Dose  2cc's dexamethasone, 1.4 mA intensity    Time  18 min  and 5 min set up   Patient with small water blister in center of then eminence     Manual Therapy   Manual Therapy  Soft tissue mobilization    Soft tissue mobilization  Palm of hand, mid third metacarpal             OT Education - 12/09/19 1629    Education Details  Precautions and contraindications of iontophoresis     Person(s) Educated  Patient    Methods  Explanation    Comprehension  Verbalized understanding       OT Short Term Goals - 12/09/19 1635      OT SHORT TERM GOAL #1   Title  Patient will complete a home exercise program designed to improve strength in left hand    Status  Achieved      OT SHORT TERM GOAL #2   Title  Pt to id pain management strategies for hands including modalities, stretches, exercises and scar massage/management    Status  Achieved      OT SHORT TERM GOAL #3   Title  Patient will demonstrate improved use of left hand strength to adjust bra once hooked    Status  On-going      OT SHORT TERM GOAL #4   Title  Patient will report pain level at 3 or less with functional use of hand in ADL activity    Status  On-going        OT Long Term Goals - 12/09/19 1635      OT LONG TERM GOAL #1   Title  Patient will demonstrate independence with HEP for BUE    Status  On-going      OT LONG TERM GOAL #2   Title  Patient will demonstrate improved active wrist extension to 65 degrees to assist with use of left hand as support    Status  On-going      OT LONG TERM GOAL #3   Title  Patient will demonstrate 2 lb increase in lateral pinch strength to help her picking up small objects.    Status  On-going            Plan - 12/09/19 1632    Clinical Impression Statement  Patient will follow up with surgeon tomorrow.  Overall patient is demonstrating imporved range of motion in 3rd digit - PIP/DIP, pain is less in palm of hand, pain persists in 3rd digit, and now again in wrist and at [facility] joint.  Patient works dilligently to manage edema, soft tissue, and range of motion for left hand.    OT Frequency  2x / week    OT Duration  6 weeks    OT Treatment/Interventions  Self-care/ADL training;Moist Heat;Fluidtherapy;DME and/or AE instruction;Splinting;Contrast Bath;Compression bandaging;Therapeutic activities;Scar mobilization;Therapeutic  exercise;Ultrasound;Iontophoresis;Cryotherapy;Patient/family education;Manual Therapy;Paraffin;Electrical Stimulation    Plan  ionto - center of palm (3rd Metacarpal)  could tolerate 1.4 mA 3/10    Consulted and Agree with Plan of Care  Patient       Patient will benefit from skilled therapeutic intervention in order to improve the following deficits and impairments:           Visit Diagnosis: Stiffness of left hand, not elsewhere classified  Stiffness of left wrist, not elsewhere classified  Pain in left hand  Localized edema    Problem List Patient Active Problem List   Diagnosis Date Noted  . Carpal tunnel syndrome, left upper limb 08/11/2019    Class: Chronic  . DJD (degenerative joint disease), cervical 09/18/2016  . Other fatigue 09/18/2016  . Fibromyalgia 09/18/2016  . Primary osteoarthritis of both knees 09/18/2016  . Primary insomnia 09/18/2016  . Vitamin D deficiency 09/18/2016  . Carpal tunnel syndrome, right 09/05/2015    Class: Chronic  . Trigger finger, left middle finger 09/05/2015    Class: Chronic  . Pulmonary nodules 01/16/2013  . Dyspnea 01/13/2013    Mariah Milling, OTR/L 12/09/2019, 4:36 PM  [facility] [address] [place], [place], [address] Phone: [phone]   Fax:  [phone]  Name: Holly [name] MRN: [id number] Date of Birth: [birth year]

## 2019-12-10 ENCOUNTER — Encounter: Payer: Self-pay | Admitting: Specialist

## 2019-12-10 ENCOUNTER — Ambulatory Visit (INDEPENDENT_AMBULATORY_CARE_PROVIDER_SITE_OTHER): Payer: BC Managed Care – PPO | Admitting: Specialist

## 2019-12-10 VITALS — BP 130/68 | HR 64 | Ht 65.0 in | Wt 209.0 lb

## 2019-12-10 DIAGNOSIS — Z9889 Other specified postprocedural states: Secondary | ICD-10-CM | POA: Diagnosis not present

## 2019-12-10 DIAGNOSIS — M65332 Trigger finger, left middle finger: Secondary | ICD-10-CM

## 2019-12-10 DIAGNOSIS — M720 Palmar fascial fibromatosis [Dupuytren]: Secondary | ICD-10-CM | POA: Diagnosis not present

## 2019-12-10 NOTE — Patient Instructions (Signed)
Plan: Will start a formal Occupational therapy program  With iontophoresis left palmar hand and left long finger, stretching and Strengthening  Left wrist and fingers. Try a dynamic extension splint for the left long finger PIP joint . Avoid lifting greater than 15 lbs. Use the left wrist carpal tunnel splint as much as tolerated for 4-6 weeks to rest the wrist ligament injury. Follow-Up Instructions: Return in about 3 weeks (around 12/03/2019).

## 2019-12-10 NOTE — Progress Notes (Signed)
Office Visit Note   Patient: Holly [name]           Date of Birth: [birth year]           MRN: [id number] Visit Date: 12/10/2019              Requested by: Josetta Huddle, MD [address] [place],  [place] [address] PCP: Josetta Huddle, MD   Assessment & Plan: Visit Diagnoses:  1. Trigger finger, left middle finger   2. Status post carpal tunnel release   3. Palmar fasciitis     Plan: Plan: Will start a formal Occupational therapy program  With iontophoresis left palmar hand and left long finger, stretching and Strengthening  Left wrist and fingers. Try a grass hopper splint for the left long finger PIP joint . Avoid lifting greater than 15 lbs. Use the left wrist carpal tunnel splint as much as tolerated for 4-6 weeks to rest the wrist ligament injury. Follow-Up Instructions: Return in about 3 weeks (around 12/03/2019).    Follow-Up Instructions: Return in about 4 weeks (around 01/07/2020).   Orders:  No orders of the defined types were placed in this encounter.  No orders of the defined types were placed in this encounter.     Procedures: No procedures performed   Clinical Data: No additional findings.   Subjective: Chief Complaint  Patient presents with  . Left Hand - Follow-up  . Left Middle Finger - Follow-up    59 year old female with history of left CTR and left long finger trigger release she has palmar fibrosis and tenderness similar to node with  Early dupuytrens nodule but may be hypertrophic scar. In either case she is please that OT is helping and pain is improving though she still  Has some amount of stiffness in the PIP joint.    Review of Systems  Constitutional: Negative.   HENT: Negative.   Eyes: Negative.   Respiratory: Negative.   Cardiovascular: Negative.   Gastrointestinal: Negative.   Endocrine: Negative.   Genitourinary: Negative.   Musculoskeletal: Negative.   Skin: Negative.   Allergic/Immunologic: Negative.     Neurological: Negative.   Hematological: Negative.   Psychiatric/Behavioral: Negative.      Objective: Vital Signs: BP 130/68 (BP Location: Left Arm, Patient Position: Sitting)   Pulse 64   Ht 5\' 5"  (1.651 m)   Wt 209 lb (94.8 kg)   BMI 34.78 kg/m   Physical Exam Constitutional:      Appearance: She is well-developed.  HENT:     Head: Normocephalic and atraumatic.  Eyes:     Pupils: Pupils are equal, round, and reactive to light.  Pulmonary:     Effort: Pulmonary effort is normal.     Breath sounds: Normal breath sounds.  Abdominal:     General: Bowel sounds are normal.     Palpations: Abdomen is soft.  Musculoskeletal:        General: Normal range of motion.     Cervical back: Normal range of motion and neck supple.  Skin:    General: Skin is warm and dry.  Neurological:     Mental Status: She is alert and oriented to person, place, and time.  Psychiatric:        Behavior: Behavior normal.        Thought Content: Thought content normal.        Judgment: Judgment normal.     Left Hand Exam   Tenderness  The  patient is experiencing tenderness in the dorsal area and palmar area.   Range of Motion  Hand  PIP Middle: 70   Other  Erythema: absent Scars: present Sensation: normal  Comments:  Tender long finger distal palmar crease, area of scar proximal to the left long finger MCP jt incision line for release of A1pulley.      Specialty Comments:  No specialty comments available.  Imaging: No results found.   PMFS History: Patient Active Problem List   Diagnosis Date Noted  . Carpal tunnel syndrome, left upper limb 08/11/2019    Priority: High    Class: Chronic  . Trigger finger, left middle finger 09/05/2015    Priority: High    Class: Chronic  . Carpal tunnel syndrome, right 09/05/2015    Priority: Low    Class: Chronic  . DJD (degenerative joint disease), cervical 09/18/2016  . Other fatigue 09/18/2016  . Fibromyalgia 09/18/2016  .  Primary osteoarthritis of both knees 09/18/2016  . Primary insomnia 09/18/2016  . Vitamin D deficiency 09/18/2016  . Pulmonary nodules 01/16/2013  . Dyspnea 01/13/2013   Past Medical History:  Diagnosis Date  . Anxiety    takes Ativan daily  . Back pain    stenosis and buldging disc  . Bone spur    neck and buldging disc  . Depression    takes Cymbalta daily  . Diabetes (HCC)    takes Metformin daily  . Diverticulosis   . GERD (gastroesophageal reflux disease)    takes Prevacid daily  . History of bronchitis    > 5 yrs ago  . History of hiatal hernia   . Hypothyroidism    takes Synthroid daily  . IBS (irritable bowel syndrome)   . Insomnia    doesn't take any meds  . Joint pain   . Lung nodule    right middle lobe-was being followed by Dr.Burney.Medical Md is keeping up with this  . Migraine   . Migraine    last one 08/28/15  . Nocturia   . Numbness and tingling in hands   . PONV (postoperative nausea and vomiting)   . Sleep apnea   . Thoracic outlet syndrome   . TOS (thoracic outlet syndrome)   . Urinary urgency     Family History  Problem Relation Age of Onset  . COPD Mother   . Cancer Mother   . Heart disease Paternal Grandmother   . Heart disease Paternal Grandfather   . Heart disease Maternal Grandmother   . Heart disease Maternal Grandfather     Past Surgical History:  Procedure Laterality Date  . ABDOMINAL EXPLORATION SURGERY     cancer cells in cervix  . CARPAL TUNNEL RELEASE Right 09/05/2015   Procedure: RIGHT OPEN CARPAL TUNNEL RELEASE, RIGHT LONG FINGER TRIGGER RELEASE,RIGHT INDEX FINGER TRIGGER RELEASE;  Surgeon: Kerrin Champagne, MD;  [facility];  Service: Orthopedics;  Laterality: Right;  . CARPAL TUNNEL RELEASE Left 08/11/2019   Procedure: LEFT OPEN CARPAL TUNNEL RELEASE AND LEFT LONG FINGER TRIGGER FINGER RELEASE;  Surgeon: Kerrin Champagne, MD;  Location: [facility];  Service: Orthopedics;  Laterality: Left;  . CHOLECYSTECTOMY     . COLONOSCOPY    . ESOPHAGOGASTRODUODENOSCOPY    . THYROIDECTOMY    . TONSILLECTOMY     adenoidectomy  . TRIGGER FINGER RELEASE Right 09/05/2015   Procedure: RELEASE TRIGGER FINGER/A-1 PULLEY;  Surgeon: Kerrin Champagne, MD;  [facility];  Service: Orthopedics;  Laterality: Right;  .  TRIGGER FINGER RELEASE Left 08/11/2019   Procedure: RELEASE TRIGGER FINGER/A-1 PULLEY;  Surgeon: Kerrin Champagne, MD;  Location: [facility];  Service: Orthopedics;  Laterality: Left;   Social History   Occupational History  . Not on file  Tobacco Use  . Smoking status: Former Smoker    Types: Cigarettes  . Smokeless tobacco: Never Used  Substance and Sexual Activity  . Alcohol use: No  . Drug use: No  . Sexual activity: Yes    Birth control/protection: Post-menopausal

## 2019-12-11 DIAGNOSIS — B0089 Other herpesviral infection: Secondary | ICD-10-CM | POA: Diagnosis not present

## 2019-12-14 ENCOUNTER — Ambulatory Visit: Payer: BC Managed Care – PPO | Admitting: Occupational Therapy

## 2019-12-16 ENCOUNTER — Other Ambulatory Visit: Payer: Self-pay

## 2019-12-16 ENCOUNTER — Ambulatory Visit: Payer: BC Managed Care – PPO | Admitting: Occupational Therapy

## 2019-12-16 ENCOUNTER — Encounter: Payer: Self-pay | Admitting: Occupational Therapy

## 2019-12-16 DIAGNOSIS — M79642 Pain in left hand: Secondary | ICD-10-CM

## 2019-12-16 DIAGNOSIS — M25632 Stiffness of left wrist, not elsewhere classified: Secondary | ICD-10-CM

## 2019-12-16 DIAGNOSIS — M25642 Stiffness of left hand, not elsewhere classified: Secondary | ICD-10-CM

## 2019-12-16 DIAGNOSIS — R600 Localized edema: Secondary | ICD-10-CM | POA: Diagnosis not present

## 2019-12-16 NOTE — Therapy (Signed)
[facility] [address] [place], [place], [address] Phone: [phone]   Fax:  [phone]  Occupational Therapy Treatment  Patient Details  Name: Holly [name] MRN: [id number] Date of Birth: 04-09-1961 Referring Provider (OT): Dr Otelia Sergeant   Encounter Date: 12/16/2019  OT End of Session - 12/16/19 1552    Visit Number  6    Number of Visits  13    Date for OT Re-Evaluation  01/08/20    Authorization Type  BCBS MCR  VL:MN    Authorization - Visit Number  6    Authorization - Number of Visits  10    Progress Note Due on Visit  10    OT Start Time  1505    OT Stop Time  1549    OT Time Calculation (min)  44 min    Activity Tolerance  Patient tolerated treatment well    Behavior During Therapy  [facility] for tasks assessed/performed       Past Medical History:  Diagnosis Date  . Anxiety    takes Ativan daily  . Back pain    stenosis and buldging disc  . Bone spur    neck and buldging disc  . Depression    takes Cymbalta daily  . Diabetes (HCC)    takes Metformin daily  . Diverticulosis   . GERD (gastroesophageal reflux disease)    takes Prevacid daily  . History of bronchitis    > 5 yrs ago  . History of hiatal hernia   . Hypothyroidism    takes Synthroid daily  . IBS (irritable bowel syndrome)   . Insomnia    doesn't take any meds  . Joint pain   . Lung nodule    right middle lobe-was being followed by Dr.Burney.Medical Md is keeping up with this  . Migraine   . Migraine    last one 08/28/15  . Nocturia   . Numbness and tingling in hands   . PONV (postoperative nausea and vomiting)   . Sleep apnea   . Thoracic outlet syndrome   . TOS (thoracic outlet syndrome)   . Urinary urgency     Past Surgical History:  Procedure Laterality Date  . ABDOMINAL EXPLORATION SURGERY     cancer cells in cervix  . CARPAL TUNNEL RELEASE Right 09/05/2015   Procedure: RIGHT OPEN CARPAL TUNNEL RELEASE, RIGHT LONG  FINGER TRIGGER RELEASE,RIGHT INDEX FINGER TRIGGER RELEASE;  Surgeon: Kerrin Champagne, MD;  [facility];  Service: Orthopedics;  Laterality: Right;  . CARPAL TUNNEL RELEASE Left 08/11/2019   Procedure: LEFT OPEN CARPAL TUNNEL RELEASE AND LEFT LONG FINGER TRIGGER FINGER RELEASE;  Surgeon: Kerrin Champagne, MD;  Location: [facility];  Service: Orthopedics;  Laterality: Left;  . CHOLECYSTECTOMY    . COLONOSCOPY    . ESOPHAGOGASTRODUODENOSCOPY    . THYROIDECTOMY    . TONSILLECTOMY     adenoidectomy  . TRIGGER FINGER RELEASE Right 09/05/2015   Procedure: RELEASE TRIGGER FINGER/A-1 PULLEY;  Surgeon: Kerrin Champagne, MD;  [facility];  Service: Orthopedics;  Laterality: Right;  . TRIGGER FINGER RELEASE Left 08/11/2019   Procedure: RELEASE TRIGGER FINGER/A-1 PULLEY;  Surgeon: Kerrin Champagne, MD;  Location: [facility];  Service: Orthopedics;  Laterality: Left;    There were no vitals filed for this visit.  Subjective Assessment - 12/16/19 1509    Subjective   I have no pain anymore - that one treatment took away my  pain    Pertinent History  DM, Fibromyalgia, Chronic migraine, anxiety    Currently in Pain?  No/denies    Pain Score  1     Pain Location  Finger (Comment which one)    Pain Orientation  Left    Pain Descriptors / Indicators  Aching    Pain Type  Acute pain    Pain Onset  1 to 4 weeks ago    Pain Frequency  Intermittent    Aggravating Factors   overuse                   OT Treatments/Exercises (OP) - 12/16/19 0001      Ultrasound   Ultrasound Location  Palm, wrist volar 3rd digit    Ultrasound Parameters  4mhz,continuous, o.8 w/cm2 x 10 min    Ultrasound Goals  Other (Comment)   scar tissue     Manual Therapy   Manual Therapy  Soft tissue mobilization;Joint mobilization    Joint Mobilization  3rd digit MCP/PIP for improved extension    Soft tissue mobilization  scar tissue palm of hand             OT Education -  12/16/19 1551    Education Details  created video demonstration for patient to allow husband to perform soft tissue mobilization    Person(s) Educated  Patient    Methods  Explanation;Demonstration;Other (comment)   video on patient's phone   Comprehension  Verbalized understanding       OT Short Term Goals - 12/16/19 1557      OT SHORT TERM GOAL #1   Title  Patient will complete a home exercise program designed to improve strength in left hand    Status  Achieved      OT SHORT TERM GOAL #2   Title  Pt to id pain management strategies for hands including modalities, stretches, exercises and scar massage/management    Status  Achieved      OT SHORT TERM GOAL #3   Title  Patient will demonstrate improved use of left hand strength to adjust bra once hooked    Status  On-going      OT SHORT TERM GOAL #4   Title  Patient will report pain level at 3 or less with functional use of hand in ADL activity    Status  Achieved        OT Long Term Goals - 12/09/19 1635      OT LONG TERM GOAL #1   Title  Patient will demonstrate independence with HEP for BUE    Status  On-going      OT LONG TERM GOAL #2   Title  Patient will demonstrate improved active wrist extension to 65 degrees to assist with use of left hand as support    Status  On-going      OT LONG TERM GOAL #3   Title  Patient will demonstrate 2 lb increase in lateral pinch strength to help her picking up small objects.    Status  On-going            Plan - 12/16/19 1553    Clinical Impression Statement  Patient with significant improvement in pain in left hand following iontophoresis treatment.  Patient thrilled with reduction in pain.  Saw surgeon last week, and she indicated that she spoke with him about lump in palm of hand - surgeon indicates that this is scar tissue.  Will continue to address.  OT Frequency  2x / week    OT Duration  6 weeks    OT Treatment/Interventions  Self-care/ADL training;Moist  Heat;Fluidtherapy;DME and/or AE instruction;Splinting;Contrast Bath;Compression bandaging;Therapeutic activities;Scar mobilization;Therapeutic exercise;Ultrasound;Iontophoresis;Cryotherapy;Patient/family education;Manual Therapy;Paraffin;Electrical Stimulation    Plan  [place] palm of hand, followed with scar tissue management palm, Needs slightly more extension in PIP 3rd digit.  start checking goals    Consulted and Agree with Plan of Care  Patient       Patient will benefit from skilled therapeutic intervention in order to improve the following deficits and impairments:           Visit Diagnosis: Stiffness of left hand, not elsewhere classified  Stiffness of left wrist, not elsewhere classified  Pain in left hand  Localized edema    Problem List Patient Active Problem List   Diagnosis Date Noted  . Carpal tunnel syndrome, left upper limb 08/11/2019    Class: Chronic  . DJD (degenerative joint disease), cervical 09/18/2016  . Other fatigue 09/18/2016  . Fibromyalgia 09/18/2016  . Primary osteoarthritis of both knees 09/18/2016  . Primary insomnia 09/18/2016  . Vitamin D deficiency 09/18/2016  . Carpal tunnel syndrome, right 09/05/2015    Class: Chronic  . Trigger finger, left middle finger 09/05/2015    Class: Chronic  . Pulmonary nodules 01/16/2013  . Dyspnea 01/13/2013    Mariah Milling, OTR/L 12/16/2019, 3:59 PM  [facility] [address] [place], [place], [address] Phone: [phone]   Fax:  [phone]  Name: Tawny Raspberry Wattenbarger MRN: [id number] Date of Birth: 1961/09/08

## 2019-12-21 ENCOUNTER — Ambulatory Visit: Payer: Medicare Other | Attending: Internal Medicine

## 2019-12-21 ENCOUNTER — Encounter: Payer: Self-pay | Admitting: Occupational Therapy

## 2019-12-21 ENCOUNTER — Ambulatory Visit: Payer: BC Managed Care – PPO

## 2019-12-21 ENCOUNTER — Other Ambulatory Visit: Payer: Self-pay

## 2019-12-21 ENCOUNTER — Ambulatory Visit: Payer: BC Managed Care – PPO | Admitting: Occupational Therapy

## 2019-12-21 DIAGNOSIS — M79642 Pain in left hand: Secondary | ICD-10-CM

## 2019-12-21 DIAGNOSIS — M25632 Stiffness of left wrist, not elsewhere classified: Secondary | ICD-10-CM

## 2019-12-21 DIAGNOSIS — R600 Localized edema: Secondary | ICD-10-CM | POA: Diagnosis not present

## 2019-12-21 DIAGNOSIS — M25642 Stiffness of left hand, not elsewhere classified: Secondary | ICD-10-CM

## 2019-12-21 DIAGNOSIS — Z23 Encounter for immunization: Secondary | ICD-10-CM

## 2019-12-21 NOTE — Patient Instructions (Signed)
11. Grip Strengthening (Resistive Putty)   Squeeze putty using thumb and all fingers. Repeat 15 times. Do 1-2 sessions per day.   Extension (Assistive Putty)   Roll putty back and forth, being sure to use all fingertips. Repeat 3 times. Do 1-2 sessions per day.  Then pinch as below.   Palmar Pinch Strengthening (Resistive Putty)   Pinch putty between thumb and each fingertip in turn after rolling out             IP Fisting (Resistive Putty)        FINGERS: Extension (Putty)    Open hand and fingers to flatten putty. 2-3 reps per set, 1-2 sets per day, 5-6 days per week

## 2019-12-21 NOTE — Therapy (Signed)
[facility] [address] [place], [place], [address] Phone: [phone]   Fax:  [phone]  Occupational Therapy Treatment  Patient Details  Name: Holly [name] MRN: [id number] Date of Birth: 06-27-1961 Referring Provider (OT): Dr Louanne Skye   Encounter Date: 12/21/2019  OT End of Session - 12/21/19 1830    Visit Number  7    Number of Visits  13    Date for OT Re-Evaluation  01/08/20    Authorization Type  BCBS MCR  VL:MN    Authorization - Visit Number  7    Authorization - Number of Visits  10    Progress Note Due on Visit  10    OT Start Time  7408    OT Stop Time  1700    OT Time Calculation (min)  43 min    Activity Tolerance  Patient tolerated treatment well    Behavior During Therapy  [facility] for tasks assessed/performed       Past Medical History:  Diagnosis Date  . Anxiety    takes Ativan daily  . Back pain    stenosis and buldging disc  . Bone spur    neck and buldging disc  . Depression    takes Cymbalta daily  . Diabetes ([place])    takes Metformin daily  . Diverticulosis   . GERD (gastroesophageal reflux disease)    takes Prevacid daily  . History of bronchitis    > 5 yrs ago  . History of hiatal hernia   . Hypothyroidism    takes Synthroid daily  . IBS (irritable bowel syndrome)   . Insomnia    doesn't take any meds  . Joint pain   . Lung nodule    right middle lobe-was being followed by Dr.Burney.Medical Md is keeping up with this  . Migraine   . Migraine    last one 08/28/15  . Nocturia   . Numbness and tingling in hands   . PONV (postoperative nausea and vomiting)   . Sleep apnea   . Thoracic outlet syndrome   . TOS (thoracic outlet syndrome)   . Urinary urgency     Past Surgical History:  Procedure Laterality Date  . ABDOMINAL EXPLORATION SURGERY     cancer cells in cervix  . CARPAL TUNNEL RELEASE Right 09/05/2015   Procedure: RIGHT OPEN CARPAL TUNNEL RELEASE, RIGHT LONG  FINGER TRIGGER RELEASE,RIGHT INDEX FINGER TRIGGER RELEASE;  Surgeon: Jessy Oto, MD;  Location: [place];  Service: Orthopedics;  Laterality: Right;  . CARPAL TUNNEL RELEASE Left 08/11/2019   Procedure: LEFT OPEN CARPAL TUNNEL RELEASE AND LEFT LONG FINGER TRIGGER FINGER RELEASE;  Surgeon: Jessy Oto, MD;  Location: [place];  Service: Orthopedics;  Laterality: Left;  . CHOLECYSTECTOMY    . COLONOSCOPY    . ESOPHAGOGASTRODUODENOSCOPY    . THYROIDECTOMY    . TONSILLECTOMY     adenoidectomy  . TRIGGER FINGER RELEASE Right 09/05/2015   Procedure: RELEASE TRIGGER FINGER/A-1 PULLEY;  Surgeon: Jessy Oto, MD;  Location: [place];  Service: Orthopedics;  Laterality: Right;  . TRIGGER FINGER RELEASE Left 08/11/2019   Procedure: RELEASE TRIGGER FINGER/A-1 PULLEY;  Surgeon: Jessy Oto, MD;  Location: [place];  Service: Orthopedics;  Laterality: Left;    There were no vitals filed for this visit.  Subjective Assessment - 12/21/19 1618    Subjective   When I wake up its not as sore, and I have quite  a bit more flexibility    Pertinent History  DM, Fibromyalgia, Chronic migraine, anxiety    Currently in Pain?  Yes    Pain Location  Finger (Comment which one)    Pain Orientation  Left    Pain Descriptors / Indicators  Aching    Pain Type  Chronic pain    Pain Radiating Towards  NA    Pain Onset  1 to 4 weeks ago    Pain Frequency  Intermittent                   OT Treatments/Exercises (OP) - 12/21/19 0001      Hand Exercises   Other Hand Exercises  Therapy putty- yellow - see patient instructions      Ultrasound   Ultrasound Location  Palm, base of wrist, 3rd digit - volar    Ultrasound Parameters  , continuous, 0.8 w/cm2 x 10 min    Ultrasound Goals  Other (Comment)      Manual Therapy   Manual Therapy  Soft tissue mobilization    Joint Mobilization  3rd digit MCP/PIP for improved extension    Soft tissue mobilization  scar  tissue palm of hand in conjunction with passive range of motion third digit             OT Education - 12/21/19 1830    Education Details  added therapy putty exercises to begin gentle strengthening    Person(s) Educated  Patient    Methods  Explanation;Demonstration;Verbal cues;Handout    Comprehension  Verbalized understanding;Returned demonstration       OT Short Term Goals - 12/16/19 1557      OT SHORT TERM GOAL #1   Title  Patient will complete a home exercise program designed to improve strength in left hand    Status  Achieved      OT SHORT TERM GOAL #2   Title  Pt to id pain management strategies for hands including modalities, stretches, exercises and scar massage/management    Status  Achieved      OT SHORT TERM GOAL #3   Title  Patient will demonstrate improved use of left hand strength to adjust bra once hooked    Status  On-going      OT SHORT TERM GOAL #4   Title  Patient will report pain level at 3 or less with functional use of hand in ADL activity    Status  Achieved        OT Long Term Goals - 12/09/19 1635      OT LONG TERM GOAL #1   Title  Patient will demonstrate independence with HEP for BUE    Status  On-going      OT LONG TERM GOAL #2   Title  Patient will demonstrate improved active wrist extension to 65 degrees to assist with use of left hand as support    Status  On-going      OT LONG TERM GOAL #3   Title  Patient will demonstrate 2 lb increase in lateral pinch strength to help her picking up small objects.    Status  On-going            Plan - 12/21/19 1831    Clinical Impression Statement  Patient continues with pain reduction, and improved mobility and use of left hand.  Beginning to work toward strengthening.    OT Frequency  2x / week    OT Duration  6 weeks  OT Treatment/Interventions  Self-care/ADL training;Moist Heat;Fluidtherapy;DME and/or AE instruction;Splinting;Contrast Bath;Compression bandaging;Therapeutic  activities;Scar mobilization;Therapeutic exercise;Ultrasound;Iontophoresis;Cryotherapy;Patient/family education;Manual Therapy;Paraffin;Electrical Stimulation    Plan  [place] palm of hand, followed with scar tissue management palm, Needs slightly more extension in PIP 3rd digit.  start checking goals, try massage ball to address soft tissue mob in palm    OT Home Exercise Plan  Added putty exercise    Consulted and Agree with Plan of Care  Patient       Patient will benefit from skilled therapeutic intervention in order to improve the following deficits and impairments:           Visit Diagnosis: Stiffness of left hand, not elsewhere classified  Stiffness of left wrist, not elsewhere classified  Pain in left hand  Localized edema    Problem List Patient Active Problem List   Diagnosis Date Noted  . Carpal tunnel syndrome, left upper limb 08/11/2019    Class: Chronic  . DJD (degenerative joint disease), cervical 09/18/2016  . Other fatigue 09/18/2016  . Fibromyalgia 09/18/2016  . Primary osteoarthritis of both knees 09/18/2016  . Primary insomnia 09/18/2016  . Vitamin D deficiency 09/18/2016  . Carpal tunnel syndrome, right 09/05/2015    Class: Chronic  . Trigger finger, left middle finger 09/05/2015    Class: Chronic  . Pulmonary nodules 01/16/2013  . Dyspnea 01/13/2013    Collier Salina, OTR/L 12/21/2019, 6:34 PM  [facility] [address] [place], [place], [address] Phone: [phone]   Fax:  [phone]  Name: Holly [name] MRN: [id number] Date of Birth: December 01, 1960

## 2019-12-21 NOTE — Progress Notes (Addendum)
   Covid-19 Vaccination Clinic  Name:  Holly [name]    MRN: [id number] DOB: 06/16/1961  12/21/2019  [name] was observed post Covid-19 immunization for 30 minutes without incident. She was provided with Vaccine Information Sheet and instruction to access the V-Safe system.   [name] was instructed to call 911 with any severe reactions post vaccine: Marland Kitchen Difficulty breathing  . Swelling of face and throat  . A fast heartbeat  . A bad rash all over body  . Dizziness and weakness   Immunizations Administered    Name Date Dose VIS Date Route   Pfizer COVID-19 Vaccine 12/21/2019 12:25 PM 0.3 mL 09/11/2019 Intramuscular   Manufacturer: [employer], [employer]   Lot: [id number]   NDC: [address]-8

## 2019-12-23 ENCOUNTER — Ambulatory Visit: Payer: BC Managed Care – PPO | Admitting: Occupational Therapy

## 2019-12-28 ENCOUNTER — Ambulatory Visit: Payer: BC Managed Care – PPO | Admitting: Occupational Therapy

## 2019-12-30 ENCOUNTER — Other Ambulatory Visit: Payer: Self-pay

## 2019-12-30 ENCOUNTER — Ambulatory Visit: Payer: BC Managed Care – PPO | Admitting: Occupational Therapy

## 2019-12-30 ENCOUNTER — Encounter: Payer: Self-pay | Admitting: Occupational Therapy

## 2019-12-30 DIAGNOSIS — M79642 Pain in left hand: Secondary | ICD-10-CM

## 2019-12-30 DIAGNOSIS — M25632 Stiffness of left wrist, not elsewhere classified: Secondary | ICD-10-CM

## 2019-12-30 DIAGNOSIS — M25642 Stiffness of left hand, not elsewhere classified: Secondary | ICD-10-CM | POA: Diagnosis not present

## 2019-12-30 DIAGNOSIS — R600 Localized edema: Secondary | ICD-10-CM

## 2019-12-30 NOTE — Therapy (Signed)
[facility] [address] [place], [place], [address] Phone: [phone]   Fax:  [phone]  Occupational Therapy Treatment  Patient Details  Name: Holly [name] MRN: [id number] Date of Birth: [birth year] Referring Provider (OT): Dr Louanne Skye   Encounter Date: 12/30/2019  OT End of Session - 12/30/19 1614    Visit Number  8    Number of Visits  13    Date for OT Re-Evaluation  01/08/20    Authorization Type  BCBS MCR  VL:MN    Authorization - Visit Number  8    Authorization - Number of Visits  10    Progress Note Due on Visit  10    OT Start Time  0175    OT Stop Time  1615    OT Time Calculation (min)  44 min    Activity Tolerance  Patient tolerated treatment well    Behavior During Therapy  [facility] for tasks assessed/performed       Past Medical History:  Diagnosis Date  . Anxiety    takes Ativan daily  . Back pain    stenosis and buldging disc  . Bone spur    neck and buldging disc  . Depression    takes Cymbalta daily  . Diabetes ([place])    takes Metformin daily  . Diverticulosis   . GERD (gastroesophageal reflux disease)    takes Prevacid daily  . History of bronchitis    > 5 yrs ago  . History of hiatal hernia   . Hypothyroidism    takes Synthroid daily  . IBS (irritable bowel syndrome)   . Insomnia    doesn't take any meds  . Joint pain   . Lung nodule    right middle lobe-was being followed by Dr.Burney.Medical Md is keeping up with this  . Migraine   . Migraine    last one 08/28/15  . Nocturia   . Numbness and tingling in hands   . PONV (postoperative nausea and vomiting)   . Sleep apnea   . Thoracic outlet syndrome   . TOS (thoracic outlet syndrome)   . Urinary urgency     Past Surgical History:  Procedure Laterality Date  . ABDOMINAL EXPLORATION SURGERY     cancer cells in cervix  . CARPAL TUNNEL RELEASE Right 09/05/2015   Procedure: RIGHT OPEN CARPAL TUNNEL RELEASE, RIGHT LONG  FINGER TRIGGER RELEASE,RIGHT INDEX FINGER TRIGGER RELEASE;  Surgeon: Jessy Oto, MD;  Location: [place];  Service: Orthopedics;  Laterality: Right;  . CARPAL TUNNEL RELEASE Left 08/11/2019   Procedure: LEFT OPEN CARPAL TUNNEL RELEASE AND LEFT LONG FINGER TRIGGER FINGER RELEASE;  Surgeon: Jessy Oto, MD;  Location: [place];  Service: Orthopedics;  Laterality: Left;  . CHOLECYSTECTOMY    . COLONOSCOPY    . ESOPHAGOGASTRODUODENOSCOPY    . THYROIDECTOMY    . TONSILLECTOMY     adenoidectomy  . TRIGGER FINGER RELEASE Right 09/05/2015   Procedure: RELEASE TRIGGER FINGER/A-1 PULLEY;  Surgeon: Jessy Oto, MD;  Location: [place];  Service: Orthopedics;  Laterality: Right;  . TRIGGER FINGER RELEASE Left 08/11/2019   Procedure: RELEASE TRIGGER FINGER/A-1 PULLEY;  Surgeon: Jessy Oto, MD;  Location: [place];  Service: Orthopedics;  Laterality: Left;    There were no vitals filed for this visit.  Subjective Assessment - 12/30/19 1540    Subjective   Patient missed last two sessions due to migraines - cannot get Botox  injections for migraines due to recent vaccination    Currently in Pain?  Yes    Pain Score  2     Pain Location  Finger (Comment which one)   base of third finger left   Pain Orientation  Left    Pain Descriptors / Indicators  Aching    Pain Type  Chronic pain    Pain Radiating Towards  NA    Pain Onset  1 to 4 weeks ago    Pain Frequency  Intermittent    Aggravating Factors   overuse    Pain Relieving Factors  rest                   OT Treatments/Exercises (OP) - 12/30/19 0001      Ultrasound   Ultrasound Location  palm, base of wrist    Ultrasound Parameters  , continuous, 0.8 w/cm2 , x 8 min    Ultrasound Goals  Other (Comment)      Manual Therapy   Manual Therapy  Soft tissue mobilization    Manual therapy comments  Added spikey ball for self soft tissue massage    Joint Mobilization  3rd digit MCP/PIP for  improved extension    Soft tissue mobilization  scar tissue palm of hand in conjunction with passive range of motion third digit    Myofascial Release  palm             OT Education - 12/30/19 1613    Education Details  spikey ball for self soft tissue massage    Person(s) Educated  Patient    Methods  Explanation;Demonstration;Tactile cues    Comprehension  Verbalized understanding;Returned demonstration       OT Short Term Goals - 12/16/19 1557      OT SHORT TERM GOAL #1   Title  Patient will complete a home exercise program designed to improve strength in left hand    Status  Achieved      OT SHORT TERM GOAL #2   Title  Pt to id pain management strategies for hands including modalities, stretches, exercises and scar massage/management    Status  Achieved      OT SHORT TERM GOAL #3   Title  Patient will demonstrate improved use of left hand strength to adjust bra once hooked    Status  On-going      OT SHORT TERM GOAL #4   Title  Patient will report pain level at 3 or less with functional use of hand in ADL activity    Status  Achieved        OT Long Term Goals - 12/09/19 1635      OT LONG TERM GOAL #1   Title  Patient will demonstrate independence with HEP for BUE    Status  On-going      OT LONG TERM GOAL #2   Title  Patient will demonstrate improved active wrist extension to 65 degrees to assist with use of left hand as support    Status  On-going      OT LONG TERM GOAL #3   Title  Patient will demonstrate 2 lb increase in lateral pinch strength to help her picking up small objects.    Status  On-going            Plan - 12/30/19 1733    Clinical Impression Statement  Patient missed two sessions recently due to migraines.  Patient continues to note overall decrease in pain, and  improved active movement in left hand.    OT Frequency  2x / week    OT Duration  6 weeks    OT Treatment/Interventions  Self-care/ADL training;Moist Heat;Fluidtherapy;DME  and/or AE instruction;Splinting;Contrast Bath;Compression bandaging;Therapeutic activities;Scar mobilization;Therapeutic exercise;Ultrasound;Iontophoresis;Cryotherapy;Patient/family education;Manual Therapy;Paraffin;Electrical Stimulation    Plan  [place] palm of hand, followed with scar tissue management palm, Needs slightly more extension in PIP 3rd digit.  start checking goals,    OT Home Exercise Plan  Added putty exercise    Consulted and Agree with Plan of Care  Patient       Patient will benefit from skilled therapeutic intervention in order to improve the following deficits and impairments:           Visit Diagnosis: Stiffness of left hand, not elsewhere classified  Stiffness of left wrist, not elsewhere classified  Pain in left hand  Localized edema    Problem List Patient Active Problem List   Diagnosis Date Noted  . Carpal tunnel syndrome, left upper limb 08/11/2019    Class: Chronic  . DJD (degenerative joint disease), cervical 09/18/2016  . Other fatigue 09/18/2016  . Fibromyalgia 09/18/2016  . Primary osteoarthritis of both knees 09/18/2016  . Primary insomnia 09/18/2016  . Vitamin D deficiency 09/18/2016  . Carpal tunnel syndrome, right 09/05/2015    Class: Chronic  . Trigger finger, left middle finger 09/05/2015    Class: Chronic  . Pulmonary nodules 01/16/2013  . Dyspnea 01/13/2013    Collier Salina, OTR/L 12/30/2019, 5:38 PM  [facility] [address] [place], [place], [address] Phone: [phone]   Fax:  [phone]  Name: Holly [name] MRN: [id number] Date of Birth: 24-Sep-1961

## 2020-01-04 DIAGNOSIS — L659 Nonscarring hair loss, unspecified: Secondary | ICD-10-CM | POA: Diagnosis not present

## 2020-01-04 DIAGNOSIS — E890 Postprocedural hypothyroidism: Secondary | ICD-10-CM | POA: Diagnosis not present

## 2020-01-05 ENCOUNTER — Other Ambulatory Visit: Payer: Self-pay

## 2020-01-05 ENCOUNTER — Ambulatory Visit: Payer: BC Managed Care – PPO | Attending: Specialist | Admitting: Occupational Therapy

## 2020-01-05 ENCOUNTER — Encounter: Payer: Self-pay | Admitting: Occupational Therapy

## 2020-01-05 DIAGNOSIS — R600 Localized edema: Secondary | ICD-10-CM | POA: Diagnosis not present

## 2020-01-05 DIAGNOSIS — M25642 Stiffness of left hand, not elsewhere classified: Secondary | ICD-10-CM | POA: Insufficient documentation

## 2020-01-05 DIAGNOSIS — M25632 Stiffness of left wrist, not elsewhere classified: Secondary | ICD-10-CM | POA: Diagnosis not present

## 2020-01-05 DIAGNOSIS — M79642 Pain in left hand: Secondary | ICD-10-CM

## 2020-01-05 NOTE — Therapy (Signed)
[facility] [address] [place], [place], [address] Phone: [phone]   Fax:  [phone]  Occupational Therapy Treatment  Patient Details  Name: Holly [name] MRN: [id number] Date of Birth: 08-Feb-1961 Referring Provider (OT): Dr Otelia Sergeant   Encounter Date: 01/05/2020  OT End of Session - 01/05/20 1658    Visit Number  9    Date for OT Re-Evaluation  01/08/20    Authorization Type  BCBS MCR  VL:MN    Authorization - Visit Number  9    Authorization - Number of Visits  10    Progress Note Due on Visit  10    OT Start Time  1617    OT Stop Time  1659    OT Time Calculation (min)  42 min    Activity Tolerance  Patient tolerated treatment well    Behavior During Therapy  [facility] for tasks assessed/performed       Past Medical History:  Diagnosis Date  . Anxiety    takes Ativan daily  . Back pain    stenosis and buldging disc  . Bone spur    neck and buldging disc  . Depression    takes Cymbalta daily  . Diabetes ([facility])    takes Metformin daily  . Diverticulosis   . GERD (gastroesophageal reflux disease)    takes Prevacid daily  . History of bronchitis    > 5 yrs ago  . History of hiatal hernia   . Hypothyroidism    takes Synthroid daily  . IBS (irritable bowel syndrome)   . Insomnia    doesn't take any meds  . Joint pain   . Lung nodule    right middle lobe-was being followed by Dr.Burney.Medical Md is keeping up with this  . Migraine   . Migraine    last one 08/28/15  . Nocturia   . Numbness and tingling in hands   . PONV (postoperative nausea and vomiting)   . Sleep apnea   . Thoracic outlet syndrome   . TOS (thoracic outlet syndrome)   . Urinary urgency     Past Surgical History:  Procedure Laterality Date  . ABDOMINAL EXPLORATION SURGERY     cancer cells in cervix  . CARPAL TUNNEL RELEASE Right 09/05/2015   Procedure: RIGHT OPEN CARPAL TUNNEL RELEASE, RIGHT LONG FINGER TRIGGER RELEASE,RIGHT  INDEX FINGER TRIGGER RELEASE;  Surgeon: Kerrin Champagne, MD;  [facility];  Service: Orthopedics;  Laterality: Right;  . CARPAL TUNNEL RELEASE Left 08/11/2019   Procedure: LEFT OPEN CARPAL TUNNEL RELEASE AND LEFT LONG FINGER TRIGGER FINGER RELEASE;  Surgeon: Kerrin Champagne, MD;  Location: [facility];  Service: Orthopedics;  Laterality: Left;  . CHOLECYSTECTOMY    . COLONOSCOPY    . ESOPHAGOGASTRODUODENOSCOPY    . THYROIDECTOMY    . TONSILLECTOMY     adenoidectomy  . TRIGGER FINGER RELEASE Right 09/05/2015   Procedure: RELEASE TRIGGER FINGER/A-1 PULLEY;  Surgeon: Kerrin Champagne, MD;  Location[facility];  Service: Orthopedics;  Laterality: Right;  . TRIGGER FINGER RELEASE Left 08/11/2019   Procedure: RELEASE TRIGGER FINGER/A-1 PULLEY;  Surgeon: Kerrin Champagne, MD;  Location: [facility];  Service: Orthopedics;  Laterality: Left;    There were no vitals filed for this visit.  Subjective Assessment - 01/05/20 1620    Subjective   Patient indicates she has had some periods without pain!    Currently in Pain?  Yes  Pain Score  2     Pain Location  Finger (Comment which one)    Pain Orientation  Left    Pain Descriptors / Indicators  Aching    Pain Type  Chronic pain    Pain Onset  1 to 4 weeks ago    Pain Frequency  Intermittent    Aggravating Factors   overuse    Pain Relieving Factors  rest                   OT Treatments/Exercises (OP) - 01/05/20 0001      Exercises   Exercises  Hand      Hand Exercises   Other Hand Exercises  digiflex 1.5, and 3 lbs x 10 reps, and digiextend x 10 reps without discomfort      Ultrasound   Ultrasound Location  palm 3rd digit base of hand    Ultrasound Parameters  30mhz, 0.8 w/cm2 continuous, x 10 min    Ultrasound Goals  Other (Comment)   scar management     Manual Therapy   Manual Therapy  Soft tissue mobilization    Joint Mobilization  3rd digit MCP/PIP for improved extension    Soft tissue  mobilization  scar tissue palm of hand in conjunction with passive range of motion third digit    Myofascial Release  palm               OT Short Term Goals - 12/16/19 1557      OT SHORT TERM GOAL #1   Title  Patient will complete a home exercise program designed to improve strength in left hand    Status  Achieved      OT SHORT TERM GOAL #2   Title  Pt to id pain management strategies for hands including modalities, stretches, exercises and scar massage/management    Status  Achieved      OT SHORT TERM GOAL #3   Title  Patient will demonstrate improved use of left hand strength to adjust bra once hooked    Status  On-going      OT SHORT TERM GOAL #4   Title  Patient will report pain level at 3 or less with functional use of hand in ADL activity    Status  Achieved        OT Long Term Goals - 12/09/19 1635      OT LONG TERM GOAL #1   Title  Patient will demonstrate independence with HEP for BUE    Status  On-going      OT LONG TERM GOAL #2   Title  Patient will demonstrate improved active wrist extension to 65 degrees to assist with use of left hand as support    Status  On-going      OT LONG TERM GOAL #3   Title  Patient will demonstrate 2 lb increase in lateral pinch strength to help her picking up small objects.    Status  On-going            Plan - 01/05/20 1659    Clinical Impression Statement  Patient continues to report decreased stiffness and pain in hand and third digit    OT Frequency  2x / week    OT Duration  6 weeks    OT Treatment/Interventions  Self-care/ADL training;Moist Heat;Fluidtherapy;DME and/or AE instruction;Splinting;Contrast Bath;Compression bandaging;Therapeutic activities;Scar mobilization;Therapeutic exercise;Ultrasound;Iontophoresis;Cryotherapy;Patient/family education;Manual Therapy;Paraffin;Electrical Stimulation    Plan  check goals, progress notes, [place] palm of hand, followed with scar  tissue management palm, Needs slightly  more extension in PIP 3rd digit.  start checking goals,       Patient will benefit from skilled therapeutic intervention in order to improve the following deficits and impairments:           Visit Diagnosis: Stiffness of left hand, not elsewhere classified  Stiffness of left wrist, not elsewhere classified  Pain in left hand  Localized edema    Problem List Patient Active Problem List   Diagnosis Date Noted  . Carpal tunnel syndrome, left upper limb 08/11/2019    Class: Chronic  . DJD (degenerative joint disease), cervical 09/18/2016  . Other fatigue 09/18/2016  . Fibromyalgia 09/18/2016  . Primary osteoarthritis of both knees 09/18/2016  . Primary insomnia 09/18/2016  . Vitamin D deficiency 09/18/2016  . Carpal tunnel syndrome, right 09/05/2015    Class: Chronic  . Trigger finger, left middle finger 09/05/2015    Class: Chronic  . Pulmonary nodules 01/16/2013  . Dyspnea 01/13/2013    Collier Salina, OTR/L 01/05/2020, 5:01 PM   [facility] [address] [place], [place], [address] Phone: [phone]   Fax:  [phone]  Name: Holly [name] MRN: [id number] Date of Birth: April 10, 1961

## 2020-01-06 ENCOUNTER — Other Ambulatory Visit: Payer: Self-pay | Admitting: Internal Medicine

## 2020-01-06 DIAGNOSIS — Z1231 Encounter for screening mammogram for malignant neoplasm of breast: Secondary | ICD-10-CM

## 2020-01-07 ENCOUNTER — Ambulatory Visit: Payer: BC Managed Care – PPO | Admitting: Occupational Therapy

## 2020-01-07 ENCOUNTER — Other Ambulatory Visit: Payer: Self-pay

## 2020-01-07 ENCOUNTER — Encounter: Payer: Self-pay | Admitting: Occupational Therapy

## 2020-01-07 DIAGNOSIS — M25632 Stiffness of left wrist, not elsewhere classified: Secondary | ICD-10-CM | POA: Diagnosis not present

## 2020-01-07 DIAGNOSIS — M79642 Pain in left hand: Secondary | ICD-10-CM

## 2020-01-07 DIAGNOSIS — M25642 Stiffness of left hand, not elsewhere classified: Secondary | ICD-10-CM | POA: Diagnosis not present

## 2020-01-07 DIAGNOSIS — R600 Localized edema: Secondary | ICD-10-CM | POA: Diagnosis not present

## 2020-01-07 NOTE — Therapy (Signed)
[facility] [address] [place], [place], [address] Phone: [phone]   Fax:  [phone]  Occupational Therapy Treatment and Progress Note  Patient Details  Name: Holly [name] MRN: [id number] Date of Birth: 1961-05-11 Referring Provider (OT): Dr Louanne Skye  Covering dates of service 11/24/19-01/07/20 Encounter Date: 01/07/2020    OT End of Session - 01/07/20 1758    Visit Number  10    Number of Visits  13    Date for OT Re-Evaluation  01/08/20    Authorization Type  BCBS MCR  VL:MN    Authorization - Visit Number  10    Authorization - Number of Visits  10    Progress Note Due on Visit  20    OT Start Time  1617    OT Stop Time  1700    OT Time Calculation (min)  43 min    Activity Tolerance  Patient tolerated treatment well    Behavior During Therapy  [facility] for tasks assessed/performed       Past Medical History:  Diagnosis Date  . Anxiety    takes Ativan daily  . Back pain    stenosis and buldging disc  . Bone spur    neck and buldging disc  . Depression    takes Cymbalta daily  . Diabetes ([place])    takes Metformin daily  . Diverticulosis   . GERD (gastroesophageal reflux disease)    takes Prevacid daily  . History of bronchitis    > 5 yrs ago  . History of hiatal hernia   . Hypothyroidism    takes Synthroid daily  . IBS (irritable bowel syndrome)   . Insomnia    doesn't take any meds  . Joint pain   . Lung nodule    right middle lobe-was being followed by Dr.Burney.Medical Md is keeping up with this  . Migraine   . Migraine    last one 08/28/15  . Nocturia   . Numbness and tingling in hands   . PONV (postoperative nausea and vomiting)   . Sleep apnea   . Thoracic outlet syndrome   . TOS (thoracic outlet syndrome)   . Urinary urgency     Past Surgical History:  Procedure Laterality Date  . ABDOMINAL EXPLORATION SURGERY     cancer cells in cervix  . CARPAL TUNNEL RELEASE Right 09/05/2015    Procedure: RIGHT OPEN CARPAL TUNNEL RELEASE, RIGHT LONG FINGER TRIGGER RELEASE,RIGHT INDEX FINGER TRIGGER RELEASE;  Surgeon: Jessy Oto, MD;  Location: [facility];  Service: Orthopedics;  Laterality: Right;  . CARPAL TUNNEL RELEASE Left 08/11/2019   Procedure: LEFT OPEN CARPAL TUNNEL RELEASE AND LEFT LONG FINGER TRIGGER FINGER RELEASE;  Surgeon: Jessy Oto, MD;  Location: [facility];  Service: Orthopedics;  Laterality: Left;  . CHOLECYSTECTOMY    . COLONOSCOPY    . ESOPHAGOGASTRODUODENOSCOPY    . THYROIDECTOMY    . TONSILLECTOMY     adenoidectomy  . TRIGGER FINGER RELEASE Right 09/05/2015   Procedure: RELEASE TRIGGER FINGER/A-1 PULLEY;  Surgeon: Jessy Oto, MD;  Location: [facility];  Service: Orthopedics;  Laterality: Right;  . TRIGGER FINGER RELEASE Left 08/11/2019   Procedure: RELEASE TRIGGER FINGER/A-1 PULLEY;  Surgeon: Jessy Oto, MD;  Location: [facility];  Service: Orthopedics;  Laterality: Left;    There were no vitals filed for this visit.  Subjective Assessment - 01/07/20 1631    Subjective   Patient has slight  increase in pain after massaging her dad's shoulders    Pertinent History  DM, Fibromyalgia, Chronic migraine, anxiety    Currently in Pain?  Yes    Pain Score  2     Pain Location  Hand    Pain Orientation  Left    Pain Descriptors / Indicators  Aching    Pain Type  Acute pain;Chronic pain    Pain Onset  1 to 4 weeks ago    Pain Frequency  Intermittent    Aggravating Factors   overuse    Pain Relieving Factors  rest                   OT Treatments/Exercises (OP) - 01/07/20 0001      Hand Exercises   Other Hand Exercises  DIP blocking exercises for improved flex/ext      Ultrasound   Ultrasound Location  Palm    Ultrasound Parameters  , continuous, 0.8 w/cm2 x 8 min    Ultrasound Goals  Other (Comment)      Manual Therapy   Manual Therapy  Soft tissue mobilization    Joint Mobilization  3rd digit MCP/PIP  for improved extension    Soft tissue mobilization  scar tissue palm of hand in conjunction with passive range of motion third digit               OT Short Term Goals - 01/07/20 1803      OT SHORT TERM GOAL #1   Status  Achieved      OT SHORT TERM GOAL #2   Title  Pt to id pain management strategies for hands including modalities, stretches, exercises and scar massage/management    Status  Achieved      OT SHORT TERM GOAL #3   Title  Patient will demonstrate improved use of left hand strength to adjust bra once hooked    Status  Achieved      OT SHORT TERM GOAL #4   Title  Patient will report pain level at 3 or less with functional use of hand in ADL activity    Status  Achieved        OT Long Term Goals - 01/07/20 1803      OT LONG TERM GOAL #1   Title  Patient will demonstrate independence with HEP for BUE due 02/07/20    Status  On-going      OT LONG TERM GOAL #2   Title  Patient will demonstrate improved active wrist extension to 65 degrees to assist with use of left hand as support    Status  On-going      OT LONG TERM GOAL #3   Title  Patient will demonstrate 2 lb increase in lateral pinch strength to help her picking up small objects.    Status  On-going            Plan - 01/07/20 1759    Clinical Impression Statement  Patient has made good progress in terms of decreased pain, improved range of motion in third digit, decreased stiffness in left hand.  Patient continues to have small knot in central palm, from past trigger finger release surgery.  Patient faithfully completes hand management strategies, and home exercise program to reduce symptoms.    OT Frequency  2x / week    OT Duration  6 weeks    OT Treatment/Interventions  Self-care/ADL training;Moist Heat;Fluidtherapy;DME and/or AE instruction;Splinting;Contrast Bath;Compression bandaging;Therapeutic activities;Scar mobilization;Therapeutic  exercise;Ultrasound;Iontophoresis;Cryotherapy;Patient/family education;Manual Therapy;Paraffin;Electrical Stimulation  Plan  [place] palm of hand, followed with scar tissue management palm, Needs slightly more extension in PIP 3rd digit.       Patient will benefit from skilled therapeutic intervention in order to improve the following deficits and impairments:           Visit Diagnosis: Stiffness of left hand, not elsewhere classified - Plan: Ot plan of care cert/re-cert  Stiffness of left wrist, not elsewhere classified - Plan: Ot plan of care cert/re-cert  Pain in left hand - Plan: Ot plan of care cert/re-cert  Localized edema - Plan: Ot plan of care cert/re-cert    Problem List Patient Active Problem List   Diagnosis Date Noted  . Carpal tunnel syndrome, left upper limb 08/11/2019    Class: Chronic  . DJD (degenerative joint disease), cervical 09/18/2016  . Other fatigue 09/18/2016  . Fibromyalgia 09/18/2016  . Primary osteoarthritis of both knees 09/18/2016  . Primary insomnia 09/18/2016  . Vitamin D deficiency 09/18/2016  . Carpal tunnel syndrome, right 09/05/2015    Class: Chronic  . Trigger finger, left middle finger 09/05/2015    Class: Chronic  . Pulmonary nodules 01/16/2013  . Dyspnea 01/13/2013    Collier Salina, OTR/L 01/07/2020, 6:07 PM  [facility] [address] [place], [place], [address] Phone: [phone]   Fax:  [phone]  Name: Rindy Kollman Hund MRN: [id number] Date of Birth: 1960-12-04

## 2020-01-11 ENCOUNTER — Other Ambulatory Visit: Payer: Self-pay

## 2020-01-11 ENCOUNTER — Ambulatory Visit (INDEPENDENT_AMBULATORY_CARE_PROVIDER_SITE_OTHER): Payer: BC Managed Care – PPO | Admitting: Specialist

## 2020-01-11 ENCOUNTER — Ambulatory Visit: Payer: BC Managed Care – PPO | Attending: Internal Medicine

## 2020-01-11 ENCOUNTER — Encounter: Payer: Self-pay | Admitting: Occupational Therapy

## 2020-01-11 ENCOUNTER — Encounter: Payer: Self-pay | Admitting: Specialist

## 2020-01-11 ENCOUNTER — Ambulatory Visit: Payer: BC Managed Care – PPO | Admitting: Occupational Therapy

## 2020-01-11 VITALS — BP 146/81 | HR 79 | Ht 65.0 in | Wt 209.0 lb

## 2020-01-11 DIAGNOSIS — M79642 Pain in left hand: Secondary | ICD-10-CM

## 2020-01-11 DIAGNOSIS — Z23 Encounter for immunization: Secondary | ICD-10-CM

## 2020-01-11 DIAGNOSIS — M65332 Trigger finger, left middle finger: Secondary | ICD-10-CM

## 2020-01-11 DIAGNOSIS — M25632 Stiffness of left wrist, not elsewhere classified: Secondary | ICD-10-CM

## 2020-01-11 DIAGNOSIS — M25642 Stiffness of left hand, not elsewhere classified: Secondary | ICD-10-CM | POA: Diagnosis not present

## 2020-01-11 DIAGNOSIS — G5602 Carpal tunnel syndrome, left upper limb: Secondary | ICD-10-CM

## 2020-01-11 DIAGNOSIS — R600 Localized edema: Secondary | ICD-10-CM | POA: Diagnosis not present

## 2020-01-11 NOTE — Patient Instructions (Signed)
Use the left hand as normally as possible apply antiiflamatory at night prior to bed and keep the hands warm. Used warm soaks and stretching exercises. CBD oil or cream may be of benefit. voltaren gel is a anti inflamatory agent and is able to diminish inflamation and swelling.

## 2020-01-11 NOTE — Therapy (Signed)
[facility] [address] [place], [place], [address] Phone: [phone]   Fax:  [phone]  Occupational Therapy Treatment  Patient Details  Name: Holly [name] MRN: [id number] Date of Birth: 1961/09/18 Referring Provider (OT): Dr Otelia Sergeant   Encounter Date: 01/11/2020  OT End of Session - 01/11/20 1332    Visit Number  11    Number of Visits  13    Date for OT Re-Evaluation  01/08/20    Authorization Type  BCBS MCR  VL:MN    Authorization - Visit Number  11    Progress Note Due on Visit  20    OT Start Time  1233    OT Stop Time  1318    OT Time Calculation (min)  45 min    Activity Tolerance  Patient tolerated treatment well    Behavior During Therapy  [facility] for tasks assessed/performed       Past Medical History:  Diagnosis Date  . Anxiety    takes Ativan daily  . Back pain    stenosis and buldging disc  . Bone spur    neck and buldging disc  . Depression    takes Cymbalta daily  . Diabetes ([facility])    takes Metformin daily  . Diverticulosis   . GERD (gastroesophageal reflux disease)    takes Prevacid daily  . History of bronchitis    > 5 yrs ago  . History of hiatal hernia   . Hypothyroidism    takes Synthroid daily  . IBS (irritable bowel syndrome)   . Insomnia    doesn't take any meds  . Joint pain   . Lung nodule    right middle lobe-was being followed by Dr.Burney.Medical Md is keeping up with this  . Migraine   . Migraine    last one 08/28/15  . Nocturia   . Numbness and tingling in hands   . PONV (postoperative nausea and vomiting)   . Sleep apnea   . Thoracic outlet syndrome   . TOS (thoracic outlet syndrome)   . Urinary urgency     Past Surgical History:  Procedure Laterality Date  . ABDOMINAL EXPLORATION SURGERY     cancer cells in cervix  . CARPAL TUNNEL RELEASE Right 09/05/2015   Procedure: RIGHT OPEN CARPAL TUNNEL RELEASE, RIGHT LONG FINGER TRIGGER RELEASE,RIGHT INDEX FINGER  TRIGGER RELEASE;  Surgeon: Kerrin Champagne, MD;  [facility];  Service: Orthopedics;  Laterality: Right;  . CARPAL TUNNEL RELEASE Left 08/11/2019   Procedure: LEFT OPEN CARPAL TUNNEL RELEASE AND LEFT LONG FINGER TRIGGER FINGER RELEASE;  Surgeon: Kerrin Champagne, MD;  Location: [facility];  Service: Orthopedics;  Laterality: Left;  . CHOLECYSTECTOMY    . COLONOSCOPY    . ESOPHAGOGASTRODUODENOSCOPY    . THYROIDECTOMY    . TONSILLECTOMY     adenoidectomy  . TRIGGER FINGER RELEASE Right 09/05/2015   Procedure: RELEASE TRIGGER FINGER/A-1 PULLEY;  Surgeon: Kerrin Champagne, MD;  Location: [facility];  Service: Orthopedics;  Laterality: Right;  . TRIGGER FINGER RELEASE Left 08/11/2019   Procedure: RELEASE TRIGGER FINGER/A-1 PULLEY;  Surgeon: Kerrin Champagne, MD;  Location: [facility];  Service: Orthopedics;  Laterality: Left;    There were no vitals filed for this visit.  Subjective Assessment - 01/11/20 1238    Subjective   Patient concerned regarding pain and stiffness in left hand each am.  Has scheduled appointment with Dr Otelia Sergeant this afternoon  Pertinent History  DM, Fibromyalgia, Chronic migraine, anxiety    Currently in Pain?  Yes    Pain Score  2     Pain Location  Hand    Pain Orientation  Left    Pain Descriptors / Indicators  Aching    Pain Type  Acute pain;Chronic pain    Pain Onset  1 to 4 weeks ago    Pain Frequency  Intermittent    Aggravating Factors   overuse    Pain Relieving Factors  rest                   OT Treatments/Exercises (OP) - 01/11/20 0001      ADLs   UB Dressing  Patient indicates that she can now put her bra on independently as hand pain/strength is better    ADL Comments  Reaching the end of this plan of care.  Have been discussing potential follow up options.  Explained to patient that she may very well have to manage pain and stiffnes for an extended amount of time - setting the expectation that hand may not be without  pain and stiffness at end of this plan of care.  Discussed that while unlikely as she is aggressively managing hand outside of therapy, that if problem would to worsen she would always be able to come back for add'l care, with a  new referral as warranted.        Ultrasound   Ultrasound Location  PALM    Ultrasound Parameters  , continuous, 0.8 w/cm2    Ultrasound Goals  Other (Comment)      LUE Fluidotherapy   Number Minutes Fluidotherapy  8 Minutes    LUE Fluidotherapy Location  Hand;Wrist    Comments  wrapped into composite flexion      Manual Therapy   Manual Therapy  Soft tissue mobilization    Manual therapy comments  Patient with continued knot at base of third (long ) digit.  Patient with persistent pain and stiffness in the morning, which is concerning to her.  We discussed use of anti-inflammatory medications, but she is hesitant due to fatty liver.  Encouraged her to speak with her surgeon at her appointment this afternoon.  Discussed contrast (hot and cold) to address stiffness - patient is already performing.      Joint Mobilization  3rd digit MCP/PIP for improved extension    Soft tissue mobilization  scar tissue palm of hand in conjunction with passive range of motion third digit               OT Short Term Goals - 01/07/20 1803      OT SHORT TERM GOAL #1   Status  Achieved      OT SHORT TERM GOAL #2   Title  Pt to id pain management strategies for hands including modalities, stretches, exercises and scar massage/management    Status  Achieved      OT SHORT TERM GOAL #3   Title  Patient will demonstrate improved use of left hand strength to adjust bra once hooked    Status  Achieved      OT SHORT TERM GOAL #4   Title  Patient will report pain level at 3 or less with functional use of hand in ADL activity    Status  Achieved        OT Long Term Goals - 01/07/20 1803      OT LONG TERM GOAL #1  Title  Patient will demonstrate independence with HEP  for BUE due 02/07/20    Status  On-going      OT LONG TERM GOAL #2   Title  Patient will demonstrate improved active wrist extension to 65 degrees to assist with use of left hand as support    Status  On-going      OT LONG TERM GOAL #3   Title  Patient will demonstrate 2 lb increase in lateral pinch strength to help her picking up small objects.    Status  On-going            Plan - 01/11/20 1333    Clinical Impression Statement  Patient has made good progress in terms of decreased pain, improved range of motion in third digit, decreased stiffness in left hand.  Patient continues to have small knot in central palm, and stiffness in third digit.  Patient faithfully completes hand management strategies, and home exercise program to reduce symptoms.Discussed that as this plan of care is coming to a close she may need to continue to manage symptoms as she has been doing.  Certainly if symptoms increase/worsen - she could return for add'l therapy.    OT Frequency  2x / week    OT Duration  6 weeks    OT Treatment/Interventions  Self-care/ADL training;Moist Heat;Fluidtherapy;DME and/or AE instruction;Splinting;Contrast Bath;Compression bandaging;Therapeutic activities;Scar mobilization;Therapeutic exercise;Ultrasound;Iontophoresis;Cryotherapy;Patient/family education;Manual Therapy;Paraffin;Electrical Stimulation    Plan  [place] palm of hand, followed with scar tissue management palm, Needs slightly more extension in PIP 3rd digit.    Consulted and Agree with Plan of Care  Patient       Patient will benefit from skilled therapeutic intervention in order to improve the following deficits and impairments:           Visit Diagnosis: Stiffness of left hand, not elsewhere classified  Stiffness of left wrist, not elsewhere classified  Pain in left hand  Localized edema    Problem List Patient Active Problem List   Diagnosis Date Noted  . Carpal tunnel syndrome, left upper limb  08/11/2019    Class: Chronic  . DJD (degenerative joint disease), cervical 09/18/2016  . Other fatigue 09/18/2016  . Fibromyalgia 09/18/2016  . Primary osteoarthritis of both knees 09/18/2016  . Primary insomnia 09/18/2016  . Vitamin D deficiency 09/18/2016  . Carpal tunnel syndrome, right 09/05/2015    Class: Chronic  . Trigger finger, left middle finger 09/05/2015    Class: Chronic  . Pulmonary nodules 01/16/2013  . Dyspnea 01/13/2013    Mariah Milling, OTR/L 01/11/2020, 1:36 PM  [facility] [address] [place], [place], [address] Phone: [phone]   Fax:  [phone]  Name: Holly [name] MRN: [id number] Date of Birth: Jun 28, 1961

## 2020-01-11 NOTE — Progress Notes (Signed)
   Covid-19 Vaccination Clinic  Name:  Holly [name]    MRN: [id number] DOB: [birth year]  01/11/2020  Holly [name] was observed post Covid-19 immunization for 15 minutes without incident. She was provided with Vaccine Information Sheet and instruction to access the V-Safe system.   Holly [name] was instructed to call 911 with any severe reactions post vaccine: Marland Kitchen Difficulty breathing  . Swelling of face and throat  . A fast heartbeat  . A bad rash all over body  . Dizziness and weakness   Immunizations Administered    Name Date Dose VIS Date Route   Pfizer COVID-19 Vaccine 01/11/2020 10:53 AM 0.3 mL 09/11/2019 Intramuscular   Manufacturer: [employer], [employer]   Lot: [id number]   NDC: [address]-9

## 2020-01-11 NOTE — Progress Notes (Signed)
Office Visit Note   Patient: Holly [name]           Date of Birth: Dec 01, 1960           MRN: [id number] Visit Date: 01/11/2020              Requested by: Marden Noble, MD [address] [place],  [place] [address] PCP: Marden Noble, MD   Assessment & Plan: Visit Diagnoses:  1. Severe carpal tunnel syndrome, left   2. Trigger middle finger of left hand     Plan: Use the left hand as normally as possible apply antiiflamatory at night prior to bed and keep the hands warm. Used warm soaks and stretching exercises. CBD oil or cream may be of benefit. voltaren gel is a anti inflamatory agent and is able to diminish inflamation and swelling.  Follow-Up Instructions: No follow-ups on file.   Orders:  No orders of the defined types were placed in this encounter.  No orders of the defined types were placed in this encounter.     Procedures: No procedures performed   Clinical Data: No additional findings.   Subjective: Chief Complaint  Patient presents with  . Left Hand - Follow-up    59 year old female with history of left CTR and left ring finger trigger release. Therapy has helped to improve the ROM and discomfort associated with the left hand.    Review of Systems  Constitutional: Negative.   HENT: Negative.   Eyes: Negative.   Respiratory: Negative.   Cardiovascular: Negative.   Gastrointestinal: Negative.   Endocrine: Negative.   Genitourinary: Negative.   Musculoskeletal: Negative.   Skin: Negative.   Allergic/Immunologic: Negative.   Neurological: Negative.   Hematological: Negative.   Psychiatric/Behavioral: Negative.      Objective: Vital Signs: BP (!) 146/81 (BP Location: Left Arm, Patient Position: Sitting)   Pulse 79   Ht 5\' 5"  (1.651 m)   Wt 209 lb (94.8 kg)   BMI 34.78 kg/m   Physical Exam Constitutional:      Appearance: She is well-developed.  HENT:     Head: Normocephalic and atraumatic.  Eyes:     Pupils:  Pupils are equal, round, and reactive to light.  Pulmonary:     Effort: Pulmonary effort is normal.     Breath sounds: Normal breath sounds.  Abdominal:     General: Bowel sounds are normal.     Palpations: Abdomen is soft.  Musculoskeletal:        General: Normal range of motion.     Cervical back: Normal range of motion and neck supple.  Skin:    General: Skin is warm and dry.  Neurological:     Mental Status: She is alert and oriented to person, place, and time.  Psychiatric:        Behavior: Behavior normal.        Thought Content: Thought content normal.        Judgment: Judgment normal.     Left Hand Exam   Tenderness  The patient is experiencing tenderness in the palmar area.   Range of Motion  Wrist  Extension: normal  Flexion: normal  Pronation: normal  Supination: normal  Hand  MP Ring: 10  PIP Ring: 80  DIP Ring: 90   Muscle Strength  The patient has normal left wrist strength. Wrist extension: 5/5  Wrist flexion: 5/5  Grip:  5/5   Tests  Phalen's Sign: negative Tinel's sign (  median nerve): negative  Other  Erythema: absent Scars: present Sensation: normal Pulse: present      Specialty Comments:  No specialty comments available.  Imaging: No results found.   PMFS History: Patient Active Problem List   Diagnosis Date Noted  . Carpal tunnel syndrome, left upper limb 08/11/2019    Priority: High    Class: Chronic  . Trigger finger, left middle finger 09/05/2015    Priority: High    Class: Chronic  . Carpal tunnel syndrome, right 09/05/2015    Priority: Low    Class: Chronic  . DJD (degenerative joint disease), cervical 09/18/2016  . Other fatigue 09/18/2016  . Fibromyalgia 09/18/2016  . Primary osteoarthritis of both knees 09/18/2016  . Primary insomnia 09/18/2016  . Vitamin D deficiency 09/18/2016  . Pulmonary nodules 01/16/2013  . Dyspnea 01/13/2013   Past Medical History:  Diagnosis Date  . Anxiety    takes Ativan  daily  . Back pain    stenosis and buldging disc  . Bone spur    neck and buldging disc  . Depression    takes Cymbalta daily  . Diabetes ([place])    takes Metformin daily  . Diverticulosis   . GERD (gastroesophageal reflux disease)    takes Prevacid daily  . History of bronchitis    > 5 yrs ago  . History of hiatal hernia   . Hypothyroidism    takes Synthroid daily  . IBS (irritable bowel syndrome)   . Insomnia    doesn't take any meds  . Joint pain   . Lung nodule    right middle lobe-was being followed by Dr.Burney.Medical Md is keeping up with this  . Migraine   . Migraine    last one 08/28/15  . Nocturia   . Numbness and tingling in hands   . PONV (postoperative nausea and vomiting)   . Sleep apnea   . Thoracic outlet syndrome   . TOS (thoracic outlet syndrome)   . Urinary urgency     Family History  Problem Relation Age of Onset  . COPD Mother   . Cancer Mother   . Heart disease Paternal Grandmother   . Heart disease Paternal Grandfather   . Heart disease Maternal Grandmother   . Heart disease Maternal Grandfather     Past Surgical History:  Procedure Laterality Date  . ABDOMINAL EXPLORATION SURGERY     cancer cells in cervix  . CARPAL TUNNEL RELEASE Right 09/05/2015   Procedure: RIGHT OPEN CARPAL TUNNEL RELEASE, RIGHT LONG FINGER TRIGGER RELEASE,RIGHT INDEX FINGER TRIGGER RELEASE;  Surgeon: Jessy Oto, MD;  Location: [facility];  Service: Orthopedics;  Laterality: Right;  . CARPAL TUNNEL RELEASE Left 08/11/2019   Procedure: LEFT OPEN CARPAL TUNNEL RELEASE AND LEFT LONG FINGER TRIGGER FINGER RELEASE;  Surgeon: Jessy Oto, MD;  Location: [facility];  Service: Orthopedics;  Laterality: Left;  . CHOLECYSTECTOMY    . COLONOSCOPY    . ESOPHAGOGASTRODUODENOSCOPY    . THYROIDECTOMY    . TONSILLECTOMY     adenoidectomy  . TRIGGER FINGER RELEASE Right 09/05/2015   Procedure: RELEASE TRIGGER FINGER/A-1 PULLEY;  Surgeon: Jessy Oto, MD;  Location:  [facility];  Service: Orthopedics;  Laterality: Right;  . TRIGGER FINGER RELEASE Left 08/11/2019   Procedure: RELEASE TRIGGER FINGER/A-1 PULLEY;  Surgeon: Jessy Oto, MD;  Location: [facility];  Service: Orthopedics;  Laterality: Left;   Social History   Occupational History  . Not on file  Tobacco Use  . Smoking status: Former Smoker    Types: Cigarettes  . Smokeless tobacco: Never Used  Substance and Sexual Activity  . Alcohol use: No  . Drug use: No  . Sexual activity: Yes    Birth control/protection: Post-menopausal

## 2020-01-11 NOTE — Progress Notes (Signed)
   Covid-19 Vaccination Clinic  Name:  Ashana Tullo Maloney    MRN: [id number] DOB: 10/30/1960  01/11/2020  Ms. Dull was observed post Covid-19 immunization for 15 minutes without incident. She was provided with Vaccine Information Sheet and instruction to access the V-Safe system.   Ms. Buttrey was instructed to call 911 with any severe reactions post vaccine: Marland Kitchen Difficulty breathing  . Swelling of face and throat  . A fast heartbeat  . A bad rash all over body  . Dizziness and weakness   Immunizations Administered    Name Date Dose VIS Date Route   Pfizer COVID-19 Vaccine 01/11/2020 10:53 AM 0.3 mL 09/11/2019 Intramuscular   Manufacturer: [employer], [employer]   Lot: [id number]   NDC: [address]-8      Covid-19 Vaccination Clinic  Name:  Glendene Wyer Mandarino    MRN: [id number] DOB: Jan 25, 1961  01/11/2020  Ms. Bushway was observed post Covid-19 immunization for 15 minutes without incident. She was provided with Vaccine Information Sheet and instruction to access the V-Safe system.   Ms. Buening was instructed to call 911 with any severe reactions post vaccine: Marland Kitchen Difficulty breathing  . Swelling of face and throat  . A fast heartbeat  . A bad rash all over body  . Dizziness and weakness   Immunizations Administered    Name Date Dose VIS Date Route   Pfizer COVID-19 Vaccine 01/11/2020 10:53 AM 0.3 mL 09/11/2019 Intramuscular   Manufacturer: [employer], [employer]   Lot: [id number]   NDC: [address]-5

## 2020-01-13 ENCOUNTER — Encounter: Payer: Self-pay | Admitting: Occupational Therapy

## 2020-01-20 ENCOUNTER — Ambulatory Visit: Payer: BC Managed Care – PPO | Admitting: Occupational Therapy

## 2020-01-27 DIAGNOSIS — F329 Major depressive disorder, single episode, unspecified: Secondary | ICD-10-CM | POA: Diagnosis not present

## 2020-01-27 DIAGNOSIS — M542 Cervicalgia: Secondary | ICD-10-CM | POA: Diagnosis not present

## 2020-01-27 DIAGNOSIS — G43719 Chronic migraine without aura, intractable, without status migrainosus: Secondary | ICD-10-CM | POA: Diagnosis not present

## 2020-01-27 DIAGNOSIS — F419 Anxiety disorder, unspecified: Secondary | ICD-10-CM | POA: Diagnosis not present

## 2020-01-27 DIAGNOSIS — M797 Fibromyalgia: Secondary | ICD-10-CM | POA: Diagnosis not present

## 2020-02-01 DIAGNOSIS — H903 Sensorineural hearing loss, bilateral: Secondary | ICD-10-CM | POA: Diagnosis not present

## 2020-02-01 DIAGNOSIS — H9122 Sudden idiopathic hearing loss, left ear: Secondary | ICD-10-CM | POA: Diagnosis not present

## 2020-02-05 ENCOUNTER — Ambulatory Visit: Payer: Medicare Other

## 2020-02-12 DIAGNOSIS — G4733 Obstructive sleep apnea (adult) (pediatric): Secondary | ICD-10-CM | POA: Diagnosis not present

## 2020-02-19 DIAGNOSIS — D2262 Melanocytic nevi of left upper limb, including shoulder: Secondary | ICD-10-CM | POA: Diagnosis not present

## 2020-02-19 DIAGNOSIS — L814 Other melanin hyperpigmentation: Secondary | ICD-10-CM | POA: Diagnosis not present

## 2020-02-19 DIAGNOSIS — D225 Melanocytic nevi of trunk: Secondary | ICD-10-CM | POA: Diagnosis not present

## 2020-02-19 DIAGNOSIS — D2261 Melanocytic nevi of right upper limb, including shoulder: Secondary | ICD-10-CM | POA: Diagnosis not present

## 2020-02-25 DIAGNOSIS — H903 Sensorineural hearing loss, bilateral: Secondary | ICD-10-CM | POA: Diagnosis not present

## 2020-02-25 DIAGNOSIS — H9122 Sudden idiopathic hearing loss, left ear: Secondary | ICD-10-CM | POA: Diagnosis not present

## 2020-03-04 DIAGNOSIS — K625 Hemorrhage of anus and rectum: Secondary | ICD-10-CM | POA: Diagnosis not present

## 2020-03-04 DIAGNOSIS — K2970 Gastritis, unspecified, without bleeding: Secondary | ICD-10-CM | POA: Diagnosis not present

## 2020-03-04 DIAGNOSIS — K219 Gastro-esophageal reflux disease without esophagitis: Secondary | ICD-10-CM | POA: Diagnosis not present

## 2020-03-04 DIAGNOSIS — K2900 Acute gastritis without bleeding: Secondary | ICD-10-CM | POA: Diagnosis not present

## 2020-03-11 ENCOUNTER — Ambulatory Visit: Payer: Medicare Other

## 2020-03-15 ENCOUNTER — Other Ambulatory Visit: Payer: Self-pay

## 2020-03-15 ENCOUNTER — Ambulatory Visit
Admission: RE | Admit: 2020-03-15 | Discharge: 2020-03-15 | Disposition: A | Discharge status: Home / Self Care | Payer: BC Managed Care – PPO | Source: Ambulatory Visit | Attending: Internal Medicine | Admitting: Internal Medicine

## 2020-03-15 DIAGNOSIS — Z1231 Encounter for screening mammogram for malignant neoplasm of breast: Secondary | ICD-10-CM

## 2020-04-26 DIAGNOSIS — M797 Fibromyalgia: Secondary | ICD-10-CM | POA: Diagnosis not present

## 2020-04-26 DIAGNOSIS — G43719 Chronic migraine without aura, intractable, without status migrainosus: Secondary | ICD-10-CM | POA: Diagnosis not present

## 2020-04-26 DIAGNOSIS — M542 Cervicalgia: Secondary | ICD-10-CM | POA: Diagnosis not present

## 2020-04-26 DIAGNOSIS — G43709 Chronic migraine without aura, not intractable, without status migrainosus: Secondary | ICD-10-CM | POA: Diagnosis not present

## 2020-06-09 ENCOUNTER — Telehealth: Payer: Self-pay

## 2020-06-09 NOTE — Telephone Encounter (Signed)
Cand do possible repeat with eval same day but let her know if we feel it is something different we may have different recc than shot

## 2020-06-09 NOTE — Telephone Encounter (Signed)
Patient called stating she had previously been treated for disc in her lumbar with ESI by Dr Alvester Morin with good relief. She recently is having recurrent pain and would like to know if Dr Alvester Morin could see her for this to do repeat injection or does she have to see Dr Otelia Sergeant first? She has not had a recent injury. Please call patient to discuss [phone]

## 2020-06-09 NOTE — Telephone Encounter (Signed)
Patient's last lumbar injection was a left L3 TF on 08/26/18. Please advise.

## 2020-06-10 NOTE — Telephone Encounter (Signed)
Is auth needed for [id number]? Patient is scheduled for 9/22 with driver.

## 2020-06-10 NOTE — Telephone Encounter (Signed)
Auth# [id number] was approve for pt.

## 2020-06-22 ENCOUNTER — Other Ambulatory Visit: Payer: Self-pay

## 2020-06-22 ENCOUNTER — Ambulatory Visit (INDEPENDENT_AMBULATORY_CARE_PROVIDER_SITE_OTHER): Payer: BC Managed Care – PPO | Admitting: Physical Medicine and Rehabilitation

## 2020-06-22 ENCOUNTER — Encounter: Payer: Self-pay | Admitting: Physical Medicine and Rehabilitation

## 2020-06-22 ENCOUNTER — Ambulatory Visit: Payer: Self-pay

## 2020-06-22 VITALS — BP 139/95 | HR 73

## 2020-06-22 DIAGNOSIS — M5416 Radiculopathy, lumbar region: Secondary | ICD-10-CM

## 2020-06-22 DIAGNOSIS — M5116 Intervertebral disc disorders with radiculopathy, lumbar region: Secondary | ICD-10-CM | POA: Diagnosis not present

## 2020-06-22 MED ORDER — METHYLPREDNISOLONE ACETATE 80 MG/ML IJ SUSP
80.0000 mg | Freq: Once | INTRAMUSCULAR | Status: AC
  Administered 2020-06-22: 80 mg

## 2020-06-22 NOTE — Progress Notes (Signed)
Holly [name] - 59 y.o. female MRN [id number]  Date of birth: 07-31-1961  Office Visit Note: Visit Date: 06/22/2020 PCP: Marden Noble, MD Referred by: Marden Noble, MD  Subjective: Chief Complaint  Patient presents with  . Lower Back - Pain  . Left Thigh - Pain   HPI:  Holly [name] is a 59 y.o. female who comes in today for planned repeat Left L3-L4 Lumbar epidural steroid injection with fluoroscopic guidance.  The patient has failed conservative care including home exercise, medications, time and activity modification.  This injection will be diagnostic and hopefully therapeutic.  Please see requesting physician notes for further details and justification. Patient received more than 50% pain relief from prior injection.   Referring: Dr. Lavada Mesi  MRI reviewed with images and spine model.  MRI reviewed in the note below.  Prior injection did offer more than 50% relief as noted above and did give the patient more functional ability for activities of daily living and was also beneficial in that it did reduce her medication requirement.  Procedures are done as part of a comprehensive orthopedic and pain management program.    ROS Otherwise per HPI.  Assessment & Plan: Visit Diagnoses:  1. Radiculopathy due to lumbar intervertebral disc disorder   2. Lumbar radiculopathy     Plan: No additional findings.   Meds & Orders:  Meds ordered this encounter  Medications  . methylPREDNISolone acetate (DEPO-MEDROL) injection 80 mg    Orders Placed This Encounter  Procedures  . XR C-ARM NO REPORT  . Epidural Steroid injection    Follow-up: Return if symptoms worsen or fail to improve.   Procedures: No procedures performed  Lumbosacral Transforaminal Epidural Steroid Injection - Sub-Pedicular Approach with Fluoroscopic Guidance  Patient: Holly [name]      Date of Birth: 24-Aug-1961 MRN: [id number] PCP: Marden Noble, MD      Visit Date:  06/22/2020   Universal Protocol:    Date/Time: 06/22/2020  Consent Given By: the patient  Position: PRONE  Additional Comments: Vital signs were monitored before and after the procedure. Patient was prepped and draped in the usual sterile fashion. The correct patient, procedure, and site was verified.   Injection Procedure Details:  Procedure Site One Meds Administered:  Meds ordered this encounter  Medications  . methylPREDNISolone acetate (DEPO-MEDROL) injection 80 mg    Laterality: Left  Location/Site:  L3-L4  Needle size: 22 G  Needle type: Spinal  Needle Placement: Transforaminal  Findings:    -Comments: Excellent flow of contrast along the nerve, nerve root and into the epidural space.  Procedure Details: After squaring off the end-plates to get a true AP view, the C-arm was positioned so that an oblique view of the foramen as noted above was visualized. The target area is just inferior to the "nose of the scotty dog" or sub pedicular. The soft tissues overlying this structure were infiltrated with 2-3 ml. of 1% Lidocaine without Epinephrine.  The spinal needle was inserted toward the target using a "trajectory" view along the fluoroscope beam.  Under AP and lateral visualization, the needle was advanced so it did not puncture dura and was located close the 6 O'Clock position of the pedical in AP tracterory. Biplanar projections were used to confirm position. Aspiration was confirmed to be negative for CSF and/or blood. A 1-2 ml. volume of Isovue-250 was injected and flow of contrast was noted at each level. Radiographs were obtained for documentation purposes.   After attaining  the desired flow of contrast documented above, a 0.5 to 1.0 ml test dose of 0.25% Marcaine was injected into each respective transforaminal space.  The patient was observed for 90 seconds post injection.  After no sensory deficits were reported, and normal lower extremity motor function was  noted,   the above injectate was administered so that equal amounts of the injectate were placed at each foramen (level) into the transforaminal epidural space.   Additional Comments:  The patient tolerated the procedure well Dressing: 2 x 2 sterile gauze and Band-Aid    Post-procedure details: Patient was observed during the procedure. Post-procedure instructions were reviewed.  Patient left the clinic in stable condition.      Clinical History: 08/04/2015 EMG/NCS Impression: The above electrodiagnostic study is ABNORMAL and reveals evidence of: 1. A moderate right median nerve entrapment at the wrist (carpal tunnel syndrome) affecting sensory and motor components.  2. A mild left median nerve entrapment at the wrist (carpal tunnel syndrome) affecting sensory components.   There is no significant electrodiagnostic evidence of any other focal nerve entrapment, brachial plexopathy or cervical radiculopathy.  ------- 08/03/2013 EMG/NCS Impression: The above electrodiagnostic study is ABNORMAL and reveals evidence suggestive of mild chronic C5 radiculopathy on the left.    There is no significant electrodiagnostic evidence of any other focal nerve entrapment, brachial plexopathy or generalized peripheral neuropathy.   *Clinically, the differential diagnosis should include radiculitis/radiculopathy, median nerve neuritis, myofascial pain and nonspecific neurogenic thoracic outlet syndrome.     Objective:  VS:  HT:    WT:   BMI:     BP:(!) 139/95  HR:73bpm  TEMP: ( )  RESP:  Physical Exam Constitutional:      General: She is not in acute distress.    Appearance: Normal appearance. She is not ill-appearing.  HENT:     Head: Normocephalic and atraumatic.     Right Ear: External ear normal.     Left Ear: External ear normal.  Eyes:     Extraocular Movements: Extraocular movements intact.  Cardiovascular:     Rate and Rhythm: Normal rate.     Pulses: Normal pulses.    Musculoskeletal:     Right lower leg: No edema.     Left lower leg: No edema.     Comments: Patient has good distal strength with no pain over the greater trochanters.  No clonus or focal weakness.  Skin:    Findings: No erythema, lesion or rash.  Neurological:     General: No focal deficit present.     Mental Status: She is alert and oriented to person, place, and time.     Sensory: No sensory deficit.     Motor: No weakness or abnormal muscle tone.     Coordination: Coordination normal.  Psychiatric:        Mood and Affect: Mood normal.        Behavior: Behavior normal.      Imaging: No results found.

## 2020-06-22 NOTE — Procedures (Signed)
Lumbosacral Transforaminal Epidural Steroid Injection - Sub-Pedicular Approach with Fluoroscopic Guidance  Patient: Holly [name]      Date of Birth: 08-25-1961 MRN: [id number] PCP: Marden Noble, MD      Visit Date: 06/22/2020   Universal Protocol:    Date/Time: 06/22/2020  Consent Given By: the patient  Position: PRONE  Additional Comments: Vital signs were monitored before and after the procedure. Patient was prepped and draped in the usual sterile fashion. The correct patient, procedure, and site was verified.   Injection Procedure Details:  Procedure Site One Meds Administered:  Meds ordered this encounter  Medications  . methylPREDNISolone acetate (DEPO-MEDROL) injection 80 mg    Laterality: Left  Location/Site:  L3-L4  Needle size: 22 G  Needle type: Spinal  Needle Placement: Transforaminal  Findings:    -Comments: Excellent flow of contrast along the nerve, nerve root and into the epidural space.  Procedure Details: After squaring off the end-plates to get a true AP view, the C-arm was positioned so that an oblique view of the foramen as noted above was visualized. The target area is just inferior to the "nose of the scotty dog" or sub pedicular. The soft tissues overlying this structure were infiltrated with 2-3 ml. of 1% Lidocaine without Epinephrine.  The spinal needle was inserted toward the target using a "trajectory" view along the fluoroscope beam.  Under AP and lateral visualization, the needle was advanced so it did not puncture dura and was located close the 6 O'Clock position of the pedical in AP tracterory. Biplanar projections were used to confirm position. Aspiration was confirmed to be negative for CSF and/or blood. A 1-2 ml. volume of Isovue-250 was injected and flow of contrast was noted at each level. Radiographs were obtained for documentation purposes.   After attaining the desired flow of contrast documented above, a 0.5 to 1.0  ml test dose of 0.25% Marcaine was injected into each respective transforaminal space.  The patient was observed for 90 seconds post injection.  After no sensory deficits were reported, and normal lower extremity motor function was noted,   the above injectate was administered so that equal amounts of the injectate were placed at each foramen (level) into the transforaminal epidural space.   Additional Comments:  The patient tolerated the procedure well Dressing: 2 x 2 sterile gauze and Band-Aid    Post-procedure details: Patient was observed during the procedure. Post-procedure instructions were reviewed.  Patient left the clinic in stable condition.

## 2020-06-22 NOTE — Progress Notes (Signed)
Pt state Lower back pain that travels down to her left hip to her thigh. Pt state walking and standing for a long period of time. Pt state sitting to long makes the pain worse. Pt state she has to put pillow in between her legs at night to ease her pain.   Numeric Pain Rating Scale and Functional Assessment Average Pain 5   In the last MONTH (on 0-10 scale) has pain interfered with the following?  1. General activity like being  able to carry out your everyday physical activities such as walking, climbing stairs, carrying groceries, or moving a chair?  Rating(10)   +Driver, -BT, -Dye Allergies.

## 2020-07-28 DIAGNOSIS — G43719 Chronic migraine without aura, intractable, without status migrainosus: Secondary | ICD-10-CM | POA: Diagnosis not present

## 2020-07-28 DIAGNOSIS — M542 Cervicalgia: Secondary | ICD-10-CM | POA: Diagnosis not present

## 2020-07-28 DIAGNOSIS — M545 Low back pain, unspecified: Secondary | ICD-10-CM | POA: Diagnosis not present

## 2020-07-28 DIAGNOSIS — M797 Fibromyalgia: Secondary | ICD-10-CM | POA: Diagnosis not present

## 2020-08-11 DIAGNOSIS — G4733 Obstructive sleep apnea (adult) (pediatric): Secondary | ICD-10-CM | POA: Diagnosis not present

## 2020-10-05 ENCOUNTER — Encounter (INDEPENDENT_AMBULATORY_CARE_PROVIDER_SITE_OTHER): Payer: BC Managed Care – PPO | Admitting: Ophthalmology

## 2020-10-05 ENCOUNTER — Other Ambulatory Visit: Payer: Self-pay

## 2020-10-05 DIAGNOSIS — H43813 Vitreous degeneration, bilateral: Secondary | ICD-10-CM | POA: Diagnosis not present

## 2020-10-05 DIAGNOSIS — H2513 Age-related nuclear cataract, bilateral: Secondary | ICD-10-CM

## 2020-10-05 DIAGNOSIS — E113293 Type 2 diabetes mellitus with mild nonproliferative diabetic retinopathy without macular edema, bilateral: Secondary | ICD-10-CM | POA: Diagnosis not present

## 2020-10-09 ENCOUNTER — Other Ambulatory Visit: Payer: Self-pay | Admitting: Specialist

## 2020-10-28 DIAGNOSIS — R030 Elevated blood-pressure reading, without diagnosis of hypertension: Secondary | ICD-10-CM | POA: Diagnosis not present

## 2020-10-28 DIAGNOSIS — M797 Fibromyalgia: Secondary | ICD-10-CM | POA: Diagnosis not present

## 2020-10-28 DIAGNOSIS — M542 Cervicalgia: Secondary | ICD-10-CM | POA: Diagnosis not present

## 2020-10-28 DIAGNOSIS — G43709 Chronic migraine without aura, not intractable, without status migrainosus: Secondary | ICD-10-CM | POA: Diagnosis not present

## 2020-11-01 ENCOUNTER — Encounter: Payer: Self-pay | Admitting: Occupational Therapy

## 2020-11-01 NOTE — Therapy (Unsigned)
[facility] [address] [place], [place], [address] Phone: [phone]   Fax:  [phone]  November 01, 2020    No Recipients  Occupational Therapy Discharge Summary   Patient: Holly [name] MRN: [id number] Date of Birth: Jul 19, 1961  Diagnosis: No diagnosis found.  Referring Provider (OT): Dr Otelia Sergeant  Patient has not been seen since 01/11/20.  She was approaching the end of her plan of care for OT.      Sincerely,  Lovelace Cerveny, Lanice Shirts, OT  CC No Recipients  [facility] [address] [place], [place], [address] Phone: [phone]   Fax:  [phone]  Patient: Holly [name] MRN: [id number] Date of Birth: [birth year]

## 2020-11-28 DIAGNOSIS — G4733 Obstructive sleep apnea (adult) (pediatric): Secondary | ICD-10-CM | POA: Diagnosis not present

## 2020-11-28 DIAGNOSIS — G4721 Circadian rhythm sleep disorder, delayed sleep phase type: Secondary | ICD-10-CM | POA: Diagnosis not present

## 2020-12-02 DIAGNOSIS — G4733 Obstructive sleep apnea (adult) (pediatric): Secondary | ICD-10-CM | POA: Diagnosis not present

## 2020-12-07 DIAGNOSIS — E113292 Type 2 diabetes mellitus with mild nonproliferative diabetic retinopathy without macular edema, left eye: Secondary | ICD-10-CM | POA: Diagnosis not present

## 2020-12-07 DIAGNOSIS — H5213 Myopia, bilateral: Secondary | ICD-10-CM | POA: Diagnosis not present

## 2020-12-07 DIAGNOSIS — H2513 Age-related nuclear cataract, bilateral: Secondary | ICD-10-CM | POA: Diagnosis not present

## 2020-12-07 DIAGNOSIS — H524 Presbyopia: Secondary | ICD-10-CM | POA: Diagnosis not present

## 2021-01-03 DIAGNOSIS — G4721 Circadian rhythm sleep disorder, delayed sleep phase type: Secondary | ICD-10-CM | POA: Diagnosis not present

## 2021-01-24 DIAGNOSIS — R030 Elevated blood-pressure reading, without diagnosis of hypertension: Secondary | ICD-10-CM | POA: Diagnosis not present

## 2021-01-24 DIAGNOSIS — M545 Low back pain, unspecified: Secondary | ICD-10-CM | POA: Diagnosis not present

## 2021-01-24 DIAGNOSIS — M542 Cervicalgia: Secondary | ICD-10-CM | POA: Diagnosis not present

## 2021-01-24 DIAGNOSIS — G43709 Chronic migraine without aura, not intractable, without status migrainosus: Secondary | ICD-10-CM | POA: Diagnosis not present

## 2021-01-24 DIAGNOSIS — M797 Fibromyalgia: Secondary | ICD-10-CM | POA: Diagnosis not present

## 2021-02-14 DIAGNOSIS — G4721 Circadian rhythm sleep disorder, delayed sleep phase type: Secondary | ICD-10-CM | POA: Diagnosis not present

## 2021-02-20 ENCOUNTER — Other Ambulatory Visit: Payer: Self-pay | Admitting: Gastroenterology

## 2021-02-20 DIAGNOSIS — R195 Other fecal abnormalities: Secondary | ICD-10-CM | POA: Diagnosis not present

## 2021-02-20 DIAGNOSIS — R194 Change in bowel habit: Secondary | ICD-10-CM | POA: Diagnosis not present

## 2021-02-20 DIAGNOSIS — K219 Gastro-esophageal reflux disease without esophagitis: Secondary | ICD-10-CM | POA: Diagnosis not present

## 2021-02-20 DIAGNOSIS — R1084 Generalized abdominal pain: Secondary | ICD-10-CM | POA: Diagnosis not present

## 2021-02-20 DIAGNOSIS — K7460 Unspecified cirrhosis of liver: Secondary | ICD-10-CM

## 2021-02-21 ENCOUNTER — Ambulatory Visit
Admission: RE | Admit: 2021-02-21 | Discharge: 2021-02-21 | Disposition: A | Discharge status: Home / Self Care | Payer: BC Managed Care – PPO | Source: Ambulatory Visit | Attending: Gastroenterology | Admitting: Gastroenterology

## 2021-02-21 DIAGNOSIS — K7460 Unspecified cirrhosis of liver: Secondary | ICD-10-CM

## 2021-02-28 DIAGNOSIS — E039 Hypothyroidism, unspecified: Secondary | ICD-10-CM | POA: Diagnosis not present

## 2021-02-28 DIAGNOSIS — E1165 Type 2 diabetes mellitus with hyperglycemia: Secondary | ICD-10-CM | POA: Diagnosis not present

## 2021-02-28 DIAGNOSIS — E785 Hyperlipidemia, unspecified: Secondary | ICD-10-CM | POA: Diagnosis not present

## 2021-02-28 DIAGNOSIS — F329 Major depressive disorder, single episode, unspecified: Secondary | ICD-10-CM | POA: Diagnosis not present

## 2021-03-02 DIAGNOSIS — G4733 Obstructive sleep apnea (adult) (pediatric): Secondary | ICD-10-CM | POA: Diagnosis not present

## 2021-04-22 ENCOUNTER — Encounter (HOSPITAL_COMMUNITY): Payer: Self-pay | Admitting: Emergency Medicine

## 2021-04-22 ENCOUNTER — Emergency Department (HOSPITAL_COMMUNITY)
Admission: EM | Admit: 2021-04-22 | Discharge: 2021-04-22 | Disposition: A | Discharge status: Home / Self Care | Payer: BC Managed Care – PPO | Attending: Emergency Medicine | Admitting: Emergency Medicine

## 2021-04-22 ENCOUNTER — Emergency Department (HOSPITAL_COMMUNITY): Payer: BC Managed Care – PPO

## 2021-04-22 ENCOUNTER — Other Ambulatory Visit: Payer: Self-pay

## 2021-04-22 DIAGNOSIS — Z79899 Other long term (current) drug therapy: Secondary | ICD-10-CM | POA: Diagnosis not present

## 2021-04-22 DIAGNOSIS — S63630A Sprain of interphalangeal joint of right index finger, initial encounter: Secondary | ICD-10-CM | POA: Diagnosis not present

## 2021-04-22 DIAGNOSIS — Z87891 Personal history of nicotine dependence: Secondary | ICD-10-CM | POA: Diagnosis not present

## 2021-04-22 DIAGNOSIS — W231XXA Caught, crushed, jammed, or pinched between stationary objects, initial encounter: Secondary | ICD-10-CM | POA: Diagnosis not present

## 2021-04-22 DIAGNOSIS — E119 Type 2 diabetes mellitus without complications: Secondary | ICD-10-CM | POA: Diagnosis not present

## 2021-04-22 DIAGNOSIS — E039 Hypothyroidism, unspecified: Secondary | ICD-10-CM | POA: Insufficient documentation

## 2021-04-22 DIAGNOSIS — Z7984 Long term (current) use of oral hypoglycemic drugs: Secondary | ICD-10-CM | POA: Insufficient documentation

## 2021-04-22 DIAGNOSIS — S6991XA Unspecified injury of right wrist, hand and finger(s), initial encounter: Secondary | ICD-10-CM | POA: Diagnosis not present

## 2021-04-22 DIAGNOSIS — M7989 Other specified soft tissue disorders: Secondary | ICD-10-CM | POA: Diagnosis not present

## 2021-04-22 NOTE — ED Triage Notes (Signed)
While vacuuming last night, the patient right index finger was caught on some furniture. Today she noticed swelling, throbbing pain and decreased movement.

## 2021-04-22 NOTE — ED Provider Notes (Signed)
[facility]EMERGENCY DEPT Provider Note   CSN: [id number] Arrival date & time: 04/22/21  2033     History Chief Complaint  Patient presents with   Finger Injury    Holly [name] is a 60 y.o. female.  59 year old female presents with complaint of right index finger injury.  Patient states that she was vacuuming yesterday when her finger hit the pew resulting in pain to the PIP with a small area of swelling.  Reports feels tight if she tries to fully flex the finger, is able to fully extend.  Patient is right-hand dominant.  No other injuries.      Past Medical History:  Diagnosis Date   Anxiety    takes Ativan daily   Back pain    stenosis and buldging disc   Bone spur    neck and buldging disc   Depression    takes Cymbalta daily   Diabetes (HCC)    takes Metformin daily   Diverticulosis    GERD (gastroesophageal reflux disease)    takes Prevacid daily   History of bronchitis    > 5 yrs ago   History of hiatal hernia    Hypothyroidism    takes Synthroid daily   IBS (irritable bowel syndrome)    Insomnia    doesn't take any meds   Joint pain    Lung nodule    right middle lobe-was being followed by Dr.Burney.Medical Md is keeping up with this   Migraine    Migraine    last one 08/28/15   Nocturia    Numbness and tingling in hands    PONV (postoperative nausea and vomiting)    Sleep apnea    Thoracic outlet syndrome    TOS (thoracic outlet syndrome)    Urinary urgency     Patient Active Problem List   Diagnosis Date Noted   Carpal tunnel syndrome, left upper limb 08/11/2019    Class: Chronic   DJD (degenerative joint disease), cervical 09/18/2016   Other fatigue 09/18/2016   Fibromyalgia 09/18/2016   Primary osteoarthritis of both knees 09/18/2016   Primary insomnia 09/18/2016   Vitamin D deficiency 09/18/2016   Carpal tunnel syndrome, right 09/05/2015    Class: Chronic   Trigger finger, left middle finger  09/05/2015    Class: Chronic   Pulmonary nodules 01/16/2013   Dyspnea 01/13/2013    Past Surgical History:  Procedure Laterality Date   ABDOMINAL EXPLORATION SURGERY     cancer cells in cervix   CARPAL TUNNEL RELEASE Right 09/05/2015   Procedure: RIGHT OPEN CARPAL TUNNEL RELEASE, RIGHT LONG FINGER TRIGGER RELEASE,RIGHT INDEX FINGER TRIGGER RELEASE;  Surgeon: Kerrin Champagne, MD;  Location: [facility] OR;  Service: Orthopedics;  Laterality: Right;   CARPAL TUNNEL RELEASE Left 08/11/2019   Procedure: LEFT OPEN CARPAL TUNNEL RELEASE AND LEFT LONG FINGER TRIGGER FINGER RELEASE;  Surgeon: Kerrin Champagne, MD;  Location: [facility];  Service: Orthopedics;  Laterality: Left;   CHOLECYSTECTOMY     COLONOSCOPY     ESOPHAGOGASTRODUODENOSCOPY     THYROIDECTOMY     TONSILLECTOMY     adenoidectomy   TRIGGER FINGER RELEASE Right 09/05/2015   Procedure: RELEASE TRIGGER FINGER/A-1 PULLEY;  Surgeon: Kerrin Champagne, MD;  Location: [facility] OR;  Service: Orthopedics;  Laterality: Right;   TRIGGER FINGER RELEASE Left 08/11/2019   Procedure: RELEASE TRIGGER FINGER/A-1 PULLEY;  Surgeon: Kerrin Champagne, MD;  Location: [facility];  Service: Orthopedics;  Laterality: Left;  OB History   No obstetric history on file.     Family History  Problem Relation Age of Onset   COPD Mother    Cancer Mother    Heart disease Paternal Grandmother    Heart disease Paternal Grandfather    Heart disease Maternal Grandmother    Heart disease Maternal Grandfather     Social History   Tobacco Use   Smoking status: Former    Types: Cigarettes   Smokeless tobacco: Never  Substance Use Topics   Alcohol use: No   Drug use: No    Home Medications Prior to Admission medications   Medication Sig Start Date End Date Taking? Authorizing Provider  acetaminophen (TYLENOL) 500 MG tablet Take 500 mg by mouth every 6 (six) hours as needed.    [provider]  amitriptyline (ELAVIL) 10 MG tablet  TAKE 1 TABLET BY MOUTH EVERYDAY AT BEDTIME 10/08/19   Kerrin Champagne, MD  B Complex-Folic Acid (B COMPLEX FORMULA 1) TABS Take by mouth.    [provider]  clobetasol ointment (TEMOVATE) 0.05 % clobetasol 0.05 % topical ointment 01/23/16   [provider]  conjugated estrogens (PREMARIN) vaginal cream Place vaginally. 12/06/14   [provider]  diclofenac Sodium (VOLTAREN) 1 % GEL APPLY 2 GRAMS TO AFFECTED AREA 4 TIMES A DAY 10/09/19   Kerrin Champagne, MD  DiphenhydrAMINE HCl (BENADRYL ALLERGY PO) Take by mouth as needed. Taken with advil for migraine management.    [provider]  famotidine (PEPCID) 20 MG tablet Take 20 mg by mouth 2 (two) times daily.    [provider]  glucose blood (ONE TOUCH ULTRA TEST) test strip  01/10/15   [provider]  ibuprofen (ADVIL) 800 MG tablet TAKE 1 TABLET (800 MG TOTAL) BY MOUTH 2 (TWO) TIMES DAILY AFTER A MEAL. 10/12/20   Kerrin Champagne, MD  LORazepam (ATIVAN) 2 MG tablet Take 2 mg by mouth 4 (four) times daily.    [provider]  metFORMIN (GLUCOPHAGE) 500 MG tablet Take 1,000 mg by mouth 2 (two) times daily with a meal.     [provider]  OnabotulinumtoxinA (BOTOX IJ) Inject as directed every 3 (three) months. For migraine prevention.    [provider]  SUBOXONE 8-2 MG FILM Place 3 Film under the tongue daily.  08/12/15   [provider]  SYNTHROID 112 MCG tablet Take 112 mcg by mouth daily. 04/20/16   [provider]    Allergies    Cefuroxime axetil, Effexor xr [venlafaxine hcl er], Sumatriptan, Erenumab-aooe, Garlic, Onion, Statins, Crestor [rosuvastatin calcium], and Triptans  Review of Systems   Review of Systems  Constitutional:  Negative for fever.  Musculoskeletal:  Positive for arthralgias and joint swelling. Negative for myalgias.  Skin:  Negative for color change, rash and wound.  Allergic/Immunologic: Negative for immunocompromised state.   Neurological:  Negative for weakness and numbness.   Physical Exam Updated Vital Signs BP 140/84 (BP Location: Right Arm)   Pulse 79   Temp 98.9 F (37.2 C) (Oral)   Resp 18   SpO2 96%   Physical Exam Vitals and nursing note reviewed.  Constitutional:      General: She is not in acute distress.    Appearance: She is well-developed. She is not diaphoretic.  HENT:     Head: Normocephalic and atraumatic.  Cardiovascular:     Pulses: Normal pulses.  Pulmonary:     Effort: Pulmonary effort is normal.  Musculoskeletal:  General: Swelling, tenderness and signs of injury present. No deformity.     Comments: Mild swelling and tenderness of the right index finger PIP with small area of tenderness to the radial aspect of the joint.  Able to fully flex and extend the finger, no weakness or loss of sensation, brisk capillary refill present.  Skin:    General: Skin is warm and dry.     Findings: No erythema.  Neurological:     Mental Status: She is alert and oriented to person, place, and time.     Sensory: No sensory deficit.     Motor: No weakness.  Psychiatric:        Behavior: Behavior normal.    ED Results / Procedures / Treatments   Labs (all labs ordered are listed, but only abnormal results are displayed) Labs Reviewed - No data to display  EKG None  Radiology DG Finger Index Right  Result Date: 04/22/2021 CLINICAL DATA:  Injury to the right index finger while vacuuming last night. Finger caught in some furniture. Today with pain and swelling. EXAM: RIGHT INDEX FINGER 2+V COMPARISON:  None. FINDINGS: No fracture or bone lesion. Joints normally spaced and aligned. There is soft tissue swelling of the index finger. IMPRESSION: 1. No fracture or dislocation. Electronically Signed   By: Amie Portland M.D.   On: 04/22/2021 22:01    Procedures Procedures   Medications Ordered in ED Medications - No data to display  ED Course  I have reviewed the triage vital signs  and the nursing notes.  Pertinent labs & imaging results that were available during my care of the patient were reviewed by me and considered in my medical decision making (see chart for details).  Clinical Course as of 04/22/21 2223  Sat Apr 22, 2021  7063 60 year old right-hand-dominant female with right index finger injury yesterday.  X-ray is negative for fracture.  Has tenderness at the PIP, plan is to buddy tape and refer to hand if not improving. [LM]    Clinical Course User Index [LM] Alden Hipp   MDM Rules/Calculators/A&P                            Final Clinical Impression(s) / ED Diagnoses Final diagnoses:  Sprain of interphalangeal joint of right index finger, initial encounter    Rx / DC Orders ED Discharge Orders     None        Alden Hipp 04/22/21 2224    Linwood Dibbles, MD 04/24/21 [phone]

## 2021-05-02 DIAGNOSIS — M797 Fibromyalgia: Secondary | ICD-10-CM | POA: Diagnosis not present

## 2021-05-02 DIAGNOSIS — M542 Cervicalgia: Secondary | ICD-10-CM | POA: Diagnosis not present

## 2021-05-02 DIAGNOSIS — G43709 Chronic migraine without aura, not intractable, without status migrainosus: Secondary | ICD-10-CM | POA: Diagnosis not present

## 2021-05-02 DIAGNOSIS — F419 Anxiety disorder, unspecified: Secondary | ICD-10-CM | POA: Diagnosis not present

## 2021-05-02 DIAGNOSIS — S63630A Sprain of interphalangeal joint of right index finger, initial encounter: Secondary | ICD-10-CM | POA: Diagnosis not present

## 2021-05-02 DIAGNOSIS — F32A Depression, unspecified: Secondary | ICD-10-CM | POA: Diagnosis not present

## 2021-05-09 DIAGNOSIS — D120 Benign neoplasm of cecum: Secondary | ICD-10-CM | POA: Diagnosis not present

## 2021-05-09 DIAGNOSIS — K635 Polyp of colon: Secondary | ICD-10-CM | POA: Diagnosis not present

## 2021-05-09 DIAGNOSIS — K625 Hemorrhage of anus and rectum: Secondary | ICD-10-CM | POA: Diagnosis not present

## 2021-05-09 DIAGNOSIS — R194 Change in bowel habit: Secondary | ICD-10-CM | POA: Diagnosis not present

## 2021-06-02 DIAGNOSIS — G4733 Obstructive sleep apnea (adult) (pediatric): Secondary | ICD-10-CM | POA: Diagnosis not present

## 2021-06-21 DIAGNOSIS — N3941 Urge incontinence: Secondary | ICD-10-CM | POA: Diagnosis not present

## 2021-06-21 DIAGNOSIS — L292 Pruritus vulvae: Secondary | ICD-10-CM | POA: Diagnosis not present

## 2021-06-21 DIAGNOSIS — N898 Other specified noninflammatory disorders of vagina: Secondary | ICD-10-CM | POA: Diagnosis not present

## 2021-07-02 DIAGNOSIS — G4733 Obstructive sleep apnea (adult) (pediatric): Secondary | ICD-10-CM | POA: Diagnosis not present

## 2021-07-06 DIAGNOSIS — L700 Acne vulgaris: Secondary | ICD-10-CM | POA: Diagnosis not present

## 2021-07-12 DIAGNOSIS — N952 Postmenopausal atrophic vaginitis: Secondary | ICD-10-CM | POA: Diagnosis not present

## 2021-07-12 DIAGNOSIS — Z01419 Encounter for gynecological examination (general) (routine) without abnormal findings: Secondary | ICD-10-CM | POA: Diagnosis not present

## 2021-07-12 DIAGNOSIS — Z87440 Personal history of urinary (tract) infections: Secondary | ICD-10-CM | POA: Diagnosis not present

## 2021-07-12 DIAGNOSIS — N898 Other specified noninflammatory disorders of vagina: Secondary | ICD-10-CM | POA: Diagnosis not present

## 2021-07-26 DIAGNOSIS — M542 Cervicalgia: Secondary | ICD-10-CM | POA: Diagnosis not present

## 2021-07-26 DIAGNOSIS — G43709 Chronic migraine without aura, not intractable, without status migrainosus: Secondary | ICD-10-CM | POA: Diagnosis not present

## 2021-07-26 DIAGNOSIS — M797 Fibromyalgia: Secondary | ICD-10-CM | POA: Diagnosis not present

## 2021-08-01 DIAGNOSIS — D2262 Melanocytic nevi of left upper limb, including shoulder: Secondary | ICD-10-CM | POA: Diagnosis not present

## 2021-08-01 DIAGNOSIS — L570 Actinic keratosis: Secondary | ICD-10-CM | POA: Diagnosis not present

## 2021-08-01 DIAGNOSIS — L821 Other seborrheic keratosis: Secondary | ICD-10-CM | POA: Diagnosis not present

## 2021-08-01 DIAGNOSIS — L718 Other rosacea: Secondary | ICD-10-CM | POA: Diagnosis not present

## 2021-08-02 DIAGNOSIS — G4733 Obstructive sleep apnea (adult) (pediatric): Secondary | ICD-10-CM | POA: Diagnosis not present

## 2021-09-01 ENCOUNTER — Ambulatory Visit: Payer: BC Managed Care – PPO | Admitting: Specialist

## 2021-10-05 ENCOUNTER — Encounter (INDEPENDENT_AMBULATORY_CARE_PROVIDER_SITE_OTHER): Payer: BC Managed Care – PPO | Admitting: Ophthalmology

## 2021-10-05 ENCOUNTER — Other Ambulatory Visit: Payer: Self-pay

## 2021-10-05 DIAGNOSIS — E113293 Type 2 diabetes mellitus with mild nonproliferative diabetic retinopathy without macular edema, bilateral: Secondary | ICD-10-CM | POA: Diagnosis not present

## 2021-10-05 DIAGNOSIS — H43813 Vitreous degeneration, bilateral: Secondary | ICD-10-CM

## 2021-10-05 DIAGNOSIS — H2513 Age-related nuclear cataract, bilateral: Secondary | ICD-10-CM | POA: Diagnosis not present

## 2021-10-17 DIAGNOSIS — E1165 Type 2 diabetes mellitus with hyperglycemia: Secondary | ICD-10-CM | POA: Diagnosis not present

## 2021-10-17 DIAGNOSIS — G4700 Insomnia, unspecified: Secondary | ICD-10-CM | POA: Diagnosis not present

## 2021-10-17 DIAGNOSIS — E785 Hyperlipidemia, unspecified: Secondary | ICD-10-CM | POA: Diagnosis not present

## 2021-10-17 DIAGNOSIS — E890 Postprocedural hypothyroidism: Secondary | ICD-10-CM | POA: Diagnosis not present

## 2021-10-20 DIAGNOSIS — H903 Sensorineural hearing loss, bilateral: Secondary | ICD-10-CM | POA: Diagnosis not present

## 2021-10-20 DIAGNOSIS — Z87891 Personal history of nicotine dependence: Secondary | ICD-10-CM | POA: Diagnosis not present

## 2021-10-24 ENCOUNTER — Other Ambulatory Visit: Payer: Self-pay | Admitting: Otolaryngology

## 2021-10-24 DIAGNOSIS — H9122 Sudden idiopathic hearing loss, left ear: Secondary | ICD-10-CM

## 2021-10-26 DIAGNOSIS — M797 Fibromyalgia: Secondary | ICD-10-CM | POA: Diagnosis not present

## 2021-10-26 DIAGNOSIS — M545 Low back pain, unspecified: Secondary | ICD-10-CM | POA: Diagnosis not present

## 2021-10-26 DIAGNOSIS — M542 Cervicalgia: Secondary | ICD-10-CM | POA: Diagnosis not present

## 2021-10-26 DIAGNOSIS — G43709 Chronic migraine without aura, not intractable, without status migrainosus: Secondary | ICD-10-CM | POA: Diagnosis not present

## 2021-11-06 ENCOUNTER — Telehealth: Payer: Self-pay | Admitting: Gastroenterology

## 2021-11-06 NOTE — Telephone Encounter (Addendum)
Hi Dr. Silverio Decamp, this patient brought her GI records from Dr. Therisa Doyne today. She is interested in a second opinion with you because a friend of hers, who is your patient, spoke highly about you. She has been experiencing issues with her esophagus. Records will be placed on your desk for review and advise on scheduling. Thank you.

## 2021-11-13 ENCOUNTER — Institutional Professional Consult (permissible substitution): Payer: BC Managed Care – PPO | Admitting: Plastic Surgery

## 2021-11-23 ENCOUNTER — Ambulatory Visit: Payer: BC Managed Care – PPO | Admitting: Surgery

## 2021-11-28 DIAGNOSIS — G4733 Obstructive sleep apnea (adult) (pediatric): Secondary | ICD-10-CM | POA: Diagnosis not present

## 2021-11-30 ENCOUNTER — Ambulatory Visit: Payer: Self-pay

## 2021-11-30 ENCOUNTER — Ambulatory Visit (INDEPENDENT_AMBULATORY_CARE_PROVIDER_SITE_OTHER): Payer: BC Managed Care – PPO | Admitting: Surgery

## 2021-11-30 ENCOUNTER — Ambulatory Visit: Payer: BC Managed Care – PPO | Admitting: Surgery

## 2021-11-30 ENCOUNTER — Other Ambulatory Visit: Payer: Self-pay

## 2021-11-30 ENCOUNTER — Encounter: Payer: Self-pay | Admitting: Surgery

## 2021-11-30 VITALS — BP 129/79 | HR 71 | Ht 65.0 in | Wt 209.0 lb

## 2021-11-30 DIAGNOSIS — M25512 Pain in left shoulder: Secondary | ICD-10-CM

## 2021-11-30 DIAGNOSIS — M542 Cervicalgia: Secondary | ICD-10-CM

## 2021-11-30 DIAGNOSIS — M5412 Radiculopathy, cervical region: Secondary | ICD-10-CM

## 2021-12-04 ENCOUNTER — Institutional Professional Consult (permissible substitution): Payer: BC Managed Care – PPO | Admitting: Plastic Surgery

## 2021-12-12 NOTE — Progress Notes (Signed)
? ?Office Visit Note ?  ?Patient: Holly [name]           ?Date of Birth: 04-03-1961           ?MRN: [id number] ?Visit Date: 11/30/2021 ?             ?Requested by: Josetta Huddle, MD ?[address] ?[address] ?[place],  [place] [address] ?PCP: Josetta Huddle, MD ? ? ?Assessment & Plan: ?Visit Diagnoses:  ?1. Acute pain of left shoulder   ?2. Cervicalgia   ?3. Radiculopathy, cervical region   ? ? ?Plan: With patient's ongoing chronic symptoms I recommended proceeding with cervical MRI and comparing to the previous study that was done May 2019.  After having long discussion with patient she elected to hold off on scheduling the study and would like to discuss this further with Dr. Louanne Skye.  She did mention having an open scanner which I was fine with ordering.  I do think that the symptoms that she is having is related to chronic issues with her neck. ? ?Follow-Up Instructions: Return in about 5 weeks (around 01/04/2022) for WITH DR NITKA FOR RECHECK CERVICAL SPINE AND LEFT UE RADICULOPATHY.  ? ?Orders:  ?Orders Placed This Encounter  ?Procedures  ? XR Shoulder Left  ? XR Cervical Spine 2 or 3 views  ? ?No orders of the defined types were placed in this encounter. ? ? ? ? Procedures: ?No procedures performed ? ? ?Clinical Data: ?No additional findings. ? ? ?Subjective: ?Chief Complaint  ?Patient presents with  ? Left Hand - Pain  ? ? ?HPI ?61 year old white female comes in today with complaints of chronic and worsening left-sided neck pain and left upper extremity radiculopathy with the left arm weakness.  Patient had a cervical spine MRI scan Feb 15, 2018 which showed: ? ?CLINICAL DATA:  Initial evaluation for neck and left shoulder pain ?for years, progressively worsening. ?  ?EXAM: ?MRI CERVICAL SPINE WITHOUT CONTRAST ?  ?TECHNIQUE: ?Multiplanar, multisequence MR imaging of the cervical spine was ?performed. No intravenous contrast was administered. ?  ?COMPARISON:  Prior radiographs from 02/07/2018. ?   ?FINDINGS: ?Alignment: Straightening of the normal cervical lordosis. No ?listhesis. ?  ?Vertebrae: Vertebral body heights maintained without evidence for ?acute or chronic fracture. Bone marrow signal intensity within ?normal limits. Benign hemangiomas noted within the C6 and T3 ?vertebral bodies. No worrisome osseous lesions. ?  ?Cord: Signal intensity within the cervical spinal cord is normal. ?  ?Posterior Fossa, vertebral arteries, paraspinal tissues: Visualized ?brain and posterior fossa within normal limits. Craniocervical ?junction normal. Paraspinous and prevertebral soft tissues normal. ?Normal intravascular flow voids present within the vertebral ?arteries bilaterally. ?  ?Disc levels: ?  ?C2-C3: Unremarkable. ?  ?C3-C4:  Mild annular disc bulge.  No significant stenosis. ?  ?C4-C5: Mild disc bulge with bilateral uncovertebral hypertrophy. No ?canal stenosis. Mild bilateral C5 foraminal narrowing. ?  ?C5-C6: Mild disc bulge with bilateral uncovertebral hypertrophy. No ?significant canal stenosis. Mild bilateral C6 foraminal narrowing. ?  ?C6-C7: Broad left paracentral disc protrusion indents the left ?ventral thecal sac. Secondary mild flattening of the left hemi cord ?without cord signal changes. Mild left-sided spinal stenosis. Mild ?left C7 foraminal stenosis. No right foraminal encroachment. ?  ?C7-T1: Bilateral facet hypertrophy. No canal stenosis. No ?significant foraminal encroachment. ?  ?Visualized upper thoracic spine within normal limits. ?  ?IMPRESSION: ?1. Shallow left paracentral disc protrusion at C6-7 with secondary ?mild flattening of the left hemi cord and mild left C7 foraminal ?  stenosis. Query left C7 radiculitis. ?2. Mild disc bulge and uncovertebral hypertrophy at C4-5 and C5-6 ?with resultant mild bilateral C5 and C6 foraminal stenosis. ?  ?  ?Electronically Signed ?  By: Jeannine Boga M.D. ?  On: 02/16/2018 06:58 ?  ? ?She had a left C7-T1 ESI with Dr. Ernestina Patches April 09, 2018.   Patient also has a history of chronic migraines and is followed by [facility] neurology.  Left-sided neck pain radiates into the left shoulder and down to her hand.  States that she drops things.  No right arm symptoms.  Left shoulder pain described mostly as being constant and not so much with overhead activity.  Last seen by Dr. Louanne Skye April 2021. ?Review of Systems ?No current cardiopulmonary GI/GU issues ? ?Objective: ?Vital Signs: BP 129/79   Pulse 71   Ht 5\' 5"  (1.651 m)   Wt 209 lb (94.8 kg)   BMI 34.78 kg/m?  ? ?Physical Exam ?HENT:  ?   Head: Normocephalic.  ?   Nose: Nose normal.  ?Pulmonary:  ?   Effort: No respiratory distress.  ?Musculoskeletal:  ?   Comments: Positive left brachial plexus trapezius tenderness.  Bilateral shoulder exam unremarkable.  Negative impingement test.  Trace left triceps, biceps, grip and wrist extension weakness with resistance.  ?Neurological:  ?   Mental Status: She is alert and oriented to person, place, and time.  ? ? ?Ortho Exam ? ?Specialty Comments:  ?No specialty comments available. ? ?Imaging: ?No results found. ? ? ?PMFS History: ?Patient Active Problem List  ? Diagnosis Date Noted  ? Carpal tunnel syndrome, left upper limb 08/11/2019  ?  Class: Chronic  ? DJD (degenerative joint disease), cervical 09/18/2016  ? Other fatigue 09/18/2016  ? Fibromyalgia 09/18/2016  ? Primary osteoarthritis of both knees 09/18/2016  ? Primary insomnia 09/18/2016  ? Vitamin D deficiency 09/18/2016  ? Carpal tunnel syndrome, right 09/05/2015  ?  Class: Chronic  ? Trigger finger, left middle finger 09/05/2015  ?  Class: Chronic  ? Pulmonary nodules 01/16/2013  ? Dyspnea 01/13/2013  ? ?Past Medical History:  ?Diagnosis Date  ? Anxiety   ? takes Ativan daily  ? Back pain   ? stenosis and buldging disc  ? Bone spur   ? neck and buldging disc  ? Depression   ? takes Cymbalta daily  ? Diabetes ([place])   ? takes Metformin daily  ? Diverticulosis   ? GERD (gastroesophageal reflux disease)   ? takes  Prevacid daily  ? History of bronchitis   ? > 5 yrs ago  ? History of hiatal hernia   ? Hypothyroidism   ? takes Synthroid daily  ? IBS (irritable bowel syndrome)   ? Insomnia   ? doesn't take any meds  ? Joint pain   ? Lung nodule   ? right middle lobe-was being followed by Dr.Burney.Medical Md is keeping up with this  ? Migraine   ? Migraine   ? last one 08/28/15  ? Nocturia   ? Numbness and tingling in hands   ? PONV (postoperative nausea and vomiting)   ? Sleep apnea   ? Thoracic outlet syndrome   ? TOS (thoracic outlet syndrome)   ? Urinary urgency   ?  ?Family History  ?Problem Relation Age of Onset  ? COPD Mother   ? Cancer Mother   ? Heart disease Paternal Grandmother   ? Heart disease Paternal Grandfather   ? Heart disease Maternal Grandmother   ?  Heart disease Maternal Grandfather   ?  ?Past Surgical History:  ?Procedure Laterality Date  ? ABDOMINAL EXPLORATION SURGERY    ? cancer cells in cervix  ? CARPAL TUNNEL RELEASE Right 09/05/2015  ? Procedure: RIGHT OPEN CARPAL TUNNEL RELEASE, RIGHT LONG FINGER TRIGGER RELEASE,RIGHT INDEX FINGER TRIGGER RELEASE;  Surgeon: Jessy Oto, MD;  Location: [facility];  Service: Orthopedics;  Laterality: Right;  ? CARPAL TUNNEL RELEASE Left 08/11/2019  ? Procedure: LEFT OPEN CARPAL TUNNEL RELEASE AND LEFT LONG FINGER TRIGGER FINGER RELEASE;  Surgeon: Jessy Oto, MD;  Location: [facility];  Service: Orthopedics;  Laterality: Left;  ? CHOLECYSTECTOMY    ? COLONOSCOPY    ? ESOPHAGOGASTRODUODENOSCOPY    ? THYROIDECTOMY    ? TONSILLECTOMY    ? adenoidectomy  ? TRIGGER FINGER RELEASE Right 09/05/2015  ? Procedure: RELEASE TRIGGER FINGER/A-1 PULLEY;  Surgeon: Jessy Oto, MD;  Location: [facility];  Service: Orthopedics;  Laterality: Right;  ? TRIGGER FINGER RELEASE Left 08/11/2019  ? Procedure: RELEASE TRIGGER FINGER/A-1 PULLEY;  Surgeon: Jessy Oto, MD;  Location: [facility];  Service: Orthopedics;  Laterality: Left;  ? ?Social History   ? ?Occupational History  ? Not on file  ?Tobacco Use  ? Smoking status: Former  ?  Types: Cigarettes  ? Smokeless tobacco: Never  ?Substance and Sexual Activity  ? Alcohol use: No  ? Drug use: No  ? Sexual activity: Yes

## 2021-12-17 DIAGNOSIS — G4733 Obstructive sleep apnea (adult) (pediatric): Secondary | ICD-10-CM | POA: Diagnosis not present

## 2021-12-22 DIAGNOSIS — G4733 Obstructive sleep apnea (adult) (pediatric): Secondary | ICD-10-CM | POA: Diagnosis not present

## 2021-12-22 DIAGNOSIS — H524 Presbyopia: Secondary | ICD-10-CM | POA: Diagnosis not present

## 2021-12-27 ENCOUNTER — Encounter: Payer: Self-pay | Admitting: Specialist

## 2021-12-27 ENCOUNTER — Other Ambulatory Visit: Payer: Self-pay

## 2021-12-27 ENCOUNTER — Ambulatory Visit (INDEPENDENT_AMBULATORY_CARE_PROVIDER_SITE_OTHER): Payer: BC Managed Care – PPO | Admitting: Specialist

## 2021-12-27 VITALS — BP 132/75 | HR 87 | Ht 65.0 in | Wt 209.0 lb

## 2021-12-27 DIAGNOSIS — M4722 Other spondylosis with radiculopathy, cervical region: Secondary | ICD-10-CM | POA: Diagnosis not present

## 2021-12-27 DIAGNOSIS — M542 Cervicalgia: Secondary | ICD-10-CM | POA: Diagnosis not present

## 2021-12-27 DIAGNOSIS — M5412 Radiculopathy, cervical region: Secondary | ICD-10-CM

## 2021-12-27 NOTE — Patient Instructions (Addendum)
Avoid overhead lifting and overhead use of the arms. Do not lift greater than 5-10 lbs. Adjust head rest in vehicle to prevent hyperextension if rear ended. Take extra precautions to avoid falling.   

## 2021-12-27 NOTE — Progress Notes (Signed)
? ?Office Visit Note ?  ?Patient: Holly [name]           ?Date of Birth: 1961/06/15           ?MRN: [id number] ?Visit Date: 12/27/2021 ?             ?Requested by: Josetta Huddle, MD ?[address] ?[address] ?[place],  [place] [address] ?PCP: Josetta Huddle, MD ? ? ?Assessment & Plan: ?Visit Diagnoses:  ?1. Radiculopathy, cervical region   ?2. Cervicalgia   ?3. Other spondylosis with radiculopathy, cervical region   ? ? ?Plan: Avoid overhead lifting and overhead use of the arms. ?Do not lift greater than 5 lbs. ?Adjust head rest in vehicle to prevent hyperextension if rear ended. ?Take extra precautions to avoid falling. ? ?Follow-Up Instructions: Return in about 3 weeks (around 01/17/2022).  ? ?Orders:  ?No orders of the defined types were placed in this encounter. ? ?No orders of the defined types were placed in this encounter. ? ? ? ? Procedures: ?No procedures performed ? ? ?Clinical Data: ?No additional findings. ? ? ?Subjective: ?Chief Complaint  ?Patient presents with  ? Neck - Follow-up  ? ? ?61 year old right handed female now 3 years post op left CTR and triggerfinger surgery. 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 ?She has been experiencing left hand difficulty with numbness and pain in to the left index and thumb and left axillary discomfort. She is able  to stand and walk without difficulty. She is ?Able to walk better and uses a rain drop Oil technique to improve the pain and stiffness. No bowel and bladder difficulty.  ? ? ?Review of Systems  ?Constitutional: Negative.   ?HENT: Negative.    ?Eyes: Negative.   ?Respiratory: Negative.    ?Cardiovascular: Negative.   ?Gastrointestinal: Negative.   ?Endocrine: Negative.   ?Genitourinary: Negative.   ?Musculoskeletal: Negative.   ?Skin: Negative.   ?Allergic/Immunologic: Negative.   ?Neurological: Negative.   ?Hematological: Negative.   ?Psychiatric/Behavioral: Negative.     ? ? ?Objective: ?Vital Signs: BP 132/75 (BP Location: Left Arm, Patient Position: Sitting)   Pulse 87   Ht 5\' 5"  (1.651 m)   Wt 209 lb (94.8 kg)   BMI 34.78 kg/m?  ? ?Physical Exam ?Constitutional:   ?   Appearance: She is well-developed.  ?HENT:  ?   Head: Normocephalic and atraumatic.  ?Eyes:  ?   Pupils: Pupils are equal, round, and reactive to light.  ?Pulmonary:  ?   Effort: Pulmonary effort is normal.  ?   Breath sounds: Normal breath sounds.  ?Abdominal:  ?   General: Bowel sounds are normal.  ?   Palpations: Abdomen is soft.  ?Musculoskeletal:  ?   Cervical back: Normal range of motion and neck supple.  ?Skin: ?   General: Skin is warm and dry.  ?Neurological:  ?   Mental Status: She is alert and oriented to person, place, and time.  ?Psychiatric:     ?   Behavior: Behavior normal.     ?   Thought Content: Thought content normal.     ?   Judgment: Judgment normal.  ? ? ?Back Exam  ? ?Tenderness  ?The patient is experiencing tenderness in the cervical. ? ?Range of Motion  ?Extension:  abnormal  ?Flexion:  normal  ?Lateral bend right:  abnormal  ?Lateral bend left:  normal  ?Rotation right:  abnormal  ?  Rotation left:  normal  ? ?Comments:  Left EDC weakness 5-/5 ? ? ? ? ?Specialty Comments:  ?No specialty comments available. ? ?Imaging: ?No results found. ? ? ?PMFS History: ?Patient Active Problem List  ? Diagnosis Date Noted  ? Carpal tunnel syndrome, left upper limb 08/11/2019  ?  Priority: High  ?  Class: Chronic  ? Trigger finger, left middle finger 09/05/2015  ?  Priority: High  ?  Class: Chronic  ? Carpal tunnel syndrome, right 09/05/2015  ?  Priority: Low  ?  Class: Chronic  ? DJD (degenerative joint disease), cervical 09/18/2016  ? Other fatigue 09/18/2016  ? Fibromyalgia 09/18/2016  ? Primary osteoarthritis of both knees 09/18/2016  ? Primary insomnia 09/18/2016  ? Vitamin D deficiency 09/18/2016  ? Pulmonary nodules 01/16/2013  ? Dyspnea 01/13/2013  ? ?Past Medical History:  ?Diagnosis Date  ?  Anxiety   ? takes Ativan daily  ? Back pain   ? stenosis and buldging disc  ? Bone spur   ? neck and buldging disc  ? Depression   ? takes Cymbalta daily  ? Diabetes ([place])   ? takes Metformin daily  ? Diverticulosis   ? GERD (gastroesophageal reflux disease)   ? takes Prevacid daily  ? History of bronchitis   ? > 5 yrs ago  ? History of hiatal hernia   ? Hypothyroidism   ? takes Synthroid daily  ? IBS (irritable bowel syndrome)   ? Insomnia   ? doesn't take any meds  ? Joint pain   ? Lung nodule   ? right middle lobe-was being followed by Dr.Burney.Medical Md is keeping up with this  ? Migraine   ? Migraine   ? last one 08/28/15  ? Nocturia   ? Numbness and tingling in hands   ? PONV (postoperative nausea and vomiting)   ? Sleep apnea   ? Thoracic outlet syndrome   ? TOS (thoracic outlet syndrome)   ? Urinary urgency   ?  ?Family History  ?Problem Relation Age of Onset  ? COPD Mother   ? Cancer Mother   ? Heart disease Paternal Grandmother   ? Heart disease Paternal Grandfather   ? Heart disease Maternal Grandmother   ? Heart disease Maternal Grandfather   ?  ?Past Surgical History:  ?Procedure Laterality Date  ? ABDOMINAL EXPLORATION SURGERY    ? cancer cells in cervix  ? CARPAL TUNNEL RELEASE Right 09/05/2015  ? Procedure: RIGHT OPEN CARPAL TUNNEL RELEASE, RIGHT LONG FINGER TRIGGER RELEASE,RIGHT INDEX FINGER TRIGGER RELEASE;  Surgeon: Jessy Oto, MD;  Location: [facility];  Service: Orthopedics;  Laterality: Right;  ? CARPAL TUNNEL RELEASE Left 08/11/2019  ? Procedure: LEFT OPEN CARPAL TUNNEL RELEASE AND LEFT LONG FINGER TRIGGER FINGER RELEASE;  Surgeon: Jessy Oto, MD;  Location: [facility];  Service: Orthopedics;  Laterality: Left;  ? CHOLECYSTECTOMY    ? COLONOSCOPY    ? ESOPHAGOGASTRODUODENOSCOPY    ? THYROIDECTOMY    ? TONSILLECTOMY    ? adenoidectomy  ? TRIGGER FINGER RELEASE Right 09/05/2015  ? Procedure: RELEASE TRIGGER FINGER/A-1 PULLEY;  Surgeon: Jessy Oto, MD;  Location: [facility];   Service: Orthopedics;  Laterality: Right;  ? TRIGGER FINGER RELEASE Left 08/11/2019  ? Procedure: RELEASE TRIGGER FINGER/A-1 PULLEY;  Surgeon: Jessy Oto, MD;  Location: [facility];  Service: Orthopedics;  Laterality: Left;  ? ?Social History  ? ?Occupational History  ? Not on file  ?Tobacco Use  ?  Smoking status: Former  ?  Types: Cigarettes  ? Smokeless tobacco: Never  ?Substance and Sexual Activity  ? Alcohol use: No  ? Drug use: No  ? Sexual activity: Yes  ?  Birth control/protection: Post-menopausal  ? ? ? ? ? ? ?

## 2022-01-01 ENCOUNTER — Telehealth: Payer: Self-pay | Admitting: Specialist

## 2022-01-01 NOTE — Telephone Encounter (Signed)
Per  Martie Lee ---[2:19 PM] Holly [name] ?i had to change her order per [facility] imaging protocol. I changed that this a.m pt can call them or wait for them to call her. [phone] ? ? ?Advised pt  ? ? ? ? ? ? ? ? ?

## 2022-01-11 ENCOUNTER — Other Ambulatory Visit: Payer: BC Managed Care – PPO

## 2022-01-12 NOTE — Telephone Encounter (Signed)
Patient is following up on whether records have been received. Could you look to see if they are still on Dr. Lavon Paganini desk ?

## 2022-01-12 NOTE — Telephone Encounter (Signed)
Yes they are on her desk. She has been on vacation in [place] will be here Monday. I will personally get her to look at them Monday  ?

## 2022-01-15 ENCOUNTER — Ambulatory Visit: Payer: BC Managed Care – PPO | Admitting: Specialist

## 2022-01-19 DIAGNOSIS — E785 Hyperlipidemia, unspecified: Secondary | ICD-10-CM | POA: Diagnosis not present

## 2022-01-19 DIAGNOSIS — K219 Gastro-esophageal reflux disease without esophagitis: Secondary | ICD-10-CM | POA: Diagnosis not present

## 2022-01-19 DIAGNOSIS — E890 Postprocedural hypothyroidism: Secondary | ICD-10-CM | POA: Diagnosis not present

## 2022-01-19 DIAGNOSIS — E1165 Type 2 diabetes mellitus with hyperglycemia: Secondary | ICD-10-CM | POA: Diagnosis not present

## 2022-01-19 DIAGNOSIS — H524 Presbyopia: Secondary | ICD-10-CM | POA: Diagnosis not present

## 2022-01-19 NOTE — Telephone Encounter (Signed)
Left message to call back.. Dr. Lavon Paganini reviewed records and stated: "No, cannot accommodate request for transfer." ? ?Records will be shredded.  ?

## 2022-01-26 ENCOUNTER — Ambulatory Visit
Admission: RE | Admit: 2022-01-26 | Discharge: 2022-01-26 | Disposition: A | Discharge status: Home / Self Care | Payer: BC Managed Care – PPO | Source: Ambulatory Visit | Attending: Specialist | Admitting: Specialist

## 2022-01-26 DIAGNOSIS — M4722 Other spondylosis with radiculopathy, cervical region: Secondary | ICD-10-CM

## 2022-01-26 DIAGNOSIS — M50221 Other cervical disc displacement at C4-C5 level: Secondary | ICD-10-CM | POA: Diagnosis not present

## 2022-01-26 DIAGNOSIS — M5412 Radiculopathy, cervical region: Secondary | ICD-10-CM

## 2022-01-26 DIAGNOSIS — M50222 Other cervical disc displacement at C5-C6 level: Secondary | ICD-10-CM | POA: Diagnosis not present

## 2022-01-26 DIAGNOSIS — M50223 Other cervical disc displacement at C6-C7 level: Secondary | ICD-10-CM | POA: Diagnosis not present

## 2022-01-26 DIAGNOSIS — M5021 Other cervical disc displacement,  high cervical region: Secondary | ICD-10-CM | POA: Diagnosis not present

## 2022-01-26 MED ORDER — GADOBENATE DIMEGLUMINE 529 MG/ML IV SOLN
20.0000 mL | Freq: Once | INTRAVENOUS | Status: AC | PRN
  Administered 2022-01-26: 20 mL via INTRAVENOUS

## 2022-02-06 DIAGNOSIS — M542 Cervicalgia: Secondary | ICD-10-CM | POA: Diagnosis not present

## 2022-02-06 DIAGNOSIS — M797 Fibromyalgia: Secondary | ICD-10-CM | POA: Diagnosis not present

## 2022-02-06 DIAGNOSIS — F32A Depression, unspecified: Secondary | ICD-10-CM | POA: Diagnosis not present

## 2022-02-06 DIAGNOSIS — F419 Anxiety disorder, unspecified: Secondary | ICD-10-CM | POA: Diagnosis not present

## 2022-02-06 DIAGNOSIS — G43709 Chronic migraine without aura, not intractable, without status migrainosus: Secondary | ICD-10-CM | POA: Diagnosis not present

## 2022-02-06 DIAGNOSIS — M545 Low back pain, unspecified: Secondary | ICD-10-CM | POA: Diagnosis not present

## 2022-02-08 ENCOUNTER — Encounter: Payer: Self-pay | Admitting: Specialist

## 2022-02-08 ENCOUNTER — Ambulatory Visit (INDEPENDENT_AMBULATORY_CARE_PROVIDER_SITE_OTHER): Payer: BC Managed Care – PPO | Admitting: Specialist

## 2022-02-08 VITALS — BP 135/80 | HR 65 | Ht 65.0 in | Wt 209.0 lb

## 2022-02-08 DIAGNOSIS — M4722 Other spondylosis with radiculopathy, cervical region: Secondary | ICD-10-CM

## 2022-02-08 DIAGNOSIS — M5412 Radiculopathy, cervical region: Secondary | ICD-10-CM | POA: Diagnosis not present

## 2022-02-08 DIAGNOSIS — M542 Cervicalgia: Secondary | ICD-10-CM

## 2022-02-08 NOTE — Progress Notes (Signed)
? ?Office Visit Note ?  ?Patient: Holly [name]           ?Date of Birth: 07-Nov-1960           ?MRN: [id number] ?Visit Date: 02/08/2022 ?             ?Requested by: Marden Noble, MD ?[address] ?[address] ?[place],  [place] [address] ?PCP: Marden Noble, MD ? ? ?Assessment & Plan: ?Visit Diagnoses:  ?1. Cervicalgia   ?2. Other spondylosis with radiculopathy, cervical region   ?3. Radiculopathy, cervical region   ? ? ?Plan: Avoid overhead lifting and overhead use of the arms. ?Do not lift greater than 5 lbs. ?Adjust head rest in vehicle to prevent hyperextension if rear ended. ?Take extra precautions to avoid falling. ? ?Follow-Up Instructions: Return in about 6 weeks (around 03/22/2022).  ? ?Orders:  ?No orders of the defined types were placed in this encounter. ? ?No orders of the defined types were placed in this encounter. ? ? ? ? Procedures: ?No procedures performed ? ? ?Clinical Data: ?Findings:  ?Narrative & Impression ?CLINICAL DATA:  Left arm pain and cervical stiffness. ?  ?EXAM: ?MRI CERVICAL SPINE WITHOUT AND WITH CONTRASnT ?  ?TECHNIQUE: ?Multiplanar and multiecho pulse sequences of the cervical spine, to ?include the craniocervical junction and cervicothoracic junction, ?were obtained without and with intravenous contrast. ?  ?CONTRAST:  71mL MULTIHANCE GADOBENATE DIMEGLUMINE 529 MG/ML IV SOLN ?  ?COMPARISON:  Radiographs dated November 30, 2021 ?  ?FINDINGS: ?Alignment: Straightening of the cervical spine. ?  ?Vertebrae: No fracture, evidence of discitis, or bone lesion. Small ?hemangioma in the right of the C6 vertebral body. ?  ?Cord: Normal signal and morphology.  Normal enhancement. ?  ?Posterior Fossa, vertebral arteries, paraspinal tissues: Negative. ?  ?Disc levels: ?  ?C2-3: No significant disc bulge. No neural foraminal stenosis. No ?central canal stenosis. ?  ?C3-4: Mild disc protrusion without significant spinal canal or ?neural foraminal stenosis. Mild bilateral facet joint  arthropathy. ?  ?C4-5: Mild disc protrusion. Moderate facet joint arthropathy. Mild ?bilateral neural foraminal stenosis. ?  ?C5-6: Mild disc bulge without significant spinal canal stenosis. ?Bilateral uncovertebral joint arthropathy with mild bilateral neural ?foraminal stenosis. Mild facet joint arthropathy. ?  ?C6-7: Mild disc left paramedian bulge and ligamentum flavum ?hypertrophy. Mild-to-moderate left neural foraminal stenosis. ?Moderate bilateral facet joint arthropathy. ?  ?C7-T1: No significant disc protrusion or spinal canal stenosis. ?Moderate bilateral facet joint arthropathy. ?  ?IMPRESSION: ?1.  No evidence of fracture or subluxation. ?  ?2. Visualized cord and posterior fossa structures are normal in ?signal and morphology. No evidence of abnormal enhancement. ?  ?3. Degenerate disc disease with associated uncovertebral joint and ?facet joint arthropathy. Mild bilateral neural foraminal stenosis as ?C4-C5, C5-C6. Left paramedian disc bulge with mild-to-moderate left ?neural foraminal stenosis. ?  ?  ?Electronically Signed ?  By: Larose Hires D.O. ?  On: 01/26/2022 17:21 ?  ? ? ? ?Subjective: ?Chief Complaint  ?Patient presents with  ? Neck - Follow-up  ? ? ?61 year old right handed female with neck and left arm pain and intermittant numbness and tingling with weakness. She relates it is better. The results of MRI with left C4-5,C5-6 and C6-7 foramenal narrowing that is mild to moderate.  ? ?Review of Systems  ?Constitutional: Negative.   ?HENT: Negative.    ?Eyes: Negative.   ?Respiratory: Negative.    ?Cardiovascular: Negative.   ?Gastrointestinal: Negative.   ?Endocrine: Negative.   ?Genitourinary: Negative.   ?  Musculoskeletal: Negative.   ?Skin: Negative.   ?Allergic/Immunologic: Negative.   ?Neurological: Negative.   ?Hematological: Negative.   ?Psychiatric/Behavioral: Negative.    ? ? ?Objective: ?Vital Signs: BP 135/80 (BP Location: Left Arm, Patient Position: Sitting, Cuff Size: Normal)    Pulse 65   Ht 5\' 5"  (1.651 m)   Wt 209 lb (94.8 kg)   SpO2 97%   BMI 34.78 kg/m?  ? ?Physical Exam ?Constitutional:   ?   Appearance: She is well-developed.  ?HENT:  ?   Head: Normocephalic and atraumatic.  ?Eyes:  ?   Pupils: Pupils are equal, round, and reactive to light.  ?Pulmonary:  ?   Effort: Pulmonary effort is normal.  ?   Breath sounds: Normal breath sounds.  ?Abdominal:  ?   General: Bowel sounds are normal.  ?   Palpations: Abdomen is soft.  ?Musculoskeletal:  ?   Cervical back: Normal range of motion and neck supple.  ?   Lumbar back: Negative right straight leg raise test and negative left straight leg raise test.  ?Skin: ?   General: Skin is warm and dry.  ?Neurological:  ?   Mental Status: She is alert and oriented to person, place, and time.  ?Psychiatric:     ?   Behavior: Behavior normal.     ?   Thought Content: Thought content normal.     ?   Judgment: Judgment normal.  ? ?Back Exam  ? ?Tenderness  ?The patient is experiencing tenderness in the cervical. ? ?Range of Motion  ?Extension:  abnormal  ?Lateral bend left:  abnormal  ?Rotation left:  abnormal  ? ?Tests  ?Straight leg raise right: negative ?Straight leg raise left: negative ? ?Other  ?Toe walk: normal ?Heel walk: normal ? ?Comments:  Decreased extension and left lateral bending and rotation.  ?Motor C7 distribution is normal today ? ? ? ? ?Specialty Comments:  ?No specialty comments available. ? ?Imaging: ?No results found. ? ? ?PMFS History: ?Patient Active Problem List  ? Diagnosis Date Noted  ? Carpal tunnel syndrome, left upper limb 08/11/2019  ?  Priority: High  ?  Class: Chronic  ? Trigger finger, left middle finger 09/05/2015  ?  Priority: High  ?  Class: Chronic  ? Carpal tunnel syndrome, right 09/05/2015  ?  Priority: Low  ?  Class: Chronic  ? DJD (degenerative joint disease), cervical 09/18/2016  ? Other fatigue 09/18/2016  ? Fibromyalgia 09/18/2016  ? Primary osteoarthritis of both knees 09/18/2016  ? Primary insomnia  09/18/2016  ? Vitamin D deficiency 09/18/2016  ? Pulmonary nodules 01/16/2013  ? Dyspnea 01/13/2013  ? ?Past Medical History:  ?Diagnosis Date  ? Anxiety   ? takes Ativan daily  ? Back pain   ? stenosis and buldging disc  ? Bone spur   ? neck and buldging disc  ? Depression   ? takes Cymbalta daily  ? Diabetes ([facility])   ? takes Metformin daily  ? Diverticulosis   ? GERD (gastroesophageal reflux disease)   ? takes Prevacid daily  ? History of bronchitis   ? > 5 yrs ago  ? History of hiatal hernia   ? Hypothyroidism   ? takes Synthroid daily  ? IBS (irritable bowel syndrome)   ? Insomnia   ? doesn't take any meds  ? Joint pain   ? Lung nodule   ? right middle lobe-was being followed by Dr.Burney.Medical Md is keeping up with this  ? Migraine   ?  Migraine   ? last one 08/28/15  ? Nocturia   ? Numbness and tingling in hands   ? PONV (postoperative nausea and vomiting)   ? Sleep apnea   ? Thoracic outlet syndrome   ? TOS (thoracic outlet syndrome)   ? Urinary urgency   ?  ?Family History  ?Problem Relation Age of Onset  ? COPD Mother   ? Cancer Mother   ? Heart disease Paternal Grandmother   ? Heart disease Paternal Grandfather   ? Heart disease Maternal Grandmother   ? Heart disease Maternal Grandfather   ?  ?Past Surgical History:  ?Procedure Laterality Date  ? ABDOMINAL EXPLORATION SURGERY    ? cancer cells in cervix  ? CARPAL TUNNEL RELEASE Right 09/05/2015  ? Procedure: RIGHT OPEN CARPAL TUNNEL RELEASE, RIGHT LONG FINGER TRIGGER RELEASE,RIGHT INDEX FINGER TRIGGER RELEASE;  Surgeon: Kerrin ChampagneJames E Angeldejesus Callaham, MD;  Location: [facility];  Service: Orthopedics;  Laterality: Right;  ? CARPAL TUNNEL RELEASE Left 08/11/2019  ? Procedure: LEFT OPEN CARPAL TUNNEL RELEASE AND LEFT LONG FINGER TRIGGER FINGER RELEASE;  Surgeon: Kerrin ChampagneNitka, Katalea Ucci E, MD;  Location: [facility];  Service: Orthopedics;  Laterality: Left;  ? CHOLECYSTECTOMY    ? COLONOSCOPY    ? ESOPHAGOGASTRODUODENOSCOPY    ? THYROIDECTOMY    ? TONSILLECTOMY    ?  adenoidectomy  ? TRIGGER FINGER RELEASE Right 09/05/2015  ? Procedure: RELEASE TRIGGER FINGER/A-1 PULLEY;  Surgeon: Kerrin ChampagneJames E Myrth Dahan, MD;  Location: [facility];  Service: Orthopedics;  Laterality: Right;  ? TRIGGER FINGER RELEASE Left 11/

## 2022-02-08 NOTE — Patient Instructions (Signed)
Avoid overhead lifting and overhead use of the arms. Do not lift greater than 5 lbs. Adjust head rest in vehicle to prevent hyperextension if rear ended. Take extra precautions to avoid falling.  

## 2022-02-28 IMAGING — CR DG FINGER INDEX 2+V*R*
3 series · 3 of 3 positions shown · non-contrast
Comparison: None.

CLINICAL DATA: Injury to the right index finger while vacuuming
last night. Finger caught in some furniture. Today with pain and
swelling.

EXAM:
RIGHT INDEX FINGER 2+V

[x finger pa right]
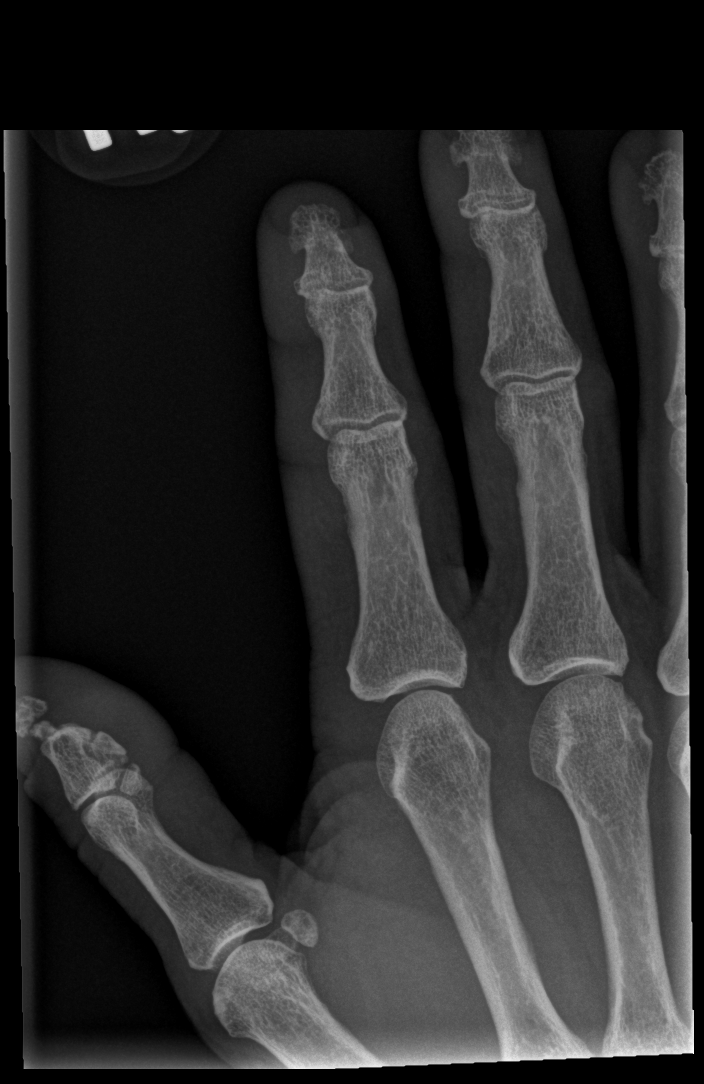

[x finger obl right]
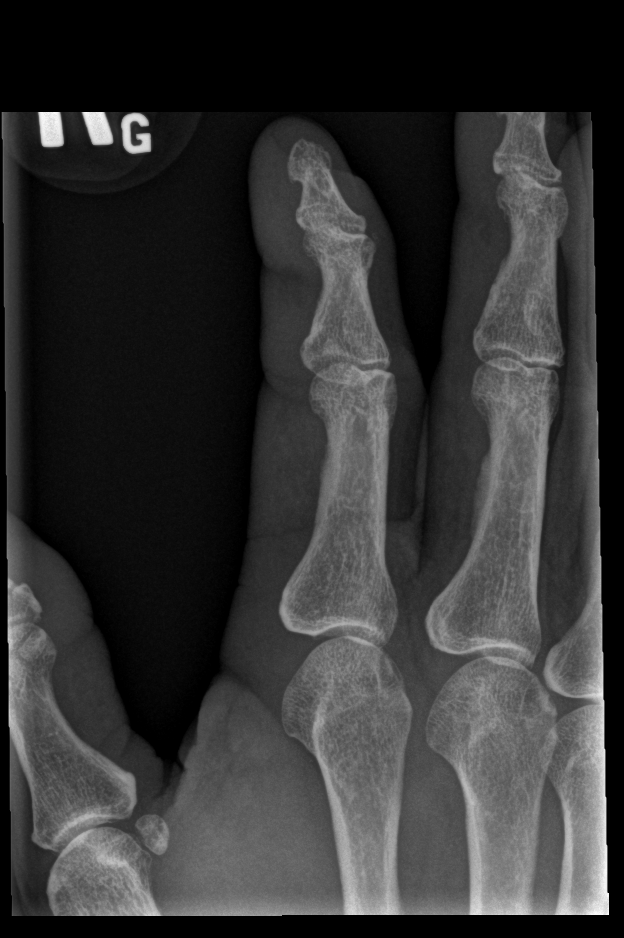

[x finger lat right]
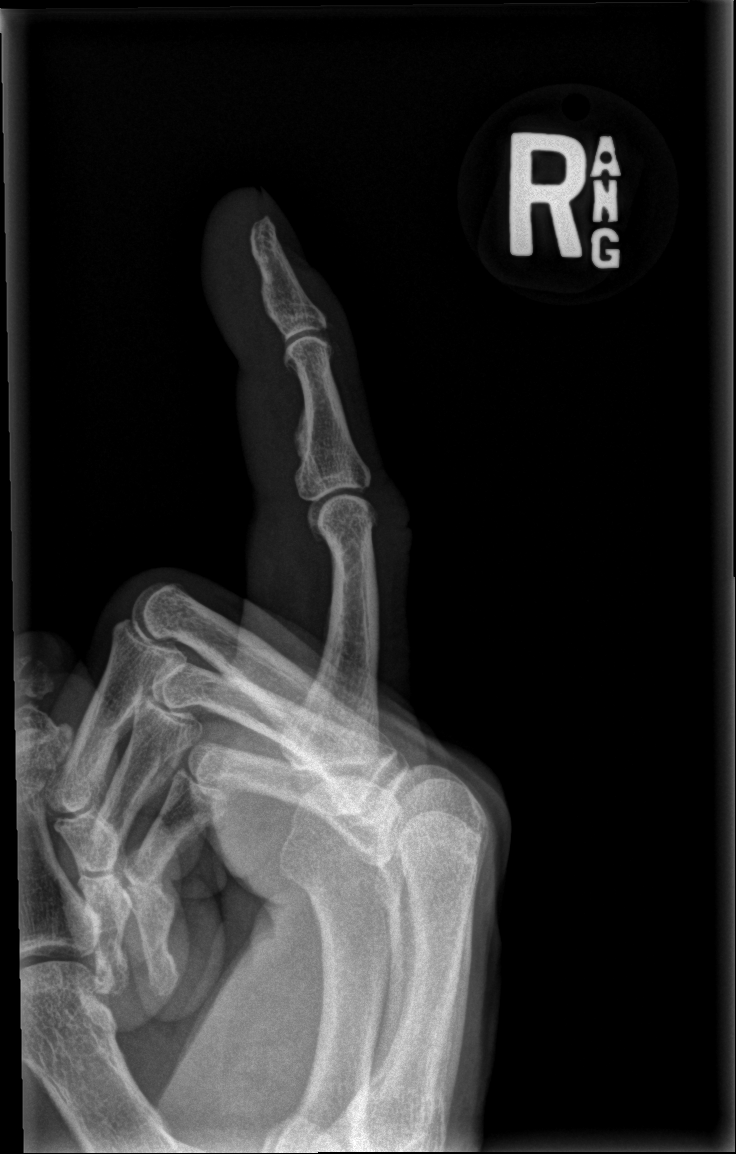

[3 of 3 positions shown; findings below may reference images not displayed]

FINDINGS: No fracture or bone lesion.

Joints normally spaced and aligned.

There is soft tissue swelling of the index finger.
IMPRESSION: 1. No fracture or dislocation.

## 2022-03-22 ENCOUNTER — Ambulatory Visit: Payer: BC Managed Care – PPO | Admitting: Specialist

## 2022-03-27 DIAGNOSIS — E1165 Type 2 diabetes mellitus with hyperglycemia: Secondary | ICD-10-CM | POA: Diagnosis not present

## 2022-03-27 DIAGNOSIS — E039 Hypothyroidism, unspecified: Secondary | ICD-10-CM | POA: Diagnosis not present

## 2022-03-30 DIAGNOSIS — G4733 Obstructive sleep apnea (adult) (pediatric): Secondary | ICD-10-CM | POA: Diagnosis not present

## 2022-04-16 ENCOUNTER — Other Ambulatory Visit: Payer: Self-pay | Admitting: Internal Medicine

## 2022-04-16 ENCOUNTER — Ambulatory Visit
Admission: RE | Admit: 2022-04-16 | Discharge: 2022-04-16 | Disposition: A | Discharge status: Home / Self Care | Payer: BC Managed Care – PPO | Source: Ambulatory Visit | Attending: Internal Medicine | Admitting: Internal Medicine

## 2022-04-16 DIAGNOSIS — R0602 Shortness of breath: Secondary | ICD-10-CM

## 2022-04-30 ENCOUNTER — Ambulatory Visit: Payer: BC Managed Care – PPO | Admitting: Specialist

## 2022-05-15 DIAGNOSIS — M542 Cervicalgia: Secondary | ICD-10-CM | POA: Diagnosis not present

## 2022-05-15 DIAGNOSIS — M545 Low back pain, unspecified: Secondary | ICD-10-CM | POA: Diagnosis not present

## 2022-05-15 DIAGNOSIS — M797 Fibromyalgia: Secondary | ICD-10-CM | POA: Diagnosis not present

## 2022-05-15 DIAGNOSIS — G43719 Chronic migraine without aura, intractable, without status migrainosus: Secondary | ICD-10-CM | POA: Diagnosis not present

## 2022-05-29 DIAGNOSIS — G4733 Obstructive sleep apnea (adult) (pediatric): Secondary | ICD-10-CM | POA: Diagnosis not present

## 2022-06-07 ENCOUNTER — Encounter: Payer: Self-pay | Admitting: Specialist

## 2022-06-07 ENCOUNTER — Ambulatory Visit (INDEPENDENT_AMBULATORY_CARE_PROVIDER_SITE_OTHER): Payer: BC Managed Care – PPO | Admitting: Specialist

## 2022-06-07 VITALS — BP 123/75 | HR 56 | Ht 65.0 in | Wt 208.5 lb

## 2022-06-07 DIAGNOSIS — M5116 Intervertebral disc disorders with radiculopathy, lumbar region: Secondary | ICD-10-CM

## 2022-06-07 DIAGNOSIS — M542 Cervicalgia: Secondary | ICD-10-CM

## 2022-06-07 DIAGNOSIS — M5412 Radiculopathy, cervical region: Secondary | ICD-10-CM

## 2022-06-07 DIAGNOSIS — M25512 Pain in left shoulder: Secondary | ICD-10-CM

## 2022-06-07 DIAGNOSIS — M4722 Other spondylosis with radiculopathy, cervical region: Secondary | ICD-10-CM | POA: Diagnosis not present

## 2022-06-07 DIAGNOSIS — M778 Other enthesopathies, not elsewhere classified: Secondary | ICD-10-CM

## 2022-06-07 DIAGNOSIS — R2231 Localized swelling, mass and lump, right upper limb: Secondary | ICD-10-CM

## 2022-06-07 MED ORDER — DICLOFENAC SODIUM 1 % EX GEL: 4.0000 g | Freq: Four times a day (QID) | CUTANEOUS | 3 refills | Status: DC

## 2022-06-07 NOTE — Progress Notes (Signed)
Office Visit Note   Patient: Holly [name]           Date of Birth: December 10, 1960           MRN: [id number] Visit Date: 06/07/2022              Requested by: Marden Noble, MD [address] [place],  [place] [address] PCP: Emilio Aspen, MD   Assessment & Plan: Visit Diagnoses:  1. Radiculopathy, cervical region   2. Other spondylosis with radiculopathy, cervical region   3. Cervicalgia   4. Acute pain of left shoulder   5. Radiculopathy due to lumbar intervertebral disc disorder     Plan: Avoid overhead lifting and overhead use of the arms. Do not lift greater than 5 lbs. Adjust head rest in vehicle to prevent hyperextension if rear ended. Take extra precautions to avoid falling.  If you see drawing up of the left hand fingers then you will need referral to a hand specialist for assessment for Dupuytrens or palmar fibromatosis. Try to avoid extension of the neck as this tends to worsen syptoms of cervical spondylosis.  Follow-Up Instructions: No follow-ups on file.   Orders:  No orders of the defined types were placed in this encounter.  No orders of the defined types were placed in this encounter.     Procedures: No procedures performed   Clinical Data: No additional findings.   Subjective: Chief Complaint  Patient presents with   Neck - Follow-up   Left Shoulder - Pain    61 year old female with history of left hand trigger release and carpal tunnel syndrome. She feels better over all.     Review of Systems   Objective: Vital Signs: BP 123/75 (BP Location: Left Arm, Patient Position: Sitting)   Pulse (!) 56   Ht 5\' 5"  (1.651 m)   Wt 208 lb 8 oz (94.6 kg)   BMI 34.70 kg/m   Physical Exam  Ortho Exam  Specialty Comments:  No specialty comments available.  Imaging: No results found.   PMFS History: Patient Active Problem List   Diagnosis Date Noted   Carpal tunnel syndrome, left upper limb 08/11/2019     Priority: High    Class: Chronic   Trigger finger, left middle finger 09/05/2015    Priority: High    Class: Chronic   Carpal tunnel syndrome, right 09/05/2015    Priority: Low    Class: Chronic   DJD (degenerative joint disease), cervical 09/18/2016   Other fatigue 09/18/2016   Fibromyalgia 09/18/2016   Primary osteoarthritis of both knees 09/18/2016   Primary insomnia 09/18/2016   Vitamin D deficiency 09/18/2016   Pulmonary nodules 01/16/2013   Dyspnea 01/13/2013   Past Medical History:  Diagnosis Date   Anxiety    takes Ativan daily   Back pain    stenosis and buldging disc   Bone spur    neck and buldging disc   Depression    takes Cymbalta daily   Diabetes (HCC)    takes Metformin daily   Diverticulosis    GERD (gastroesophageal reflux disease)    takes Prevacid daily   History of bronchitis    > 5 yrs ago   History of hiatal hernia    Hypothyroidism    takes Synthroid daily   IBS (irritable bowel syndrome)    Insomnia    doesn't take any meds   Joint pain    Lung nodule    right  middle lobe-was being followed by Dr.Burney.Medical Md is keeping up with this   Migraine    Migraine    last one 08/28/15   Nocturia    Numbness and tingling in hands    PONV (postoperative nausea and vomiting)    Sleep apnea    Thoracic outlet syndrome    TOS (thoracic outlet syndrome)    Urinary urgency     Family History  Problem Relation Age of Onset   COPD Mother    Cancer Mother    Heart disease Paternal Grandmother    Heart disease Paternal Grandfather    Heart disease Maternal Grandmother    Heart disease Maternal Grandfather     Past Surgical History:  Procedure Laterality Date   ABDOMINAL EXPLORATION SURGERY     cancer cells in cervix   CARPAL TUNNEL RELEASE Right 09/05/2015   Procedure: RIGHT OPEN CARPAL TUNNEL RELEASE, RIGHT LONG FINGER TRIGGER RELEASE,RIGHT INDEX FINGER TRIGGER RELEASE;  Surgeon: Kerrin Champagne, MD;  Location: [facility] OR;  Service: Orthopedics;   Laterality: Right;   CARPAL TUNNEL RELEASE Left 08/11/2019   Procedure: LEFT OPEN CARPAL TUNNEL RELEASE AND LEFT LONG FINGER TRIGGER FINGER RELEASE;  Surgeon: Kerrin Champagne, MD;  Location: [facility];  Service: Orthopedics;  Laterality: Left;   CHOLECYSTECTOMY     COLONOSCOPY     ESOPHAGOGASTRODUODENOSCOPY     THYROIDECTOMY     TONSILLECTOMY     adenoidectomy   TRIGGER FINGER RELEASE Right 09/05/2015   Procedure: RELEASE TRIGGER FINGER/A-1 PULLEY;  Surgeon: Kerrin Champagne, MD;  Location: [facility] OR;  Service: Orthopedics;  Laterality: Right;   TRIGGER FINGER RELEASE Left 08/11/2019   Procedure: RELEASE TRIGGER FINGER/A-1 PULLEY;  Surgeon: Kerrin Champagne, MD;  Location: [facility];  Service: Orthopedics;  Laterality: Left;   Social History   Occupational History   Not on file  Tobacco Use   Smoking status: Former    Types: Cigarettes   Smokeless tobacco: Never  Substance and Sexual Activity   Alcohol use: No   Drug use: No   Sexual activity: Yes    Birth control/protection: Post-menopausal

## 2022-06-07 NOTE — Patient Instructions (Signed)
Plan: Avoid overhead lifting and overhead use of the arms. Do not lift greater than 5 lbs. Adjust head rest in vehicle to prevent hyperextension if rear ended. Take extra precautions to avoid falling.  If you see drawing up of the left hand fingers then you will need referral to a hand specialist for assessment for Dupuytrens or palmar fibromatosis. Try to avoid extension of the neck as this tends to worsen syptoms of cervical spondylosis.  Follow-Up Instructions: No follow-ups on file.

## 2022-06-07 NOTE — Addendum Note (Signed)
Addended by: Vira Browns on: 06/07/2022 04:04 PM   Modules accepted: Orders

## 2022-07-03 ENCOUNTER — Ambulatory Visit: Payer: BC Managed Care – PPO | Admitting: Specialist

## 2022-07-12 ENCOUNTER — Ambulatory Visit: Payer: BC Managed Care – PPO | Admitting: Orthopaedic Surgery

## 2022-07-13 ENCOUNTER — Encounter: Payer: Self-pay | Admitting: Cardiovascular Disease

## 2022-07-13 ENCOUNTER — Ambulatory Visit: Payer: BC Managed Care – PPO | Attending: Cardiovascular Disease | Admitting: Cardiovascular Disease

## 2022-07-13 VITALS — BP 146/80 | HR 80 | Ht 65.0 in | Wt 205.2 lb

## 2022-07-13 DIAGNOSIS — I480 Paroxysmal atrial fibrillation: Secondary | ICD-10-CM

## 2022-07-13 DIAGNOSIS — I200 Unstable angina: Secondary | ICD-10-CM

## 2022-07-13 DIAGNOSIS — R9431 Abnormal electrocardiogram [ECG] [EKG]: Secondary | ICD-10-CM | POA: Diagnosis not present

## 2022-07-13 MED ORDER — APIXABAN 5 MG PO TABS: 5.0000 mg | ORAL_TABLET | Freq: Two times a day (BID) | ORAL | 11 refills | Status: DC

## 2022-07-13 MED ORDER — METOPROLOL TARTRATE 100 MG PO TABS: ORAL_TABLET | ORAL | 0 refills | Status: DC

## 2022-07-13 NOTE — Patient Instructions (Addendum)
Medication Instructions:  Your physician recommends that you continue on your current medications as directed. Please refer to the Current Medication list given to you today.  *If you need a refill on your cardiac medications before your next appointment, please call your pharmacy*   Lab Work: BMET Today If you have labs (blood work) drawn today and your tests are completely normal, you will receive your results only by: MyChart Message (if you have MyChart) OR A paper copy in the mail If you have any lab test that is abnormal or we need to change your treatment, we will call you to review the results.   Testing/Procedures: 30-day cardiac monitor Your physician has recommended that you wear an event monitor. Event monitors are medical devices that record the heart's electrical activity. Doctors most often [place] these monitors to diagnose arrhythmias. Arrhythmias are problems with the speed or rhythm of the heartbeat. The monitor is a small, portable device. You can wear one while you do your normal daily activities. This is usually used to diagnose what is causing palpitations/syncope (passing out).  Coronary CT Angiogram (in 30 days) Your physician has requested that you have cardiac CT. Cardiac computed tomography (CT) is a painless test that uses an x-ray machine to take clear, detailed pictures of your heart. For further information please visit [email or web address] Please follow instruction sheet as given.  ECHO Your physician has requested that you have an echocardiogram. Echocardiography is a painless test that uses sound waves to create images of your heart. It provides your doctor with information about the size and shape of your heart and how well your heart's chambers and valves are working. This procedure takes approximately one hour. There are no restrictions for this procedure. Please do NOT wear cologne, perfume, aftershave, or lotions (deodorant is allowed). Please arrive 15  minutes prior to your appointment time.  Follow-Up: At [facility], you and your health needs are our priority.  As part of our continuing mission to provide you with exceptional heart care, we have created designated Provider Care Teams.  These Care Teams include your primary Cardiologist (physician) and Advanced Practice Providers (APPs -  Physician Assistants and Nurse Practitioners) who all work together to provide you with the care you need, when you need it.  We recommend signing up for the patient portal called "MyChart".  Sign up information is provided on this After Visit Summary.  MyChart is used to connect with patients for Virtual Visits (Telemedicine).  Patients are able to view lab/test results, encounter notes, upcoming appointments, etc.  Non-urgent messages can be sent to your provider as well.   To learn more about what you can do with MyChart, go to [email or web address].    Your next appointment:   3 month(s)  The format for your next appointment:   In Person  Provider:   Kristeen Miss, MD  Other Instructions   Your cardiac CT will be scheduled at one of the below locations:   [facility] [address] [place], [place] [address] [phone]  Please arrive at the [facility] and Children's Entrance (Entrance C2) of [facility] 30 minutes prior to test start time. You can use the FREE valet parking offered at entrance C (encouraged to control the heart rate for the test)  Proceed to the [facility] Radiology Department (first floor) to check-in and test prep.  All radiology patients and guests should use entrance C2 at [facility], accessed from [facility], even  though the hospital's physical address listed is [address]    Please follow these instructions carefully (unless otherwise directed):  On the Night Before the Test: Be sure to Drink plenty of water. Do not consume any  caffeinated/decaffeinated beverages or chocolate 12 hours prior to your test. Do not take any antihistamines 12 hours prior to your test.  On the Day of the Test: Drink plenty of water until 1 hour prior to the test. Do not eat any food 1 hour prior to test. You may take your regular medications prior to the test.  Take metoprolol (Lopressor) two hours prior to test. FEMALES- please wear underwire-free bra if available, avoid dresses & tight clothing  After the Test: Drink plenty of water. After receiving IV contrast, you may experience a mild flushed feeling. This is normal. On occasion, you may experience a mild rash up to 24 hours after the test. This is not dangerous. If this occurs, you can take Benadryl 25 mg and increase your fluid intake. If you experience trouble breathing, this can be serious. If it is severe call 911 IMMEDIATELY. If it is mild, please call our office. If you take any of these medications: Glipizide/Metformin, Avandament, Glucavance, please do not take 48 hours after completing test unless otherwise instructed.  We will call to schedule your test 2-4 weeks out understanding that some insurance companies will need an authorization prior to the service being performed.   For non-scheduling related questions, please contact the cardiac imaging nurse navigator should you have any questions/concerns: Marchia Bond, Cardiac Imaging Nurse Navigator Gordy Clement, Cardiac Imaging Nurse Navigator [facility] Direct Office Dial: [phone]   For scheduling needs, including cancellations and rescheduling, please call [place], [phone].   Important Information About Sugar

## 2022-07-13 NOTE — Progress Notes (Signed)
Cardiology Office Note:    Date:  07/13/2022   ID:  Holly [name], DOB 12-Oct-1960, MRN [id number]  PCP:  Emilio Aspen, MD   [facility] Providers Cardiologist:  Jaylea Plourde     Referring MD: Emilio Aspen, *   Chief Complaint  Patient presents with   Shortness of Breath          History of Present Illness:    Holly [name] is a 61 y.o. female with a hx of shortness of breath, ,DM, obesity , hypothyroidism . She recently has had some arrhythmias and a Kardia mobile showed some atrial fib   She recorded an eipsode of PAF detected by her Lourena Simmonds monitor on Feb 11, 2022 at 10:13 PM Occurred a month after she had lowered her levothyroxine  CHADS2VASC is 2 ( female, DM)  I recommended that we start Eliquis.  She told me that she has hemorrhoids that bleed frequently and is scared that she will have excessive bleeding.  Of asked her to see a surgeon to see if there is any alternative treatment  Has OSA , wears her CPAP consistently .   Gets good readings on her CPAP feedback .  Does not get exercise  Has been getting winded with any exercise    Yesterday was playing volleyball,  Was sweating and had severe DOE  Has chest pain with exertion  Might last a minute or so  Radiates through to intrascapular region  Associated with dyspnea  Is frequently fatigued   + family hx of CAD Father had carotid art disease in 24s,  then Afib in 25s Maternal grandmother had CAD  Both grandfathers   Has been hyperthyoidism Synthroid was reduced  6 months ago from .112 to 0.100 mg   8 days ago her TSH was 0.110  . Dr. Orson Aloe wanted to lower her synthroid to 0.088 mg  I've agreed with dr. Orson Aloe that she should lower her dose   She needs to see endocrinology - Dr. Talmage Nap is who she saw previously    Past Medical History:  Diagnosis Date   Anxiety    takes Ativan daily   Back pain    stenosis and buldging disc   Bone spur     neck and buldging disc   Depression    takes Cymbalta daily   Diabetes (HCC)    takes Metformin daily   Diverticulosis    GERD (gastroesophageal reflux disease)    takes Prevacid daily   History of bronchitis    > 5 yrs ago   History of hiatal hernia    Hypothyroidism    takes Synthroid daily   IBS (irritable bowel syndrome)    Insomnia    doesn't take any meds   Joint pain    Lung nodule    right middle lobe-was being followed by Dr.Burney.Medical Md is keeping up with this   Migraine    Migraine    last one 08/28/15   Nocturia    Numbness and tingling in hands    PONV (postoperative nausea and vomiting)    Sleep apnea    Thoracic outlet syndrome    TOS (thoracic outlet syndrome)    Urinary urgency     Past Surgical History:  Procedure Laterality Date   ABDOMINAL EXPLORATION SURGERY     cancer cells in cervix   CARPAL TUNNEL RELEASE Right 09/05/2015   Procedure: RIGHT OPEN CARPAL TUNNEL RELEASE, RIGHT LONG FINGER TRIGGER RELEASE,RIGHT INDEX FINGER TRIGGER  RELEASE;  Surgeon: Kerrin ChampagneJames E Nitka, MD;  Location: [facility] OR;  Service: Orthopedics;  Laterality: Right;   CARPAL TUNNEL RELEASE Left 08/11/2019   Procedure: LEFT OPEN CARPAL TUNNEL RELEASE AND LEFT LONG FINGER TRIGGER FINGER RELEASE;  Surgeon: Kerrin ChampagneNitka, James E, MD;  Location: [facility];  Service: Orthopedics;  Laterality: Left;   CHOLECYSTECTOMY     COLONOSCOPY     ESOPHAGOGASTRODUODENOSCOPY     THYROIDECTOMY     TONSILLECTOMY     adenoidectomy   TRIGGER FINGER RELEASE Right 09/05/2015   Procedure: RELEASE TRIGGER FINGER/A-1 PULLEY;  Surgeon: Kerrin ChampagneJames E Nitka, MD;  Location: [facility] OR;  Service: Orthopedics;  Laterality: Right;   TRIGGER FINGER RELEASE Left 08/11/2019   Procedure: RELEASE TRIGGER FINGER/A-1 PULLEY;  Surgeon: Kerrin ChampagneNitka, James E, MD;  Location:  SURGERY CENTER;  Service: Orthopedics;  Laterality: Left;    Current Medications: Current Meds  Medication Sig   acetaminophen (TYLENOL) 500 MG  tablet Take 500 mg by mouth every 6 (six) hours as needed.   amitriptyline (ELAVIL) 10 MG tablet TAKE 1 TABLET BY MOUTH EVERYDAY AT BEDTIME   apixaban (ELIQUIS) 5 MG TABS tablet Take 1 tablet (5 mg total) by mouth 2 (two) times daily.   B Complex-Folic Acid (B COMPLEX FORMULA 1) TABS Take by mouth.   Buprenorphine HCl-Naloxone HCl 4-1 MG FILM Place 2 Film under the tongue 2 (two) times daily.   clobetasol ointment (TEMOVATE) 0.05 % clobetasol 0.05 % topical ointment   conjugated estrogens (PREMARIN) 0.625 MG/GM vaginal cream Place vaginally.   diclofenac Sodium (VOLTAREN) 1 % GEL APPLY 2 GRAMS TO AFFECTED AREA 4 TIMES A DAY   diclofenac Sodium (VOLTAREN) 1 % GEL Apply 4 g topically 4 (four) times daily.   DiphenhydrAMINE HCl (BENADRYL ALLERGY PO) Take by mouth as needed. Taken with advil for migraine management.   famotidine (PEPCID) 20 MG tablet Take 20 mg by mouth 2 (two) times daily.   glucose blood test strip    ibuprofen (ADVIL) 800 MG tablet TAKE 1 TABLET (800 MG TOTAL) BY MOUTH 2 (TWO) TIMES DAILY AFTER A MEAL.   levothyroxine (SYNTHROID) 100 MCG tablet Take 100 mcg by mouth daily.   LORazepam (ATIVAN) 2 MG tablet Take 2 mg by mouth 4 (four) times daily.   metFORMIN (GLUCOPHAGE) 500 MG tablet Take 1,000 mg by mouth 2 (two) times daily with a meal.    metoprolol tartrate (LOPRESSOR) 100 MG tablet Take 1 tablet by mouth two hours prior to scan   OnabotulinumtoxinA (BOTOX IJ) Inject as directed every 3 (three) months. For migraine prevention.   VITAMIN D, ERGOCALCIFEROL, PO Take 2,000 mg by mouth daily.   [DISCONTINUED] SUBOXONE 8-2 MG FILM Place 3 Film under the tongue daily.      Allergies:   Cefuroxime axetil, Effexor xr [venlafaxine hcl er], Sumatriptan, Erenumab-aooe, Garlic, Onion, Statins, Crestor [rosuvastatin calcium], and Triptans   Social History   Socioeconomic History   Marital status: Married    Spouse name: Not on file   Number of children: Not on file   Years of  education: Not on file   Highest education level: Not on file  Occupational History   Not on file  Tobacco Use   Smoking status: Former    Types: Cigarettes   Smokeless tobacco: Never  Substance and Sexual Activity   Alcohol use: No   Drug use: No   Sexual activity: Yes    Birth control/protection: Post-menopausal  Other Topics Concern   Not  on file  Social History Narrative   Not on file   Social Determinants of Health   Financial Resource Strain: Not on file  Food Insecurity: Not on file  Transportation Needs: Not on file  Physical Activity: Not on file  Stress: Not on file  Social Connections: Not on file     Family History: The patient's family history includes COPD in her mother; Cancer in her mother; Heart disease in her maternal grandfather, maternal grandmother, paternal grandfather, and paternal grandmother.  ROS:   Please see the history of present illness.     All other systems reviewed and are negative.  EKGs/Labs/Other Studies Reviewed:    The following studies were reviewed today:   EKG: Oct. 13, 2023 .  NSR at 80.  Normal ecg   Recent Labs: No results found for requested labs within last 365 days.  Recent Lipid Panel    Component Value Date/Time   CHOL  05/29/2007 0505    197        ATP III CLASSIFICATION:  <200     mg/dL   Desirable  710-626  mg/dL   Borderline High  >=948    mg/dL   High   TRIG 546 (H) 27/11/5007 0505   HDL 28 (L) 05/29/2007 0505   CHOLHDL 7.0 05/29/2007 0505   VLDL 79 (H) 05/29/2007 0505   LDLCALC  05/29/2007 0505    90        Total Cholesterol/HDL:CHD Risk Coronary Heart Disease Risk Table                     Men   Women  1/2 Average Risk   3.4   3.3     Risk Assessment/Calculations:    CHA2DS2-VASc Score = 2   This indicates a 2.2% annual risk of stroke. The patient's score is based upon: CHF History: 0 HTN History: 0 Diabetes History: 1 Stroke History: 0 Vascular Disease History: 0 Age Score: 0 Gender  Score: 1             Physical Exam:    VS:  BP (!) 146/80   Pulse 80   Ht 5\' 5"  (1.651 m)   Wt 205 lb 3.2 oz (93.1 kg)   SpO2 97%   BMI 34.15 kg/m     Wt Readings from Last 3 Encounters:  07/13/22 205 lb 3.2 oz (93.1 kg)  06/07/22 208 lb 8 oz (94.6 kg)  02/08/22 209 lb (94.8 kg)     GEN:  Well nourished, well developed in no acute distress HEENT: Normal NECK: No JVD; No carotid bruits LYMPHATICS: No lymphadenopathy CARDIAC: RRR, no murmurs, rubs, gallops RESPIRATORY:  Clear to auscultation without rales, wheezing or rhonchi  ABDOMEN: Soft, non-tender, non-distended MUSCULOSKELETAL:  No edema; No deformity  SKIN: Warm and dry NEUROLOGIC:  Alert and oriented x 3 PSYCHIATRIC:  Normal affect   ASSESSMENT:    1. Abnormal electrocardiogram   2. PAF (paroxysmal atrial fibrillation) (HCC)   3. Unstable angina (HCC)    PLAN:    In order of problems listed above:  Paroxysmal atrial fibrillation: Holly [name] presents for an episode of paroxysmal atrial fibrillation fibrillation that was caught on the cardia mobile monitor. I like to verify this with the 30-day event monitor.  She has hypothyroidism and this is quite likely the cause.  At present her CHA2DS2-VASc score is 2   We discussed the need for eliquis She has severe hemorrhoids and bleeds frequently  She is  very nervous about starting the equilis not Ive asked her to contact [place] [facility] and get an opinion  Would like a 30 day event monitor to assess her afib burden  2.  Unstable angina ; symptoms are consistent with possible unstable angina.  She is having chest discomfort which radiates through to her shoulder blades.  This also could be due to her hyperthyroidism.  We will order a coronary CT angiogram for further evaluation.  Would like to have her hyperthyroidims controlled prior to the contract study .            Medication Adjustments/Labs and Tests Ordered: Current medicines are reviewed at  length with the patient today.  Concerns regarding medicines are outlined above.  Orders Placed This Encounter  Procedures   CT CORONARY MORPH W/CTA COR W/SCORE W/CA W/CM &/OR WO/CM   Basic metabolic panel   CARDIAC EVENT MONITOR   EKG 12-Lead   ECHOCARDIOGRAM COMPLETE   Meds ordered this encounter  Medications   metoprolol tartrate (LOPRESSOR) 100 MG tablet    Sig: Take 1 tablet by mouth two hours prior to scan    Dispense:  1 tablet    Refill:  0   apixaban (ELIQUIS) 5 MG TABS tablet    Sig: Take 1 tablet (5 mg total) by mouth 2 (two) times daily.    Dispense:  60 tablet    Refill:  11    Patient Instructions  Medication Instructions:  Your physician recommends that you continue on your current medications as directed. Please refer to the Current Medication list given to you today.  *If you need a refill on your cardiac medications before your next appointment, please call your pharmacy*   Lab Work: BMET Today If you have labs (blood work) drawn today and your tests are completely normal, you will receive your results only by: MyChart Message (if you have MyChart) OR A paper copy in the mail If you have any lab test that is abnormal or we need to change your treatment, we will call you to review the results.   Testing/Procedures: 30-day cardiac monitor Your physician has recommended that you wear an event monitor. Event monitors are medical devices that record the heart's electrical activity. Doctors most often [place] these monitors to diagnose arrhythmias. Arrhythmias are problems with the speed or rhythm of the heartbeat. The monitor is a small, portable device. You can wear one while you do your normal daily activities. This is usually used to diagnose what is causing palpitations/syncope (passing out).  Coronary CT Angiogram (in 30 days) Your physician has requested that you have cardiac CT. Cardiac computed tomography (CT) is a painless test that uses an x-ray machine to  take clear, detailed pictures of your heart. For further information please visit [email or web address] Please follow instruction sheet as given.  ECHO Your physician has requested that you have an echocardiogram. Echocardiography is a painless test that uses sound waves to create images of your heart. It provides your doctor with information about the size and shape of your heart and how well your heart's chambers and valves are working. This procedure takes approximately one hour. There are no restrictions for this procedure. Please do NOT wear cologne, perfume, aftershave, or lotions (deodorant is allowed). Please arrive 15 minutes prior to your appointment time.  Follow-Up: At [facility], you and your health needs are our priority.  As part of our continuing mission to provide you with exceptional heart care, we have created designated  Provider Care Teams.  These Care Teams include your primary Cardiologist (physician) and Advanced Practice Providers (APPs -  Physician Assistants and Nurse Practitioners) who all work together to provide you with the care you need, when you need it.  We recommend signing up for the patient portal called "MyChart".  Sign up information is provided on this After Visit Summary.  MyChart is used to connect with patients for Virtual Visits (Telemedicine).  Patients are able to view lab/test results, encounter notes, upcoming appointments, etc.  Non-urgent messages can be sent to your provider as well.   To learn more about what you can do with MyChart, go to [email or web address].    Your next appointment:   3 month(s)  The format for your next appointment:   In Person  Provider:   Mertie Moores, MD  Other Instructions   Your cardiac CT will be scheduled at one of the below locations:   [facility] [address] [place], [place] [address] [phone]  Please arrive at the [facility] and Children's Entrance (Entrance C2) of [facility] 30 minutes prior to test start time. You can use the FREE valet parking offered at entrance C (encouraged to control the heart rate for the test)  Proceed to the [facility] Radiology Department (first floor) to check-in and test prep.  All radiology patients and guests should use entrance C2 at [facility], accessed from [facility], even though the hospital's physical address listed is [address]    Please follow these instructions carefully (unless otherwise directed):  On the Night Before the Test: Be sure to Drink plenty of water. Do not consume any caffeinated/decaffeinated beverages or chocolate 12 hours prior to your test. Do not take any antihistamines 12 hours prior to your test.  On the Day of the Test: Drink plenty of water until 1 hour prior to the test. Do not eat any food 1 hour prior to test. You may take your regular medications prior to the test.  Take metoprolol (Lopressor) two hours prior to test. FEMALES- please wear underwire-free bra if available, avoid dresses & tight clothing  After the Test: Drink plenty of water. After receiving IV contrast, you may experience a mild flushed feeling. This is normal. On occasion, you may experience a mild rash up to 24 hours after the test. This is not dangerous. If this occurs, you can take Benadryl 25 mg and increase your fluid intake. If you experience trouble breathing, this can be serious. If it is severe call 911 IMMEDIATELY. If it is mild, please call our office. If you take any of these medications: Glipizide/Metformin, Avandament, Glucavance, please do not take 48 hours after completing test unless otherwise instructed.  We will call to schedule your test 2-4 weeks out understanding that some insurance companies will need an authorization prior to the service being performed.   For non-scheduling related questions, please contact the cardiac imaging nurse navigator should  you have any questions/concerns: Marchia Bond, Cardiac Imaging Nurse Navigator Gordy Clement, Cardiac Imaging Nurse Navigator  Heart and Vascular Services Direct Office Dial: [phone]   For scheduling needs, including cancellations and rescheduling, please call [place], [phone].   Important Information About Sugar         Signed, Mertie Moores, MD  07/13/2022 5:29 PM    [place]

## 2022-07-14 LAB — BASIC METABOLIC PANEL
BUN/Creatinine Ratio: 10 (ref 9–23)
BUN: 9 mg/dL (ref 6–24)
CO2: 27 mmol/L (ref 20–29)
Calcium: 9.9 mg/dL (ref 8.7–10.2)
Chloride: 98 mmol/L (ref 96–106)
Creatinine, Ser: 0.87 mg/dL (ref 0.57–1.00)
Glucose: 243 mg/dL — ABNORMAL HIGH (ref 70–99)
Potassium: 4.6 mmol/L (ref 3.5–5.2)
Sodium: 137 mmol/L (ref 134–144)
eGFR: 77 mL/min/{1.73_m2} (ref >59)

## 2022-07-16 ENCOUNTER — Telehealth: Payer: Self-pay | Admitting: Cardiovascular Disease

## 2022-07-16 ENCOUNTER — Encounter: Payer: Self-pay | Admitting: *Deleted

## 2022-07-16 NOTE — Telephone Encounter (Signed)
New Message:     Patient said she saw Dr Acie Fredrickson on Friday. She said she would like for his nurse to call her today asap please. She said she will need a referral. She said if Dr Acie Fredrickson calls, she might can get in sooner.

## 2022-07-16 NOTE — Telephone Encounter (Signed)
See other phone encounter from today.

## 2022-07-16 NOTE — Telephone Encounter (Signed)
Returned call to patient to let her know that I will send message to [facility] to arrange putting the monitor on the date requested (Thursday 07/26/22). Also let pt know that Dr Acie Fredrickson called Dr Almetta Lovely office to speak with her, but was unable to get her as she had already left. Nahser will try to reach her (MD) again tomorrow. Patient states PCP has already placed the referral to Balan's office, but she can't move forward with CT scan until being seen. She understands to decrease levothyroxine to 39mcg as directed.

## 2022-07-16 NOTE — Progress Notes (Signed)
Patient ID: Holly [name], female   DOB: 1961/02/02, 61 y.o.   MRN: [id number] Patient enrolled for [employer] to ship a 30 day cardiac event monitor to her address on file.  Letter with instructions mailed to patient.

## 2022-07-16 NOTE — Telephone Encounter (Signed)
Patient is calling wanting to setup an appt to have her heart monitor placed on 10/26 when she comes in for her echo. Patient is also requesting a callback from Judson Roch to discuss Dr. Acie Fredrickson sending a referral for her to see the endocrinologist as well as calling them requesting she be seen in a timely manner. Please advise.

## 2022-07-17 ENCOUNTER — Telehealth (INDEPENDENT_AMBULATORY_CARE_PROVIDER_SITE_OTHER): Payer: Self-pay | Admitting: Cardiovascular Disease

## 2022-07-17 ENCOUNTER — Ambulatory Visit: Payer: BC Managed Care – PPO | Admitting: Orthopaedic Surgery

## 2022-07-17 DIAGNOSIS — E039 Hypothyroidism, unspecified: Secondary | ICD-10-CM

## 2022-07-17 NOTE — Telephone Encounter (Signed)
Pt with hyperthyroidsim ( while on synthroid replacement therapy) She has symptoms concerning for unstable angina and would benefit from getting a coronary CTA   I have discussed the case with Dr. Chalmers Cater and she assures me that the risk of causing worsening hyperthyroidism with the IV contrast study if very low  We can proceed with the coronary CTA as planned.     Mertie Moores, MD  07/17/2022 12:54 PM    [place],  [place] [employer] [place] [employer], [place]  [address] Phone: [phone]; Fax: [phone]

## 2022-07-18 NOTE — Telephone Encounter (Signed)
Nahser, Holly Cheng, MD  Donnalee Curry K Caller: Unspecified (2 days ago,  9:26 AM) Pt with hyperthyroidsim ( while on synthroid replacement therapy)  She has symptoms concerning for unstable angina and would benefit from getting a coronary CTA     I have discussed the case with Dr. Chalmers Cater and she assures me that the risk of causing worsening hyperthyroidism with the IV contrast study if very low     We can proceed with the coronary CTA as planned.  Mertie Moores, MD  07/17/2022 12:54 PM   Called and spoke with patient and let her know the above from Dr Acie Fredrickson. She has scheduled the CTA for 07/25/22 and had no further questions.

## 2022-07-24 ENCOUNTER — Telehealth (HOSPITAL_COMMUNITY): Payer: Self-pay | Admitting: Emergency Medicine

## 2022-07-24 NOTE — Telephone Encounter (Signed)
Attempted to call patient regarding upcoming cardiac CT appointment. °Left message on voicemail with name and callback number °Ishaq Maffei RN Navigator Cardiac Imaging ° Heart and Vascular Services °[phone] Office °[phone] Cell ° °

## 2022-07-24 NOTE — Telephone Encounter (Signed)
Reaching out to patient to offer assistance regarding upcoming cardiac imaging study; pt verbalizes understanding of appt date/time, parking situation and where to check in, pre-test NPO status and medications ordered, and verified current allergies; name and call back number provided for further questions should they arise Marchia Bond RN Navigator Cardiac Imaging [facility] [phone] office [phone] cell  Arrival 130 WC entrance 50mg  metoprolol (100mg  metoprolol order) Denies iv issues

## 2022-07-25 ENCOUNTER — Ambulatory Visit (HOSPITAL_COMMUNITY)
Admission: RE | Admit: 2022-07-25 | Discharge: 2022-07-25 | Disposition: A | Discharge status: Home / Self Care | Payer: BC Managed Care – PPO | Source: Ambulatory Visit | Attending: Cardiovascular Disease | Admitting: Cardiovascular Disease

## 2022-07-25 DIAGNOSIS — I480 Paroxysmal atrial fibrillation: Secondary | ICD-10-CM | POA: Insufficient documentation

## 2022-07-25 DIAGNOSIS — I200 Unstable angina: Secondary | ICD-10-CM | POA: Insufficient documentation

## 2022-07-25 DIAGNOSIS — R9431 Abnormal electrocardiogram [ECG] [EKG]: Secondary | ICD-10-CM | POA: Insufficient documentation

## 2022-07-25 MED ORDER — NITROGLYCERIN 0.4 MG SL SUBL
SUBLINGUAL_TABLET | SUBLINGUAL | Status: AC
  Administered 2022-07-25: 0.8 mg via SUBLINGUAL
  Filled 2022-07-25: qty 2

## 2022-07-25 MED ORDER — IOHEXOL 350 MG/ML SOLN
100.0000 mL | Freq: Once | INTRAVENOUS | Status: AC | PRN
  Administered 2022-07-25: 100 mL via INTRAVENOUS

## 2022-07-25 MED ORDER — NITROGLYCERIN 0.4 MG SL SUBL: 0.8000 mg | SUBLINGUAL_TABLET | Freq: Once | SUBLINGUAL | Status: AC

## 2022-07-26 ENCOUNTER — Telehealth: Payer: Self-pay

## 2022-07-26 ENCOUNTER — Ambulatory Visit (INDEPENDENT_AMBULATORY_CARE_PROVIDER_SITE_OTHER): Payer: BC Managed Care – PPO

## 2022-07-26 ENCOUNTER — Ambulatory Visit (HOSPITAL_COMMUNITY): Payer: BC Managed Care – PPO | Attending: Cardiovascular Disease

## 2022-07-26 DIAGNOSIS — I480 Paroxysmal atrial fibrillation: Secondary | ICD-10-CM

## 2022-07-26 DIAGNOSIS — I358 Other nonrheumatic aortic valve disorders: Secondary | ICD-10-CM | POA: Diagnosis not present

## 2022-07-26 DIAGNOSIS — R911 Solitary pulmonary nodule: Secondary | ICD-10-CM

## 2022-07-26 DIAGNOSIS — I200 Unstable angina: Secondary | ICD-10-CM | POA: Diagnosis present

## 2022-07-26 DIAGNOSIS — R9431 Abnormal electrocardiogram [ECG] [EKG]: Secondary | ICD-10-CM | POA: Diagnosis not present

## 2022-07-26 LAB — ECHOCARDIOGRAM COMPLETE
Area-P 1/2: 3.68 cm2
S' Lateral: 2.7 cm

## 2022-07-26 NOTE — Progress Notes (Unsigned)
[id number] mailed to patient 07/16/2022 applied in office 07/26/22.

## 2022-07-26 NOTE — Telephone Encounter (Signed)
-----   Message from Thayer Headings, MD sent at 07/25/2022  5:01 PM EDT ----- 5 mm nodules in the RML. Please refer to the pulmonary nodule clinic for further evaluation   Coronary calcium score :  Total: 3.35 Percentile: 71st  RCA - minimal , non obstructive plaque LM - normal  LAD - large and normal vessel LCx - non-dominate vessel,  small OM,  no plaque   Her CP / DOE do not appear to be coming from a cardiac etiology   Could be due to her levothyroxine over-replacmenet ( which has be reduced )

## 2022-07-26 NOTE — Telephone Encounter (Signed)
Called and spoke with patient who understands results of scan. She states that she cannot tolerate statins. Educated on need to watch dietary sources of cholesterol. She understands the finding of the pulmonary nodule and that a referral has been placed. No further questions.

## 2022-08-06 ENCOUNTER — Telehealth: Payer: Self-pay | Admitting: Cardiovascular Disease

## 2022-08-06 NOTE — Telephone Encounter (Signed)
Pt calling stating that she spoke with the company of the heart monitor and they told her her number 1 monitor is working good but her number two monitor is not working at all. She states they tried to reboot over the phone but its still not working. Please advise

## 2022-08-06 NOTE — Telephone Encounter (Signed)
Patient can remove Monitor battery #1 when it is low. Plug it into charger.  It may take up to 90 minutes to fully charge, but it is usually takes less than that to charge.  Remember when you reapply #1 after charging , you will need to push the button to turn it back on again.

## 2022-08-09 ENCOUNTER — Institutional Professional Consult (permissible substitution): Payer: BC Managed Care – PPO | Admitting: Pulmonary Disease

## 2022-08-16 DIAGNOSIS — R42 Dizziness and giddiness: Secondary | ICD-10-CM | POA: Diagnosis not present

## 2022-08-16 DIAGNOSIS — G43719 Chronic migraine without aura, intractable, without status migrainosus: Secondary | ICD-10-CM | POA: Diagnosis not present

## 2022-08-16 DIAGNOSIS — M542 Cervicalgia: Secondary | ICD-10-CM | POA: Diagnosis not present

## 2022-08-16 DIAGNOSIS — M797 Fibromyalgia: Secondary | ICD-10-CM | POA: Diagnosis not present

## 2022-08-21 ENCOUNTER — Other Ambulatory Visit: Payer: Self-pay | Admitting: Endocrinology

## 2022-08-21 DIAGNOSIS — E039 Hypothyroidism, unspecified: Secondary | ICD-10-CM

## 2022-09-03 ENCOUNTER — Ambulatory Visit (INDEPENDENT_AMBULATORY_CARE_PROVIDER_SITE_OTHER): Payer: BC Managed Care – PPO | Admitting: Pulmonary Disease

## 2022-09-03 ENCOUNTER — Encounter: Payer: Self-pay | Admitting: Pulmonary Disease

## 2022-09-03 VITALS — BP 124/72 | HR 76 | Temp 98.4°F | Ht 65.0 in | Wt 207.4 lb

## 2022-09-03 DIAGNOSIS — R918 Other nonspecific abnormal finding of lung field: Secondary | ICD-10-CM | POA: Diagnosis not present

## 2022-09-03 NOTE — Progress Notes (Signed)
Synopsis: Referred in December 2023 for pulmonary nodules by Nahser, Deloris Ping, MD  Subjective:   PATIENT ID: Holly [name] GENDER: female DOB: September 25, 1961, MRN: [id number]  Chief Complaint  Patient presents with   Follow-up    Scan review    This is a 61 year old female, past medical history of anxiety, diabetes, IBS, migraine.  Had a pulmonary nodule in the right middle lobe followed by Dr. Edwyna Shell.  Had a repeat CT scan in 2010 that showed stability of previous nodule.  Had a recent coronary calcium CT scan that showed 5 mm pulmonary nodule.  I reviewed the CT images today with the patient.  She did have a lower lobe small pulmonary nodule that has not had follow-up in many years.  From respiratory standpoint she has no complaints.  She is up-to-date on all of her cancer screening to include breast cancer screening with mammogram and colonoscopy.  Patient has not had any solid tumor malignancies in the past.    Past Medical History:  Diagnosis Date   Anxiety    takes Ativan daily   Back pain    stenosis and buldging disc   Bone spur    neck and buldging disc   Depression    takes Cymbalta daily   Diabetes (HCC)    takes Metformin daily   Diverticulosis    GERD (gastroesophageal reflux disease)    takes Prevacid daily   History of bronchitis    > 5 yrs ago   History of hiatal hernia    Hypothyroidism    takes Synthroid daily   IBS (irritable bowel syndrome)    Insomnia    doesn't take any meds   Joint pain    Lung nodule    right middle lobe-was being followed by Dr.Burney.Medical Md is keeping up with this   Migraine    Migraine    last one 08/28/15   Nocturia    Numbness and tingling in hands    PONV (postoperative nausea and vomiting)    Sleep apnea    Thoracic outlet syndrome    TOS (thoracic outlet syndrome)    Urinary urgency      Family History  Problem Relation Age of Onset   COPD Mother    Cancer Mother    Heart disease Paternal  Grandmother    Heart disease Paternal Grandfather    Heart disease Maternal Grandmother    Heart disease Maternal Grandfather      Past Surgical History:  Procedure Laterality Date   ABDOMINAL EXPLORATION SURGERY     cancer cells in cervix   CARPAL TUNNEL RELEASE Right 09/05/2015   Procedure: RIGHT OPEN CARPAL TUNNEL RELEASE, RIGHT LONG FINGER TRIGGER RELEASE,RIGHT INDEX FINGER TRIGGER RELEASE;  Surgeon: Kerrin Champagne, MD;  Location: [facility];  Service: Orthopedics;  Laterality: Right;   CARPAL TUNNEL RELEASE Left 08/11/2019   Procedure: LEFT OPEN CARPAL TUNNEL RELEASE AND LEFT LONG FINGER TRIGGER FINGER RELEASE;  Surgeon: Kerrin Champagne, MD;  Location: [facility];  Service: Orthopedics;  Laterality: Left;   CHOLECYSTECTOMY     COLONOSCOPY     ESOPHAGOGASTRODUODENOSCOPY     THYROIDECTOMY     TONSILLECTOMY     adenoidectomy   TRIGGER FINGER RELEASE Right 09/05/2015   Procedure: RELEASE TRIGGER FINGER/A-1 PULLEY;  Surgeon: Kerrin Champagne, MD;  Location: [facility];  Service: Orthopedics;  Laterality: Right;   TRIGGER FINGER RELEASE Left 08/11/2019   Procedure: RELEASE TRIGGER FINGER/A-1 PULLEY;  Surgeon: Otelia Sergeant,  Daleen Bo, MD;  Location: [facility];  Service: Orthopedics;  Laterality: Left;    Social History   Socioeconomic History   Marital status: Married    Spouse name: Not on file   Number of children: Not on file   Years of education: Not on file   Highest education level: Not on file  Occupational History   Not on file  Tobacco Use   Smoking status: Former    Types: Cigarettes   Smokeless tobacco: Never  Substance and Sexual Activity   Alcohol use: No   Drug use: No   Sexual activity: Yes    Birth control/protection: Post-menopausal  Other Topics Concern   Not on file  Social History Narrative   Not on file   Social Determinants of Health   Financial Resource Strain: Not on file  Food Insecurity: Not on file  Transportation Needs: Not on file   Physical Activity: Not on file  Stress: Not on file  Social Connections: Not on file  Intimate Partner Violence: Not on file     Allergies  Allergen Reactions   Cefuroxime Axetil Other (See Comments)    Obsessive Bowel.    Effexor Xr [Venlafaxine Hcl Er] Hives   Sumatriptan Other (See Comments)    Other Reaction: heart issues    Erenumab-Aooe Hives    " Smooth Muscle cramping in Esophagus "   Garlic Other (See Comments)   Onion Other (See Comments)   Statins    Crestor [Rosuvastatin Calcium] Rash   Triptans Palpitations     Outpatient Medications Prior to Visit  Medication Sig Dispense Refill   acetaminophen (TYLENOL) 500 MG tablet Take 500 mg by mouth every 6 (six) hours as needed.     amitriptyline (ELAVIL) 10 MG tablet TAKE 1 TABLET BY MOUTH EVERYDAY AT BEDTIME 90 tablet 1   apixaban (ELIQUIS) 5 MG TABS tablet Take 1 tablet (5 mg total) by mouth 2 (two) times daily. 60 tablet 11   B Complex-Folic Acid (B COMPLEX FORMULA 1) TABS Take by mouth.     Buprenorphine HCl-Naloxone HCl 4-1 MG FILM Place 2 Film under the tongue 2 (two) times daily.     clobetasol ointment (TEMOVATE) 0.05 % clobetasol 0.05 % topical ointment     conjugated estrogens (PREMARIN) 0.625 MG/GM vaginal cream Place vaginally.     diclofenac Sodium (VOLTAREN) 1 % GEL APPLY 2 GRAMS TO AFFECTED AREA 4 TIMES A DAY 300 g 2   diclofenac Sodium (VOLTAREN) 1 % GEL Apply 4 g topically 4 (four) times daily. 350 g 3   DiphenhydrAMINE HCl (BENADRYL ALLERGY PO) Take by mouth as needed. Taken with advil for migraine management.     famotidine (PEPCID) 20 MG tablet Take 20 mg by mouth 2 (two) times daily.     glucose blood test strip      levothyroxine (SYNTHROID) 100 MCG tablet Take 100 mcg by mouth daily.  0   LORazepam (ATIVAN) 2 MG tablet Take 2 mg by mouth 4 (four) times daily.     metFORMIN (GLUCOPHAGE) 500 MG tablet Take 1,000 mg by mouth 2 (two) times daily with a meal.      metoprolol tartrate (LOPRESSOR) 100 MG  tablet Take 1 tablet by mouth two hours prior to scan 1 tablet 0   OnabotulinumtoxinA (BOTOX IJ) Inject as directed every 3 (three) months. For migraine prevention.     VITAMIN D, ERGOCALCIFEROL, PO Take 2,000 mg by mouth daily.     ibuprofen (  ADVIL) 800 MG tablet TAKE 1 TABLET (800 MG TOTAL) BY MOUTH 2 (TWO) TIMES DAILY AFTER A MEAL. 60 tablet 1   No facility-administered medications prior to visit.    Review of Systems  Constitutional:  Negative for chills, fever, malaise/fatigue and weight loss.  HENT:  Negative for hearing loss, sore throat and tinnitus.   Eyes:  Negative for blurred vision and double vision.  Respiratory:  Negative for cough, hemoptysis, sputum production, shortness of breath, wheezing and stridor.   Cardiovascular:  Negative for chest pain, palpitations, orthopnea, leg swelling and PND.  Gastrointestinal:  Negative for abdominal pain, constipation, diarrhea, heartburn, nausea and vomiting.  Genitourinary:  Negative for dysuria, hematuria and urgency.  Musculoskeletal:  Negative for joint pain and myalgias.  Skin:  Negative for itching and rash.  Neurological:  Negative for dizziness, tingling, weakness and headaches.  Endo/Heme/Allergies:  Negative for environmental allergies. Does not bruise/bleed easily.  Psychiatric/Behavioral:  Negative for depression. The patient is not nervous/anxious and does not have insomnia.   All other systems reviewed and are negative.    Objective:  Physical Exam Vitals reviewed.  Constitutional:      General: She is not in acute distress.    Appearance: She is well-developed. She is obese.  HENT:     Head: Normocephalic and atraumatic.  Eyes:     General: No scleral icterus.    Conjunctiva/sclera: Conjunctivae normal.     Pupils: Pupils are equal, round, and reactive to light.  Neck:     Vascular: No JVD.     Trachea: No tracheal deviation.  Cardiovascular:     Rate and Rhythm: Normal rate and regular rhythm.     Heart  sounds: Normal heart sounds. No murmur heard. Pulmonary:     Effort: Pulmonary effort is normal. No tachypnea, accessory muscle usage or respiratory distress.     Breath sounds: Normal breath sounds. No stridor. No wheezing, rhonchi or rales.  Abdominal:     General: Bowel sounds are normal. There is no distension.     Palpations: Abdomen is soft.     Tenderness: There is no abdominal tenderness.  Musculoskeletal:        General: No tenderness.     Cervical back: Neck supple.  Lymphadenopathy:     Cervical: No cervical adenopathy.  Skin:    General: Skin is warm and dry.     Capillary Refill: Capillary refill takes less than 2 seconds.     Findings: No rash.  Neurological:     Mental Status: She is alert and oriented to person, place, and time.  Psychiatric:        Behavior: Behavior normal.      Vitals:   09/03/22 1542  BP: 124/72  Pulse: 76  Temp: 98.4 F (36.9 C)  TempSrc: Oral  SpO2: 97%  Weight: 207 lb 6.4 oz (94.1 kg)  Height: 5\' 5"  (1.651 m)   97% on RA BMI Readings from Last 3 Encounters:  09/03/22 34.51 kg/m  07/13/22 34.15 kg/m  06/07/22 34.70 kg/m   Wt Readings from Last 3 Encounters:  09/03/22 207 lb 6.4 oz (94.1 kg)  07/13/22 205 lb 3.2 oz (93.1 kg)  06/07/22 208 lb 8 oz (94.6 kg)     CBC    Component Value Date/Time   WBC 4.8 08/07/2019 1145   RBC 4.54 08/07/2019 1145   HGB 13.6 08/07/2019 1145   HCT 38.0 08/07/2019 1145   PLT 160 08/07/2019 1145   MCV 83.7 08/07/2019  1145   MCH 30.0 08/07/2019 1145   MCHC 35.8 08/07/2019 1145   RDW 12.1 08/07/2019 1145   LYMPHSABS 2.0 10/27/2016 1706   MONOABS 0.4 10/27/2016 1706   EOSABS 0.1 10/27/2016 1706   BASOSABS 0.0 10/27/2016 1706     Chest Imaging:  October 2023 coronary CT imaging: 5 mm right middle lobe pulmonary nodule. The patient's images have been independently reviewed by me.    Pulmonary Functions Testing Results:     No data to display          FeNO:   Pathology:    Echocardiogram:   Heart Catheterization:     Assessment & Plan:     ICD-10-CM   1. Multiple lung nodules  R91.8 CT Chest Wo Contrast      Discussion:  This is a 61 year old female, multiple pulmonary nodules.  Incidentally found.  Up-to-date on current cancer screening.  She smoked a few cigarettes as a teenager.  Never been a lifelong smoker.  No other significant risk factors for malignancy.  No direct family history of mother or father with lung cancer.  Plan: Will recommend a repeat noncontrasted CT scan of the chest in 1 year. If this document stability at that time she will likely need no additional follow-up. We have reviewed her previous images as well today in the office. Patient is agreeable to this plan. RTC with SG, NP in 1 year after CT complete.   Current Outpatient Medications:    acetaminophen (TYLENOL) 500 MG tablet, Take 500 mg by mouth every 6 (six) hours as needed., Disp: , Rfl:    amitriptyline (ELAVIL) 10 MG tablet, TAKE 1 TABLET BY MOUTH EVERYDAY AT BEDTIME, Disp: 90 tablet, Rfl: 1   apixaban (ELIQUIS) 5 MG TABS tablet, Take 1 tablet (5 mg total) by mouth 2 (two) times daily., Disp: 60 tablet, Rfl: 11   B Complex-Folic Acid (B COMPLEX FORMULA 1) TABS, Take by mouth., Disp: , Rfl:    Buprenorphine HCl-Naloxone HCl 4-1 MG FILM, Place 2 Film under the tongue 2 (two) times daily., Disp: , Rfl:    clobetasol ointment (TEMOVATE) 0.05 %, clobetasol 0.05 % topical ointment, Disp: , Rfl:    conjugated estrogens (PREMARIN) 0.625 MG/GM vaginal cream, Place vaginally., Disp: , Rfl:    diclofenac Sodium (VOLTAREN) 1 % GEL, APPLY 2 GRAMS TO AFFECTED AREA 4 TIMES A DAY, Disp: 300 g, Rfl: 2   diclofenac Sodium (VOLTAREN) 1 % GEL, Apply 4 g topically 4 (four) times daily., Disp: 350 g, Rfl: 3   DiphenhydrAMINE HCl (BENADRYL ALLERGY PO), Take by mouth as needed. Taken with advil for migraine management., Disp: , Rfl:    famotidine (PEPCID) 20 MG tablet, Take 20 mg by mouth  2 (two) times daily., Disp: , Rfl:    glucose blood test strip, , Disp: , Rfl:    levothyroxine (SYNTHROID) 100 MCG tablet, Take 100 mcg by mouth daily., Disp: , Rfl: 0   LORazepam (ATIVAN) 2 MG tablet, Take 2 mg by mouth 4 (four) times daily., Disp: , Rfl:    metFORMIN (GLUCOPHAGE) 500 MG tablet, Take 1,000 mg by mouth 2 (two) times daily with a meal. , Disp: , Rfl:    metoprolol tartrate (LOPRESSOR) 100 MG tablet, Take 1 tablet by mouth two hours prior to scan, Disp: 1 tablet, Rfl: 0   OnabotulinumtoxinA (BOTOX IJ), Inject as directed every 3 (three) months. For migraine prevention., Disp: , Rfl:    VITAMIN D, ERGOCALCIFEROL, PO, Take 2,000 mg  by mouth daily., Disp: , Rfl:    ibuprofen (ADVIL) 800 MG tablet, TAKE 1 TABLET (800 MG TOTAL) BY MOUTH 2 (TWO) TIMES DAILY AFTER A MEAL., Disp: 60 tablet, Rfl: 1    Garner Nash, DO [facility] 09/03/2022 4:32 PM

## 2022-09-03 NOTE — Patient Instructions (Addendum)
Thank you for visiting Dr. Tonia Brooms at [facility]. Today we recommend the following:  Orders Placed This Encounter  Procedures   CT Chest Wo Contrast   Return in about 9 months (around 06/05/2023) for with Kandice Robinsons, NP.    Please do your part to reduce the spread of COVID-19.

## 2022-09-04 ENCOUNTER — Other Ambulatory Visit: Payer: Self-pay | Admitting: Gastroenterology

## 2022-09-04 ENCOUNTER — Ambulatory Visit
Admission: RE | Admit: 2022-09-04 | Discharge: 2022-09-04 | Disposition: A | Discharge status: Home / Self Care | Payer: BC Managed Care – PPO | Source: Ambulatory Visit | Attending: Endocrinology | Admitting: Endocrinology

## 2022-09-04 DIAGNOSIS — E039 Hypothyroidism, unspecified: Secondary | ICD-10-CM

## 2022-09-04 DIAGNOSIS — K760 Fatty (change of) liver, not elsewhere classified: Secondary | ICD-10-CM

## 2022-09-18 ENCOUNTER — Ambulatory Visit
Admission: RE | Admit: 2022-09-18 | Discharge: 2022-09-18 | Disposition: A | Discharge status: Home / Self Care | Payer: BC Managed Care – PPO | Source: Ambulatory Visit | Attending: Pulmonary Disease | Admitting: Pulmonary Disease

## 2022-09-18 DIAGNOSIS — R918 Other nonspecific abnormal finding of lung field: Secondary | ICD-10-CM

## 2022-09-19 ENCOUNTER — Ambulatory Visit
Admission: RE | Admit: 2022-09-19 | Discharge: 2022-09-19 | Disposition: A | Discharge status: Home / Self Care | Payer: BC Managed Care – PPO | Source: Ambulatory Visit | Attending: Gastroenterology | Admitting: Gastroenterology

## 2022-09-19 DIAGNOSIS — K760 Fatty (change of) liver, not elsewhere classified: Secondary | ICD-10-CM

## 2022-09-21 NOTE — Progress Notes (Signed)
Please let patient know I have reviewed her CT imaging.  Nodules are stable.  No additional follow-up needed at this time.  Message also sent via MyChart  Thanks,  BLI  Holly Igo, DO [facility] 09/21/2022 3:08 PM

## 2022-09-27 ENCOUNTER — Other Ambulatory Visit: Payer: Self-pay | Admitting: Cardiovascular Disease

## 2022-09-27 DIAGNOSIS — I480 Paroxysmal atrial fibrillation: Secondary | ICD-10-CM

## 2022-09-27 DIAGNOSIS — I200 Unstable angina: Secondary | ICD-10-CM

## 2022-09-27 DIAGNOSIS — R9431 Abnormal electrocardiogram [ECG] [EKG]: Secondary | ICD-10-CM

## 2022-10-05 ENCOUNTER — Encounter (INDEPENDENT_AMBULATORY_CARE_PROVIDER_SITE_OTHER): Payer: BC Managed Care – PPO | Admitting: Ophthalmology

## 2022-10-05 DIAGNOSIS — E113293 Type 2 diabetes mellitus with mild nonproliferative diabetic retinopathy without macular edema, bilateral: Secondary | ICD-10-CM

## 2022-10-05 DIAGNOSIS — H43813 Vitreous degeneration, bilateral: Secondary | ICD-10-CM

## 2022-10-18 ENCOUNTER — Encounter: Payer: Self-pay | Admitting: Cardiovascular Disease

## 2022-10-18 NOTE — Progress Notes (Signed)
Cardiology Office Note:    Date:  10/20/2022   ID:  Holly [name], DOB 06/04/1961, MRN [id number]  PCP:  Holly Aspen, MD   [facility] Providers Cardiologist:  Holly [name]     Referring MD: Holly [name], *   Chief Complaint  Patient presents with   Abnormal ECG    History of Present Illness:    Holly [name] is a 62 y.o. female with a hx of shortness of breath, ,DM, obesity , hypothyroidism . She recently has had some arrhythmias and a Kardia mobile showed some atrial fib   She recorded an eipsode of PAF detected by her Holly [name] monitor on Feb 11, 2022 at 10:13 PM Occurred a month after she had lowered her levothyroxine  CHADS2VASC is 2 ( female, DM)  I recommended that we start Eliquis.  She told me that she has hemorrhoids that bleed frequently and is scared that she will have excessive bleeding.  Of asked her to see a surgeon to see if there is any alternative treatment  Has OSA , wears her CPAP consistently .   Gets good readings on her CPAP feedback .  Does not get exercise  Has been getting winded with any exercise    Yesterday was playing volleyball,  Was sweating and had severe DOE  Has chest pain with exertion  Might last a minute or so  Radiates through to intrascapular region  Associated with dyspnea  Is frequently fatigued   + family hx of CAD Father had carotid art disease in 30s,  then Afib in 48s Maternal grandmother had CAD  Both grandfathers   Has been hyperthyoidism Synthroid was reduced  6 months ago from .112 to 0.100 mg   8 days ago her TSH was 0.110  . Dr. Orson Aloe wanted to lower her synthroid to 0.088 mg  I've agreed with dr. Orson Aloe that she should lower her dose   She needs to see endocrinology - Holly [name] is who she saw previously   Jan. 19, 2024  Holly [name] is seen for follow up of her PAF,  OSA DM She was actively hyperthyroid ( on Synthroid .100 mg a day .   I recommended lowering  her dose to 88 mcg a day   Event monitor ( to evaluate her for PAF)  showed sinus rhythm, rare PVCs , NO atrial fib.  I have reviewed the Kardia event again. She definitely had paroxysmal atrial fib   Tolerates the eliquis except she has some dizzyness with standing .  Occasionally has some bleeding  from her hemmorhoids  Will check CBC today   CHADS2VASC is 3 ( female, CAD, DM )   Coronary CTA showed CAC of 3.35 ( all in prox RCA ) which is 71st percentile of age / sex matched controls    RCA with mild CAD  Echo on Oct. 26 showed normal LV and RV function ,  trivial MR  No structural abnormalities to explain he symptoms  LM - normal  LAD - normal  LCx - normal   She continues to have CP with radiation to the intrascapular region. Saw her chiropractor , was adjusted and felt better    Has tried statins  - did not tolerate  Will repeat lipids, LP(a) ,  will have a low threshold to refer her to the lipid clinic for consideration of PCSK9 inhibitor   Check CBC today  As long as her hemoglobin stays fairly stable, I think will be  safe to continue Eliquis.  If her hemoglobin drops, will need to reconsider and possibly refer her for Watchman procedure.  Past Medical History:  Diagnosis Date   Anxiety    takes Ativan daily   Back pain    stenosis and buldging disc   Bone spur    neck and buldging disc   Depression    takes Cymbalta daily   Diabetes (HCC)    takes Metformin daily   Diverticulosis    GERD (gastroesophageal reflux disease)    takes Prevacid daily   History of bronchitis    > 5 yrs ago   History of hiatal hernia    Hypothyroidism    takes Synthroid daily   IBS (irritable bowel syndrome)    Insomnia    doesn't take any meds   Joint pain    Lung nodule    right middle lobe-was being followed by HollyBurney.Medical Md is keeping up with this   Migraine    Migraine    last one 08/28/15   Nocturia    Numbness and tingling in hands    PONV (postoperative  nausea and vomiting)    Sleep apnea    Thoracic outlet syndrome    TOS (thoracic outlet syndrome)    Urinary urgency     Past Surgical History:  Procedure Laterality Date   ABDOMINAL EXPLORATION SURGERY     cancer cells in cervix   CARPAL TUNNEL RELEASE Right 09/05/2015   Procedure: RIGHT OPEN CARPAL TUNNEL RELEASE, RIGHT LONG FINGER TRIGGER RELEASE,RIGHT INDEX FINGER TRIGGER RELEASE;  Surgeon: Holly Champagne, MD;  Location: [facility];  Service: Orthopedics;  Laterality: Right;   CARPAL TUNNEL RELEASE Left 08/11/2019   Procedure: LEFT OPEN CARPAL TUNNEL RELEASE AND LEFT LONG FINGER TRIGGER FINGER RELEASE;  Surgeon: Holly Champagne, MD;  Location: [facility];  Service: Orthopedics;  Laterality: Left;   CHOLECYSTECTOMY     COLONOSCOPY     ESOPHAGOGASTRODUODENOSCOPY     THYROIDECTOMY     TONSILLECTOMY     adenoidectomy   TRIGGER FINGER RELEASE Right 09/05/2015   Procedure: RELEASE TRIGGER FINGER/A-1 PULLEY;  Surgeon: Holly Champagne, MD;  Location: [facility];  Service: Orthopedics;  Laterality: Right;   TRIGGER FINGER RELEASE Left 08/11/2019   Procedure: RELEASE TRIGGER FINGER/A-1 PULLEY;  Surgeon: Holly Champagne, MD;  Location: [facility];  Service: Orthopedics;  Laterality: Left;    Current Medications: Current Meds  Medication Sig   acetaminophen (TYLENOL) 500 MG tablet Take 500 mg by mouth every 6 (six) hours as needed.   apixaban (ELIQUIS) 5 MG TABS tablet Take 1 tablet (5 mg total) by mouth 2 (two) times daily.   B Complex-Folic Acid (B COMPLEX FORMULA 1) TABS Take by mouth.   baclofen (LIORESAL) 10 MG tablet TAKE 1 TABLET (10 MG TOTAL) BY MOUTH 2 (TWO) TIMES DAILY AS NEEDED (NECK PAIN AND HEADACHES)   Buprenorphine HCl-Naloxone HCl 4-1 MG FILM Place 2 Film under the tongue 2 (two) times daily.   Cholecalciferol 1.25 MG (50000 UT) capsule TAKE 2 CAPSULES BY MOUTH EVERY WEEK X 3 MONTHS   clobetasol ointment (TEMOVATE) 0.05 % clobetasol 0.05 % topical ointment    conjugated estrogens (PREMARIN) 0.625 MG/GM vaginal cream Place vaginally.   diclofenac Sodium (VOLTAREN) 1 % GEL Apply 4 g topically 4 (four) times daily.   DiphenhydrAMINE HCl (BENADRYL ALLERGY PO) Take by mouth as needed. Taken with advil for migraine management.   estradiol (ESTRACE) 0.1 MG/GM vaginal  cream PLACE A BLUEBERRY SIZED DROP OF CREAM INTO VAGINA AND AROUND URETHRA TWICE WEEKLY   famotidine (PEPCID) 20 MG tablet Take 20 mg by mouth 2 (two) times daily.   glucose blood test strip    linaclotide (LINZESS) 145 MCG CAPS capsule TAKE 1 CAP BY MOUTH EVERY DAY AT LEAST 30 MINUTES BEFORE THE 1ST MAIN MEAL OF DAY ON EMPTY STOMACH   LORazepam (ATIVAN) 2 MG tablet Take 2 mg by mouth 4 (four) times daily.   metFORMIN (GLUCOPHAGE) 500 MG tablet Take 1,000 mg by mouth 2 (two) times daily with a meal.    omeprazole (PRILOSEC) 40 MG capsule Take 40 mg by mouth every morning. 1 Capsule(s) By Mouth Every Morning   OnabotulinumtoxinA (BOTOX IJ) Inject as directed every 3 (three) months. For migraine prevention.   SYNTHROID 88 MCG tablet Take 88 mcg by mouth daily before breakfast.   VITAMIN D, ERGOCALCIFEROL, PO Take 2,000 mg by mouth daily.   [DISCONTINUED] metoprolol tartrate (LOPRESSOR) 100 MG tablet Take 1 tablet by mouth two hours prior to scan     Allergies:   Cefuroxime axetil, Effexor xr [venlafaxine hcl er], Sumatriptan, Erenumab-aooe, Garlic, Onion, Statins, Crestor [rosuvastatin calcium], and Triptans   Social History   Socioeconomic History   Marital status: Married    Spouse name: Not on file   Number of children: Not on file   Years of education: Not on file   Highest education level: Not on file  Occupational History   Not on file  Tobacco Use   Smoking status: Former    Types: Cigarettes   Smokeless tobacco: Never  Substance and Sexual Activity   Alcohol use: No   Drug use: No   Sexual activity: Yes    Birth control/protection: Post-menopausal  Other Topics Concern    Not on file  Social History Narrative   Not on file   Social Determinants of Health   Financial Resource Strain: Not on file  Food Insecurity: Not on file  Transportation Needs: Not on file  Physical Activity: Not on file  Stress: Not on file  Social Connections: Not on file     Family History: The patient's family history includes COPD in her mother; Cancer in her mother; Heart disease in her maternal grandfather, maternal grandmother, paternal grandfather, and paternal grandmother.  ROS:   Please see the history of present illness.     All other systems reviewed and are negative.  EKGs/Labs/Other Studies Reviewed:    The following studies were reviewed today:   EKG:    Recent Labs: 10/19/2022: BUN 11; Creatinine, Ser 0.88; Hemoglobin 14.3; Platelets 170; Potassium 4.0; Sodium 138  Recent Lipid Panel    Component Value Date/Time   CHOL 190 10/19/2022 1700   TRIG 146 10/19/2022 1700   HDL 41 10/19/2022 1700   CHOLHDL 4.6 (H) 10/19/2022 1700   CHOLHDL 7.0 05/29/2007 0505   VLDL 79 (H) 05/29/2007 0505   LDLCALC 123 (H) 10/19/2022 1700     Risk Assessment/Calculations:    CHA2DS2-VASc Score = 2   This indicates a 2.2% annual risk of stroke. The patient's score is based upon: CHF History: 0 HTN History: 0 Diabetes History: 1 Stroke History: 0 Vascular Disease History: 0 Age Score: 0 Gender Score: 1             Physical Exam:    Physical Exam: Blood pressure 122/78, pulse 78, height 5\' 5"  (1.651 m), weight 208 lb 12.8 oz (94.7 kg), SpO2 97 %.  GEN:  Well nourished, well developed in no acute distress HEENT: Normal NECK: No JVD; No carotid bruits LYMPHATICS: No lymphadenopathy CARDIAC: RRR , no murmurs, rubs, gallops RESPIRATORY:  Clear to auscultation without rales, wheezing or rhonchi  ABDOMEN: Soft, non-tender, non-distended MUSCULOSKELETAL:  No edema; No deformity  SKIN: Warm and dry NEUROLOGIC:  Alert and oriented x 3   ASSESSMENT:     1. Mixed hyperlipidemia   2. PAF (paroxysmal atrial fibrillation) ([place])   3. Coronary artery disease involving native coronary artery of native heart without angina pectoris   4. Type 2 diabetes mellitus without complication, unspecified whether long term insulin use (HCC)     PLAN:      Paroxysmal atrial fibrillation:  she had an episode of PAF documented on her Kardia mobile.   This occurred when she was on a higher dose of synthroid 30 day event monitor did not show any evidence of Afib .  We have discussed the fact that her Afib will likely worsen with age.    At present her CHA2DS2-VASc score is 2  Continue Eliquis  We discussed the role of Watchman.     2.   Chest pain:    coronary CTA showed mild CAD .  + coronary calcium in the RCA . Cont lipid management Check LP(a)      Follow up with me in 6 months       Medication Adjustments/Labs and Tests Ordered: Current medicines are reviewed at length with the patient today.  Concerns regarding medicines are outlined above.  Orders Placed This Encounter  Procedures   Lipid panel   Lipoprotein A (LPA)   CBC   Basic metabolic panel   No orders of the defined types were placed in this encounter.   Patient Instructions  Medication Instructions:  Your physician recommends that you continue on your current medications as directed. Please refer to the Current Medication list given to you today.  *If you need a refill on your cardiac medications before your next appointment, please call your pharmacy*   Lab Work: CBC, BMET, Lipoprotein(a), Lipids today If you have labs (blood work) drawn today and your tests are completely normal, you will receive your results only by: [place] (if you have MyChart) OR A paper copy in the mail If you have any lab test that is abnormal or we need to change your treatment, we will call you to review the results.   Testing/Procedures: NONE   Follow-Up: At [facility], you and your health needs are our priority.  As part of our continuing mission to provide you with exceptional heart care, we have created designated Provider Care Teams.  These Care Teams include your primary Cardiologist (physician) and Advanced Practice Providers (APPs -  Physician Assistants and Nurse Practitioners) who all work together to provide you with the care you need, when you need it.  We recommend signing up for the patient portal called "MyChart".  Sign up information is provided on this After Visit Summary.  MyChart is used to connect with patients for Virtual Visits (Telemedicine).  Patients are able to view lab/test results, encounter notes, upcoming appointments, etc.  Non-urgent messages can be sent to your provider as well.   To learn more about what you can do with MyChart, go to [email or web address].    Your next appointment:   6 month(s)  Provider:   Mertie Moores, MD  or APP      Signed, Mertie Moores, MD  10/20/2022 9:24 AM    [place]

## 2022-10-19 ENCOUNTER — Encounter: Payer: Self-pay | Admitting: Cardiovascular Disease

## 2022-10-19 ENCOUNTER — Ambulatory Visit: Payer: BC Managed Care – PPO | Attending: Cardiovascular Disease | Admitting: Cardiovascular Disease

## 2022-10-19 VITALS — BP 122/78 | HR 78 | Ht 65.0 in | Wt 208.8 lb

## 2022-10-19 DIAGNOSIS — E119 Type 2 diabetes mellitus without complications: Secondary | ICD-10-CM | POA: Diagnosis not present

## 2022-10-19 DIAGNOSIS — I2510 Atherosclerotic heart disease of native coronary artery without angina pectoris: Secondary | ICD-10-CM

## 2022-10-19 DIAGNOSIS — E782 Mixed hyperlipidemia: Secondary | ICD-10-CM | POA: Diagnosis not present

## 2022-10-19 DIAGNOSIS — I480 Paroxysmal atrial fibrillation: Secondary | ICD-10-CM

## 2022-10-19 NOTE — Patient Instructions (Signed)
Medication Instructions:  Your physician recommends that you continue on your current medications as directed. Please refer to the Current Medication list given to you today.  *If you need a refill on your cardiac medications before your next appointment, please call your pharmacy*   Lab Work: CBC, BMET, Lipoprotein(a), Lipids today If you have labs (blood work) drawn today and your tests are completely normal, you will receive your results only by: [place] (if you have MyChart) OR A paper copy in the mail If you have any lab test that is abnormal or we need to change your treatment, we will call you to review the results.   Testing/Procedures: NONE   Follow-Up: At [facility], you and your health needs are our priority.  As part of our continuing mission to provide you with exceptional heart care, we have created designated Provider Care Teams.  These Care Teams include your primary Cardiologist (physician) and Advanced Practice Providers (APPs -  Physician Assistants and Nurse Practitioners) who all work together to provide you with the care you need, when you need it.  We recommend signing up for the patient portal called "MyChart".  Sign up information is provided on this After Visit Summary.  MyChart is used to connect with patients for Virtual Visits (Telemedicine).  Patients are able to view lab/test results, encounter notes, upcoming appointments, etc.  Non-urgent messages can be sent to your provider as well.   To learn more about what you can do with MyChart, go to [email or web address].    Your next appointment:   6 month(s)  Provider:   Mertie Moores, MD  or APP

## 2022-10-20 LAB — CBC
Hematocrit: 41.4 % (ref 34.0–46.6)
Hemoglobin: 14.3 g/dL (ref 11.1–15.9)
MCH: 30.6 pg (ref 26.6–33.0)
MCHC: 34.5 g/dL (ref 31.5–35.7)
MCV: 89 fL (ref 79–97)
Platelets: 170 10*3/uL (ref 150–450)
RBC: 4.67 x10E6/uL (ref 3.77–5.28)
RDW: 12.7 % (ref 11.7–15.4)
WBC: 5.7 10*3/uL (ref 3.4–10.8)

## 2022-10-20 LAB — BASIC METABOLIC PANEL
BUN/Creatinine Ratio: 13 (ref 9–23)
BUN: 11 mg/dL (ref 6–24)
CO2: 25 mmol/L (ref 20–29)
Calcium: 9.4 mg/dL (ref 8.7–10.2)
Chloride: 98 mmol/L (ref 96–106)
Creatinine, Ser: 0.88 mg/dL (ref 0.57–1.00)
Glucose: 169 mg/dL — ABNORMAL HIGH (ref 70–99)
Potassium: 4 mmol/L (ref 3.5–5.2)
Sodium: 138 mmol/L (ref 134–144)
eGFR: 76 mL/min/{1.73_m2} (ref >59)

## 2022-10-20 LAB — LIPID PANEL
Chol/HDL Ratio: 4.6 ratio — ABNORMAL HIGH (ref 0.0–4.4)
Cholesterol, Total: 190 mg/dL (ref 100–199)
HDL: 41 mg/dL (ref >39)
LDL Chol Calc (NIH): 123 mg/dL — ABNORMAL HIGH (ref 0–99)
Triglycerides: 146 mg/dL (ref 0–149)
VLDL Cholesterol Cal: 26 mg/dL (ref 5–40)

## 2022-10-20 LAB — LIPOPROTEIN A (LPA): Lipoprotein (a): 33.4 nmol/L (ref <75.0)

## 2022-10-22 ENCOUNTER — Telehealth: Payer: Self-pay

## 2022-10-22 DIAGNOSIS — E782 Mixed hyperlipidemia: Secondary | ICD-10-CM

## 2022-10-22 MED ORDER — EZETIMIBE 10 MG PO TABS: 10.0000 mg | ORAL_TABLET | Freq: Every day | ORAL | 3 refills | Status: DC

## 2022-10-22 NOTE — Telephone Encounter (Signed)
The patient has been notified of the result and verbalized understanding.  All questions (if any) were answered. Bernestine Amass, RN 10/22/2022 4:07 PM  Prescription for zetia has been sent in. Labs have been ordered. She will call back to schedule.

## 2022-10-22 NOTE — Telephone Encounter (Signed)
-----  Message from Thayer Headings, MD sent at 10/22/2022  2:23 PM EST ----- She has mild coronary calcium. LDL is 123 She is intolerant to statins Please add Zetia 10 mg a day  Cont with diet, exercise, weight loss  Repeat lipids and ALT in 3 months

## 2022-10-29 ENCOUNTER — Telehealth: Payer: Self-pay | Admitting: Cardiovascular Disease

## 2022-10-29 NOTE — Telephone Encounter (Signed)
Pt c/o medication issue:  1. Name of Medication:  ezetimibe (ZETIA) 10 MG tablet  2. How are you currently taking this medication (dosage and times per day)?   3. Are you having a reaction (difficulty breathing--STAT)?   4. What is your medication issue?   Patient's husband states they read where liver issues may cause interactions with cholesterol medications. He states they also read that this medication should not be taken with blood thinners and she is currently on Eliquis. He would like to confirm that it will be alright for the patient to take.

## 2022-10-29 NOTE — Telephone Encounter (Signed)
Returned call to patient and explained that Zetia is safe with Eliquis. She states it specifically stated Coumadin, so educated her on the many drug interactions of Coumadin and the difference in Eliquis being a factor X inhibitor. Educated that we monitor liver enzymes and functioning with any cholesterol medication to ensure we don't cause damage to liver. She has no further questions and appreciates call.

## 2022-11-08 DIAGNOSIS — M797 Fibromyalgia: Secondary | ICD-10-CM | POA: Diagnosis not present

## 2022-11-08 DIAGNOSIS — G43719 Chronic migraine without aura, intractable, without status migrainosus: Secondary | ICD-10-CM | POA: Diagnosis not present

## 2022-11-08 DIAGNOSIS — M542 Cervicalgia: Secondary | ICD-10-CM | POA: Diagnosis not present

## 2022-11-13 DIAGNOSIS — K080 Exfoliation of teeth due to systemic causes: Secondary | ICD-10-CM | POA: Diagnosis not present

## 2022-11-20 DIAGNOSIS — G4733 Obstructive sleep apnea (adult) (pediatric): Secondary | ICD-10-CM | POA: Diagnosis not present

## 2022-11-21 DIAGNOSIS — E1165 Type 2 diabetes mellitus with hyperglycemia: Secondary | ICD-10-CM | POA: Diagnosis not present

## 2022-11-21 DIAGNOSIS — E7800 Pure hypercholesterolemia, unspecified: Secondary | ICD-10-CM | POA: Diagnosis not present

## 2022-11-21 DIAGNOSIS — R591 Generalized enlarged lymph nodes: Secondary | ICD-10-CM | POA: Diagnosis not present

## 2022-11-21 DIAGNOSIS — E890 Postprocedural hypothyroidism: Secondary | ICD-10-CM | POA: Diagnosis not present

## 2022-12-07 ENCOUNTER — Encounter: Payer: Self-pay | Admitting: Orthopaedic Surgery

## 2022-12-07 ENCOUNTER — Ambulatory Visit (INDEPENDENT_AMBULATORY_CARE_PROVIDER_SITE_OTHER): Payer: BC Managed Care – PPO

## 2022-12-07 ENCOUNTER — Ambulatory Visit (INDEPENDENT_AMBULATORY_CARE_PROVIDER_SITE_OTHER): Payer: BC Managed Care – PPO | Admitting: Orthopaedic Surgery

## 2022-12-07 VITALS — BP 116/72 | HR 66 | Ht 65.0 in | Wt 207.0 lb

## 2022-12-07 DIAGNOSIS — M79671 Pain in right foot: Secondary | ICD-10-CM

## 2022-12-07 DIAGNOSIS — S92501A Displaced unspecified fracture of right lesser toe(s), initial encounter for closed fracture: Secondary | ICD-10-CM | POA: Diagnosis not present

## 2022-12-10 DIAGNOSIS — S92501A Displaced unspecified fracture of right lesser toe(s), initial encounter for closed fracture: Secondary | ICD-10-CM | POA: Insufficient documentation

## 2022-12-10 NOTE — Progress Notes (Signed)
Office Visit Note   Patient: Holly [name]           Date of Birth: 10-11-1960           MRN: [id number] Visit Date: 12/07/2022              Requested by: Kathalene Frames, MD [address], [place],  [place] [id number] PCP: Kathalene Frames, MD   Assessment & Plan: Visit Diagnoses:  1. Pain in right foot   2. Closed fracture of phalanx of right fifth toe, initial encounter     Plan: Will place her in a postop shoe for her toe fracture.  We discussed she is to have adequate arch buildup.  She can follow-up if she has ongoing problems.  Follow-Up Instructions: No follow-ups on file.   Orders:  Orders Placed This Encounter  Procedures   XR Foot Complete Right   No orders of the defined types were placed in this encounter.     Procedures: No procedures performed   Clinical Data: No additional findings.   Subjective: Chief Complaint  Patient presents with   Right Foot - Pain    HPI 62 year old female injured her toe when she kicked a piece of wood today that was under a heater accidentally.  She had difficulty moving her third fourth and fifth toe.  States about a month ago she felt a pop in her right toe at the first MTP joint and she had been started on some new medications.  She states she feels like her arch is falling and she points over the posterior tibial tendon insertion site and medial aspect of the arch where she is having pain.  She has type 2 diabetes history of atrial fibs and is on blood thinner has not noticed any ecchymosis in her foot.  She has been treated by Dr. Louanne Skye in the past.  Review of Systems problems with knee arthritis carpal tunnel, trigger finger, type 2 diabetes on Glucophage.  Eliquis for atrial fibs.  All systems noncontributory HPI.   Objective: Vital Signs: BP 116/72   Pulse 66   Ht '5\' 5"'$  (1.651 m)   Wt 207 lb (93.9 kg)   BMI 34.45 kg/m   Physical Exam Constitutional:      Appearance:  She is well-developed.  HENT:     Head: Normocephalic.     Right Ear: External ear normal.     Left Ear: External ear normal. There is no impacted cerumen.  Eyes:     Pupils: Pupils are equal, round, and reactive to light.  Neck:     Thyroid: No thyromegaly.     Trachea: No tracheal deviation.  Cardiovascular:     Rate and Rhythm: Normal rate.  Pulmonary:     Effort: Pulmonary effort is normal.  Abdominal:     Palpations: Abdomen is soft.  Musculoskeletal:     Cervical back: No rigidity.  Skin:    General: Skin is warm and dry.  Neurological:     Mental Status: She is alert and oriented to person, place, and time.  Psychiatric:        Behavior: Behavior normal.     Ortho Exam patient has some tenderness at the common digital nerves between the metatarsal heads between great toe second toe second third third and fourth and fourth and fifth.  No ecchymosis.  Sensation of the toes are intact no plantar foot lesions.  Tenderness from the posterior tib tendon at  the medial malleolus distally.  Some tenderness over the dorsal tarsal bones with palpation.  Pulses are normal.  Significant tenderness of the base of the fifth toe without rotation or angular deformity.  No tenderness over the peroneal tendons.  She does have tenderness palpation of the posterior tibial tendon.  Specialty Comments:  No specialty comments available.  Imaging: No results found.   PMFS History: Patient Active Problem List   Diagnosis Date Noted   Closed fracture of fifth toe of right foot 12/10/2022   Carpal tunnel syndrome, left upper limb 08/11/2019    Class: Chronic   DJD (degenerative joint disease), cervical 09/18/2016   Other fatigue 09/18/2016   Fibromyalgia 09/18/2016   Primary osteoarthritis of both knees 09/18/2016   Primary insomnia 09/18/2016   Vitamin D deficiency 09/18/2016   Carpal tunnel syndrome, right 09/05/2015    Class: Chronic   Trigger finger, left middle finger 09/05/2015     Class: Chronic   Pulmonary nodules 01/16/2013   Dyspnea 01/13/2013   Past Medical History:  Diagnosis Date   Anxiety    takes Ativan daily   Back pain    stenosis and buldging disc   Bone spur    neck and buldging disc   Depression    takes Cymbalta daily   Diabetes ([place])    takes Metformin daily   Diverticulosis    GERD (gastroesophageal reflux disease)    takes Prevacid daily   History of bronchitis    > 5 yrs ago   History of hiatal hernia    Hypothyroidism    takes Synthroid daily   IBS (irritable bowel syndrome)    Insomnia    doesn't take any meds   Joint pain    Lung nodule    right middle lobe-was being followed by Dr.Burney.Medical Md is keeping up with this   Migraine    Migraine    last one 08/28/15   Nocturia    Numbness and tingling in hands    PONV (postoperative nausea and vomiting)    Sleep apnea    Thoracic outlet syndrome    TOS (thoracic outlet syndrome)    Urinary urgency     Family History  Problem Relation Age of Onset   COPD Mother    Cancer Mother    Heart disease Paternal Grandmother    Heart disease Paternal Grandfather    Heart disease Maternal Grandmother    Heart disease Maternal Grandfather     Past Surgical History:  Procedure Laterality Date   ABDOMINAL EXPLORATION SURGERY     cancer cells in cervix   CARPAL TUNNEL RELEASE Right 09/05/2015   Procedure: RIGHT OPEN CARPAL TUNNEL RELEASE, RIGHT LONG FINGER TRIGGER RELEASE,RIGHT INDEX FINGER TRIGGER RELEASE;  Surgeon: Jessy Oto, MD;  Location: [place];  Service: Orthopedics;  Laterality: Right;   CARPAL TUNNEL RELEASE Left 08/11/2019   Procedure: LEFT OPEN CARPAL TUNNEL RELEASE AND LEFT LONG FINGER TRIGGER FINGER RELEASE;  Surgeon: Jessy Oto, MD;  Location: [place] [facility];  Service: Orthopedics;  Laterality: Left;   CHOLECYSTECTOMY     COLONOSCOPY     ESOPHAGOGASTRODUODENOSCOPY     THYROIDECTOMY     TONSILLECTOMY     adenoidectomy   TRIGGER FINGER RELEASE  Right 09/05/2015   Procedure: RELEASE TRIGGER FINGER/A-1 PULLEY;  Surgeon: Jessy Oto, MD;  Location: [place];  Service: Orthopedics;  Laterality: Right;   TRIGGER FINGER RELEASE Left 08/11/2019   Procedure: RELEASE TRIGGER FINGER/A-1 PULLEY;  Surgeon:  Jessy Oto, MD;  Location: [place];  Service: Orthopedics;  Laterality: Left;   Social History   Occupational History   Not on file  Tobacco Use   Smoking status: Former    Types: Cigarettes   Smokeless tobacco: Never  Substance and Sexual Activity   Alcohol use: No   Drug use: No   Sexual activity: Yes    Birth control/protection: Post-menopausal

## 2022-12-12 DIAGNOSIS — K080 Exfoliation of teeth due to systemic causes: Secondary | ICD-10-CM | POA: Diagnosis not present

## 2022-12-13 DIAGNOSIS — I480 Paroxysmal atrial fibrillation: Secondary | ICD-10-CM | POA: Diagnosis not present

## 2022-12-13 DIAGNOSIS — K317 Polyp of stomach and duodenum: Secondary | ICD-10-CM | POA: Diagnosis not present

## 2022-12-13 DIAGNOSIS — R112 Nausea with vomiting, unspecified: Secondary | ICD-10-CM | POA: Diagnosis not present

## 2022-12-13 DIAGNOSIS — K219 Gastro-esophageal reflux disease without esophagitis: Secondary | ICD-10-CM | POA: Diagnosis not present

## 2022-12-14 ENCOUNTER — Telehealth: Payer: Self-pay | Admitting: *Deleted

## 2022-12-14 ENCOUNTER — Other Ambulatory Visit: Payer: Self-pay | Admitting: Endocrinology

## 2022-12-14 DIAGNOSIS — I889 Nonspecific lymphadenitis, unspecified: Secondary | ICD-10-CM

## 2022-12-14 NOTE — Telephone Encounter (Signed)
Patient with diagnosis of atrial fibrillation on Eliquis for anticoagulation.    Procedure: endoscopy Date of procedure: TBD   CHA2DS2-VASc Score = 3   This indicates a 3.2% annual risk of stroke. The patient's score is based upon: CHF History: 0 HTN History: 0 Diabetes History: 1 Stroke History: 0 Vascular Disease History: 1 Age Score: 0 Gender Score: 1    CrCl 103 Platelet count 170  Per office protocol, patient can hold Eliquis for 2 days prior to procedure.   Patient will not need bridging with Lovenox (enoxaparin) around procedure.  **This guidance is not considered finalized until pre-operative APP has relayed final recommendations.**

## 2022-12-14 NOTE — Telephone Encounter (Signed)
   Pre-operative Risk Assessment    Patient Name: Holly [name]  DOB: 17-Sep-1961 MRN: [id number]      Request for Surgical Clearance    Procedure:   ENDOSCOPY  Date of Surgery:  Clearance TBD                                 Surgeon:  DR. Michail Sermon OR DR. [facility] Surgeon's Group or Practice Name:  [facility] Phone number:  [phone] Fax number:  [phone]   Type of Clearance Requested:   - Pharmacy:  Hold Apixaban (Eliquis) NOT INDICATED HOW LONG   Type of Anesthesia:  Not Indicated   Additional requests/questions:    Astrid Divine   12/14/2022, 12:29 PM

## 2022-12-17 ENCOUNTER — Telehealth: Payer: Self-pay | Admitting: Cardiovascular Disease

## 2022-12-17 DIAGNOSIS — E782 Mixed hyperlipidemia: Secondary | ICD-10-CM

## 2022-12-17 NOTE — Telephone Encounter (Signed)
Pt c/o medication issue:  1. Name of Medication: ezetimibe (ZETIA) 10 MG tablet   2. How are you currently taking this medication (dosage and times per day)?   3. Are you having a reaction (difficulty breathing--STAT)?   4. What is your medication issue? Pt states when she started this medication her joints were hurting and she stopped taking it. She states she is with a dietician.

## 2022-12-17 NOTE — Telephone Encounter (Signed)
Dr. Acie Fredrickson,  Hernandez saw this patient on 10/19/2022. Will you please comment on clearance for EGD/colonoscopy?  Please route your response to [facility]. I will communicate with requesting office once you have given recommendations.   Thank you!  Mayra Reel, NP

## 2022-12-17 NOTE — Telephone Encounter (Signed)
   Name: Holly [name]  DOB: December 05, 1960  MRN: [id number]   Primary Cardiologist: Mertie Moores, MD  Chart reviewed as part of pre-operative protocol coverage. [name] was last seen on 10/19/2022 by Dr. Acie Fredrickson. Per Dr. Acie Fredrickson: "Pt is at low risk for her colonoscopy / ECG . She may hold her Eliquis for 2 days ,  she will not need Lovenox bridging."   Therefore, based on ACC/AHA guidelines, the patient would be at acceptable risk for the planned procedure without further cardiovascular testing.   Per pharm D: Patient with diagnosis of atrial fibrillation on Eliquis for anticoagulation.     Procedure: endoscopy Date of procedure: TBD     CHA2DS2-VASc Score = 3   This indicates a 3.2% annual risk of stroke. The patient's score is based upon: CHF History: 0 HTN History: 0 Diabetes History: 1 Stroke History: 0 Vascular Disease History: 1 Age Score: 0 Gender Score: 1     CrCl 103 Platelet count 170   Per office protocol, patient can hold Eliquis for 2 days prior to procedure.   Patient will not need bridging with Lovenox (enoxaparin) around procedure.  I will route this recommendation to the requesting party via Epic fax function and remove from pre-op pool. Please call with questions.  Mayra Reel, NP 12/17/2022, 4:11 PM

## 2022-12-18 NOTE — Addendum Note (Signed)
Addended by: Lalani Winkles E on: 12/18/2022 02:10 PM   Modules accepted: Orders

## 2022-12-18 NOTE — Telephone Encounter (Signed)
Previously intolerant to rosuvastatin 5mg  daily and pravastatin 20mg  daily, now intolerant to ezetimibe as well. Can recheck lipids to get an updated baseline. If LDL is above goal < 70 (coronary CTA with mild CAD), would refer to lipid clinic to discuss other options. I have updated med and allergy list.

## 2022-12-18 NOTE — Telephone Encounter (Signed)
Called and spoke to patient who reports that she started taking zetia at the end of January and stopped it at the end of February due to joint swelling and muscle aches. She states that since she stopped taking it she has noticed improvement in swelling and muscle aches- "it's almost gone". She states she has been working with a nutritionist and is hoping she can improve her lipid levels with diet since she is unable to tolerate zetia or statins. Forwarded to Dr. Acie Fredrickson.

## 2022-12-24 NOTE — Telephone Encounter (Signed)
Called patient and discussed Dr. Elmarie Shiley recommendations, patient agrees to labs. Labs ordered and scheduled.

## 2022-12-24 NOTE — Addendum Note (Signed)
Addended by: Joni Reining on: 12/24/2022 09:45 AM   Modules accepted: Orders

## 2022-12-28 ENCOUNTER — Ambulatory Visit: Payer: BC Managed Care – PPO | Attending: Endocrinology

## 2022-12-28 DIAGNOSIS — E782 Mixed hyperlipidemia: Secondary | ICD-10-CM

## 2022-12-29 LAB — LIPID PANEL
Chol/HDL Ratio: 4.2 ratio (ref 0.0–4.4)
Cholesterol, Total: 175 mg/dL (ref 100–199)
HDL: 42 mg/dL (ref >39)
LDL Chol Calc (NIH): 115 mg/dL — ABNORMAL HIGH (ref 0–99)
Triglycerides: 95 mg/dL (ref 0–149)
VLDL Cholesterol Cal: 18 mg/dL (ref 5–40)

## 2022-12-29 LAB — ALT: ALT: 38 IU/L — ABNORMAL HIGH (ref 0–32)

## 2022-12-31 ENCOUNTER — Telehealth: Payer: Self-pay | Admitting: Cardiovascular Disease

## 2022-12-31 DIAGNOSIS — Z79899 Other long term (current) drug therapy: Secondary | ICD-10-CM

## 2022-12-31 DIAGNOSIS — E782 Mixed hyperlipidemia: Secondary | ICD-10-CM

## 2022-12-31 MED ORDER — NEXLETOL 180 MG PO TABS: 180.0000 mg | ORAL_TABLET | Freq: Every day | ORAL | 11 refills | Status: DC

## 2022-12-31 NOTE — Telephone Encounter (Signed)
-----   Message from Thayer Headings, MD sent at 12/30/2022  7:43 AM EDT ----- Coronary calcium score is 3.35 - 71st percentile for age / sex matched controls.  She is intolerant to statins and zetia Lets try Nexletol 180 mg a day ( a nonstatin lipid medication) Check lipids, ALT  in 3 months

## 2022-12-31 NOTE — Telephone Encounter (Signed)
Nexletol sent to [facility] on file. Labs entered and scheduled for 04/01/23. Patient has viewed physician comments via MyChart and message sent at this time as well to confirm she understands plan.

## 2023-01-08 ENCOUNTER — Ambulatory Visit: Payer: BC Managed Care – PPO | Admitting: Orthopaedic Surgery

## 2023-01-10 ENCOUNTER — Ambulatory Visit
Admission: RE | Admit: 2023-01-10 | Discharge: 2023-01-10 | Disposition: A | Discharge status: Home / Self Care | Payer: BC Managed Care – PPO | Source: Ambulatory Visit | Attending: Endocrinology | Admitting: Endocrinology

## 2023-01-10 DIAGNOSIS — I889 Nonspecific lymphadenitis, unspecified: Secondary | ICD-10-CM

## 2023-02-01 DIAGNOSIS — M545 Low back pain, unspecified: Secondary | ICD-10-CM | POA: Diagnosis not present

## 2023-02-01 DIAGNOSIS — M542 Cervicalgia: Secondary | ICD-10-CM | POA: Diagnosis not present

## 2023-02-01 DIAGNOSIS — G43719 Chronic migraine without aura, intractable, without status migrainosus: Secondary | ICD-10-CM | POA: Diagnosis not present

## 2023-02-01 DIAGNOSIS — M797 Fibromyalgia: Secondary | ICD-10-CM | POA: Diagnosis not present

## 2023-02-01 DIAGNOSIS — F419 Anxiety disorder, unspecified: Secondary | ICD-10-CM | POA: Diagnosis not present

## 2023-02-01 DIAGNOSIS — F32A Depression, unspecified: Secondary | ICD-10-CM | POA: Diagnosis not present

## 2023-02-21 DIAGNOSIS — G4733 Obstructive sleep apnea (adult) (pediatric): Secondary | ICD-10-CM | POA: Diagnosis not present

## 2023-02-22 ENCOUNTER — Emergency Department (HOSPITAL_COMMUNITY): Payer: BC Managed Care – PPO

## 2023-02-22 ENCOUNTER — Encounter (HOSPITAL_COMMUNITY): Payer: Self-pay | Admitting: Emergency Medicine

## 2023-02-22 ENCOUNTER — Emergency Department (HOSPITAL_COMMUNITY)
Admission: EM | Admit: 2023-02-22 | Discharge: 2023-02-22 | Disposition: A | Discharge status: Home / Self Care | Payer: BC Managed Care – PPO | Attending: Emergency Medicine | Admitting: Emergency Medicine

## 2023-02-22 ENCOUNTER — Other Ambulatory Visit: Payer: Self-pay

## 2023-02-22 DIAGNOSIS — M7989 Other specified soft tissue disorders: Secondary | ICD-10-CM | POA: Insufficient documentation

## 2023-02-22 DIAGNOSIS — M79604 Pain in right leg: Secondary | ICD-10-CM | POA: Diagnosis not present

## 2023-02-22 DIAGNOSIS — M79606 Pain in leg, unspecified: Secondary | ICD-10-CM

## 2023-02-22 DIAGNOSIS — Z7901 Long term (current) use of anticoagulants: Secondary | ICD-10-CM | POA: Insufficient documentation

## 2023-02-22 DIAGNOSIS — Z7984 Long term (current) use of oral hypoglycemic drugs: Secondary | ICD-10-CM | POA: Diagnosis not present

## 2023-02-22 DIAGNOSIS — M79651 Pain in right thigh: Secondary | ICD-10-CM | POA: Diagnosis not present

## 2023-02-22 MED ORDER — ACETAMINOPHEN 500 MG PO TABS
1000.0000 mg | ORAL_TABLET | Freq: Once | ORAL | Status: AC
  Administered 2023-02-22: 1000 mg via ORAL
  Filled 2023-02-22: qty 2

## 2023-02-22 NOTE — ED Provider Notes (Signed)
[place] EMERGENCY DEPARTMENT AT [facility] Provider Note   CSN: [id number] Arrival date & time: 02/22/23  1932     History Chief Complaint  Patient presents with   Leg Pain    R    Holly [name] is a 62 y.o. female.  Patient presents emergency department complaints of leg pain.  She reports that she was walking around a truck when she hit her leg against the bumper.  She is concerned about possible internal bleeding as she is currently on a blood thinner.  Patient able to ambulate and bear weight on the leg without significant difficulty.  She does report that the leg is become more painful and has noted some swelling to the area but no obvious bruising.  Denies any weakness or numbness in the affected limb.   Leg Pain      Home Medications Prior to Admission medications   Medication Sig Start Date End Date Taking? Authorizing Provider  acetaminophen (TYLENOL) 500 MG tablet Take 500 mg by mouth every 6 (six) hours as needed.    [provider]  apixaban (ELIQUIS) 5 MG TABS tablet Take 1 tablet (5 mg total) by mouth 2 (two) times daily. 07/13/22   Nahser, Deloris Ping, MD  Bempedoic Acid (NEXLETOL) 180 MG TABS Take 1 tablet (180 mg total) by mouth daily. 12/31/22   Nahser, Deloris Ping, MD  Buprenorphine HCl-Naloxone HCl 4-1 MG FILM Place 2 Film under the tongue 2 (two) times daily. 06/22/22   [provider]  conjugated estrogens (PREMARIN) 0.625 MG/GM vaginal cream Place vaginally. 12/06/14   [provider]  diclofenac Sodium (VOLTAREN) 1 % GEL APPLY 2 GRAMS TO AFFECTED AREA 4 TIMES A DAY 10/09/19   Kerrin Champagne, MD  DiphenhydrAMINE HCl (BENADRYL ALLERGY PO) Take by mouth as needed. Taken with advil for migraine management.    [provider]  estradiol (ESTRACE) 0.1 MG/GM vaginal cream PLACE A BLUEBERRY SIZED DROP OF CREAM INTO VAGINA AND AROUND URETHRA TWICE WEEKLY    [provider]  glucose blood test strip  01/10/15    [provider]  linaclotide (LINZESS) 145 MCG CAPS capsule TAKE 1 CAP BY MOUTH EVERY DAY AT LEAST 30 MINUTES BEFORE THE 1ST MAIN MEAL OF DAY ON EMPTY STOMACH    [provider]  LORazepam (ATIVAN) 2 MG tablet Take 2 mg by mouth 4 (four) times daily.    [provider]  metFORMIN (GLUCOPHAGE) 500 MG tablet Take 1,000 mg by mouth 2 (two) times daily with a meal.     [provider]  omeprazole (PRILOSEC) 40 MG capsule Take 40 mg by mouth every morning. 1 Capsule(s) By Mouth Every Morning    [provider]  OnabotulinumtoxinA (BOTOX IJ) Inject as directed every 3 (three) months. For migraine prevention.    [provider]  SYNTHROID 88 MCG tablet Take 88 mcg by mouth daily before breakfast. 07/16/22   [provider]      Allergies    Cefuroxime axetil, Effexor xr [venlafaxine hcl er], Sumatriptan, Erenumab-aooe, Garlic, Onion, Statins, Zetia [ezetimibe], Crestor [rosuvastatin calcium], and Triptans    Review of Systems   Review of Systems  Musculoskeletal:        Leg pain  All other systems reviewed and are negative.   Physical Exam Updated Vital Signs BP (!) 154/86   Pulse 87   Temp 97.9 F (36.6 C) (Oral)   Resp 16   Ht 5\' 5"  (1.651 m)  Wt 93.9 kg   SpO2 98%   BMI 34.45 kg/m  Physical Exam Vitals and nursing note reviewed.  Constitutional:      General: She is not in acute distress.    Appearance: She is well-developed.  HENT:     Head: Normocephalic and atraumatic.  Eyes:     Conjunctiva/sclera: Conjunctivae normal.  Cardiovascular:     Rate and Rhythm: Normal rate and regular rhythm.     Heart sounds: No murmur heard. Pulmonary:     Effort: Pulmonary effort is normal. No respiratory distress.     Breath sounds: Normal breath sounds.  Abdominal:     Palpations: Abdomen is soft.     Tenderness: There is no abdominal tenderness.  Musculoskeletal:        General: Swelling and tenderness present. No  deformity or signs of injury. Normal range of motion.     Cervical back: Neck supple.     Comments: Swelling and tenderness to the right thigh but no obvious or significant bruising.  Skin:    General: Skin is warm and dry.     Capillary Refill: Capillary refill takes less than 2 seconds.  Neurological:     Mental Status: She is alert.  Psychiatric:        Mood and Affect: Mood normal.     ED Results / Procedures / Treatments   Labs (all labs ordered are listed, but only abnormal results are displayed) Labs Reviewed - No data to display  EKG None  Radiology No results found.  Procedures Procedures   Medications Ordered in ED Medications  acetaminophen (TYLENOL) tablet 1,000 mg (1,000 mg Oral Given 02/22/23 2134)    ED Course/ Medical Decision Making/ A&P                           Medical Decision Making Amount and/or Complexity of Data Reviewed Radiology: ordered.  Risk OTC drugs.   This patient presents to the ED for concern of leg pain.  Differential diagnosis includes bruising, contusion, bone bruise, DVT, femur fracture    Imaging Studies ordered:  I ordered imaging studies including x-ray of right femur I independently visualized and interpreted imaging which showed x-ray results are pending I agree with the radiologist interpretation   Medicines ordered and prescription drug management:  I ordered medication including Tylenol for pain Reevaluation of the patient after these medicines showed that the patient improved I have reviewed the patients home medicines and have made adjustments as needed   Problem List / ED Course:  Patient presented to emergency department complaints of leg pain.  She reports that she ran into the back of a bumper of the vehicle and is now reporting some right thigh pain.  Has not noted any obvious bruising but has reported some swelling to the area.  Denies any difficulty ambulating but the pain has been worsening in this  area since earlier today.  Has not taken any medication prior to arriving in the emergency department.  Minimal concern at this time for any sort of vascular injury or a bony injury, the patient is insisting on having some evaluation done with imaging so x-ray of right femur ordered.  Dose of Tylenol given to patient for pain as she declines any stronger pain medications at this time.  Will reassess patient and reevaluate with results of femur x-ray.  9:58 PM Care of Brileigh Dacquisto transferred to Dr. Rhunette Croft at the end of my  shift as the patient will require reassessment once labs/imaging have resulted. Patient presentation, ED course, and plan of care discussed with review of all pertinent labs and imaging. Please see his/her note for further details regarding further ED course and disposition. Plan at time of handoff is await results of x-ray.  Patient likely safe for discharge home as I do not appreciate any abnormalities seen on x-ray imaging. This may be altered or completely changed at the discretion of the oncoming team pending results of further workup.   Final Clinical Impression(s) / ED Diagnoses Final diagnoses:  None    Rx / DC Orders ED Discharge Orders     None         Salomon Mast 02/22/23 2158    Pricilla Loveless, MD 02/25/23 1003

## 2023-02-22 NOTE — Discharge Instructions (Signed)
Xrays normal, no fracture. Apply ice to the affected area and take tylenol for pain control.

## 2023-02-22 NOTE — ED Triage Notes (Signed)
Pt c/o right upper thigh pain after running into a car bumper today. Pt sts concern as she takes blood thinners. No obvious deformity notable in triage. Ambulatory in triage.

## 2023-03-05 ENCOUNTER — Other Ambulatory Visit: Payer: Self-pay | Admitting: Internal Medicine

## 2023-03-05 ENCOUNTER — Ambulatory Visit
Admission: RE | Admit: 2023-03-05 | Discharge: 2023-03-05 | Disposition: A | Discharge status: Home / Self Care | Payer: BC Managed Care – PPO | Source: Ambulatory Visit | Attending: Internal Medicine | Admitting: Internal Medicine

## 2023-03-05 DIAGNOSIS — R091 Pleurisy: Secondary | ICD-10-CM

## 2023-04-01 ENCOUNTER — Ambulatory Visit: Payer: BC Managed Care – PPO | Attending: Cardiovascular Disease

## 2023-04-01 DIAGNOSIS — Z79899 Other long term (current) drug therapy: Secondary | ICD-10-CM

## 2023-04-01 DIAGNOSIS — E782 Mixed hyperlipidemia: Secondary | ICD-10-CM

## 2023-04-01 LAB — LIPID PANEL
Chol/HDL Ratio: 3.3 ratio (ref 0.0–4.4)
Cholesterol, Total: 164 mg/dL (ref 100–199)
HDL: 49 mg/dL (ref >39)
LDL Chol Calc (NIH): 104 mg/dL — ABNORMAL HIGH (ref 0–99)
Triglycerides: 55 mg/dL (ref 0–149)
VLDL Cholesterol Cal: 11 mg/dL (ref 5–40)

## 2023-04-01 LAB — ALT: ALT: 15 IU/L (ref 0–32)

## 2023-04-17 ENCOUNTER — Encounter: Payer: Self-pay | Admitting: Orthopedic Surgery

## 2023-04-17 ENCOUNTER — Ambulatory Visit: Payer: BC Managed Care – PPO | Admitting: Orthopedic Surgery

## 2023-04-17 ENCOUNTER — Other Ambulatory Visit (INDEPENDENT_AMBULATORY_CARE_PROVIDER_SITE_OTHER): Payer: Medicare Other

## 2023-04-17 VITALS — BP 122/74 | HR 70 | Ht 65.0 in | Wt 207.0 lb

## 2023-04-17 DIAGNOSIS — M545 Low back pain, unspecified: Secondary | ICD-10-CM | POA: Diagnosis not present

## 2023-04-17 DIAGNOSIS — M2578 Osteophyte, vertebrae: Secondary | ICD-10-CM

## 2023-04-17 NOTE — Progress Notes (Signed)
Orthopedic Spine Surgery Office Note  Assessment: Patient is a 62 y.o. female with bilateral hip and bilateral shoulder pain.  It started about 5 to 6 weeks ago with no trauma given the constellation of symptoms and timing, it is possible that she has polymyalgia rheumatica   Plan: -Ordered ESR/CRP to be done today to workup PMR -Patient has tried Tylenol and activity modification -Recommended continue with the Tylenol -If ESR and CRP are normal, that would point against PMR and we can workup her more significant right hip pain -Patient should return to office in 3 weeks, x-rays at next visit: None   Patient expressed understanding of the plan and all questions were answered to the patient's satisfaction.   ___________________________________________________________________________   History:  Patient is a 62 y.o. female who presents today for bilateral hip pain.  Patient states that she has had 5 to 6 weeks of bilateral hip pain.  She states that it is worse in the morning.  She describes a heaviness in her hips.  Pain is felt both on the anterior and posterior aspect of the hips.  The right hip hurts more than the left.  She has a chronic history of right shoulder pain.  She has noticed left shoulder pain within the last 5 to 6 weeks as well.  She says that this is worse in the morning as well.  She has been trying Tylenol but that has not provided her with significant relief.  She does get some relief with sitting.  Pain does not radiate down past the hips.  There is no trauma or injury that preceded the onset of pain.   Weakness: Denies Symptoms of imbalance: Denies Paresthesias and numbness: Denies Bowel or bladder incontinence: Denies Saddle anesthesia: Denies  Treatments tried: Tylenol, activity modification  Review of systems: Denies fevers and chills, night sweats, unexplained weight loss, history of cancer.  Has had pain that wakes her at night  Past medical  history: Migraines Anxiety/depression Fibromyalgia GERD Atrial fibrillation Diabetes Sleep apnea Irritable bowel syndrome Hypothyroidism Diverticulosis  Allergies: Cefuroxime, Effexor, erenumab, statins, Zetia, Crestor, triptans  Past surgical history:  Bilateral carpal tunnel release Trigger finger releases Tonsillectomy Thyroidectomy Cholecystectomy  Social history: Denies use of nicotine product (smoking, vaping, patches, smokeless) Alcohol use: denies Denies recreational drug use   Physical Exam:  BMI 34.5  General: no acute distress, appears stated age Neurologic: alert, answering questions appropriately, following commands Respiratory: unlabored breathing on room air, symmetric chest rise Psychiatric: appropriate affect, normal cadence to speech   MSK (spine):  -Strength exam      Left  Right EHL    5/5  5/5 TA    5/5  5/5 GSC    5/5  5/5 Knee extension  5/5  5/5 Hip flexion   5/5  5/5  -Sensory exam    Sensation intact to light touch in L3-S1 nerve distributions of bilateral lower extremities  -Achilles DTR: 2/4 on the left, 2/4 on the right -Patellar tendon DTR: 2/4 on the left, 2/4 on the right  -Straight leg raise: Negative bilaterally -Femoral nerve stretch test: Negative bilaterally -Clonus: no beats bilaterally  -Left hip exam: Positive FADIR, positive FABER, negative SI joint compression test, negative FABER, negative Stinchfield -Right hip exam: Positive FADIR, positive FABER, positive SI joint compression test, positive Pearlean Brownie, negative Stinchfield  Imaging: XR of the lumbar spine from 04/17/2023 was independently reviewed and interpreted, showing small anterior osteophyte formation at L4/5.  No other significant degenerative changes.  No  evidence of instability on flexion/extension views.  No fracture or dislocation seen.   Patient name: Holly [name] Peabody Patient MRN: [id number] Date of visit: 04/17/23

## 2023-04-20 LAB — C-REACTIVE PROTEIN: CRP: 7.8 mg/L (ref <8.0)

## 2023-04-20 LAB — SEDIMENTATION RATE: Sed Rate: 6 mm/h (ref 0–30)

## 2023-04-21 ENCOUNTER — Encounter: Payer: Self-pay | Admitting: Cardiovascular Disease

## 2023-04-21 NOTE — Progress Notes (Signed)
  No show, cancelled  This encounter was created in error - please disregard.

## 2023-04-22 ENCOUNTER — Ambulatory Visit: Payer: BC Managed Care – PPO | Admitting: Cardiovascular Disease

## 2023-04-23 ENCOUNTER — Telehealth: Payer: Self-pay | Admitting: Orthopedic Surgery

## 2023-05-02 ENCOUNTER — Other Ambulatory Visit: Payer: Self-pay | Admitting: Endocrinology

## 2023-05-02 DIAGNOSIS — R591 Generalized enlarged lymph nodes: Secondary | ICD-10-CM

## 2023-05-13 ENCOUNTER — Ambulatory Visit
Admission: RE | Admit: 2023-05-13 | Discharge: 2023-05-13 | Disposition: A | Discharge status: Home / Self Care | Payer: BC Managed Care – PPO | Source: Ambulatory Visit | Attending: Endocrinology | Admitting: Endocrinology

## 2023-05-13 DIAGNOSIS — R591 Generalized enlarged lymph nodes: Secondary | ICD-10-CM

## 2023-05-15 ENCOUNTER — Ambulatory Visit: Payer: BC Managed Care – PPO | Admitting: Orthopedic Surgery

## 2023-05-29 ENCOUNTER — Encounter: Payer: Self-pay | Admitting: Cardiovascular Disease

## 2023-05-29 NOTE — Progress Notes (Unsigned)
Cardiology Office Note:    Date:  05/30/2023   ID:  Holly [name], DOB 1961/07/21, MRN [id number]  PCP:  Holly Aspen, MD   Holly [name]:  Holly [name]     Referring MD: Holly [name], *   Chief Complaint  Patient presents with   Hyperlipidemia    History of Present Illness:    Holly [name] is a 62 y.o. female with a hx of shortness of breath, ,DM, obesity , hypothyroidism . She recently has had some arrhythmias and a Kardia mobile showed some atrial fib   She recorded an eipsode of PAF detected by her Lourena Simmonds monitor on Feb 11, 2022 at 10:13 PM Occurred a month after she had lowered her levothyroxine  CHADS2VASC is 2 ( female, DM)  I recommended that we start Eliquis.  She told me that she has hemorrhoids that bleed frequently and is scared that she will have excessive bleeding.  Of asked her to see a surgeon to see if there is any alternative treatment  Has OSA , wears her CPAP consistently .   Gets good readings on her CPAP feedback .  Does not get exercise  Has been getting winded with any exercise    Yesterday was playing volleyball,  Was sweating and had severe DOE  Has chest pain with exertion  Might last a minute or so  Radiates through to intrascapular region  Associated with dyspnea  Is frequently fatigued   + family hx of CAD Father had carotid art disease in 51s,  then Afib in 45s Maternal grandmother had CAD  Both grandfathers   Has been hyperthyoidism Synthroid was reduced  6 months ago from .112 to 0.100 mg   8 days ago her TSH was 0.110  . Dr. Orson [name] wanted to lower her synthroid to 0.088 mg  I've agreed with dr. Orson [name] that she should lower her dose   She needs to see endocrinology - Dr. Talmage [name] is who she saw previously   Jan. 19, 2024  Holly [name] is seen for follow up of her PAF,  OSA DM She was actively hyperthyroid ( on Synthroid .100 mg a day .   I recommended lowering  her dose to 88 mcg a day   Event monitor ( to evaluate her for PAF)  showed sinus rhythm, rare PVCs , NO atrial fib.  I have reviewed the Kardia event again. She definitely had paroxysmal atrial fib   Tolerates the eliquis except she has some dizzyness with standing .  Occasionally has some bleeding  from her hemmorhoids  Will check CBC today   CHADS2VASC is 3 ( female, CAD, DM )   Coronary CTA showed CAC of 3.35 ( all in prox RCA ) which is 71st percentile of age / sex matched controls    RCA with mild CAD  Echo on Oct. 26 showed normal LV and RV function ,  trivial MR  No structural abnormalities to explain he symptoms  LM - normal  LAD - normal  LCx - normal   She continues to have CP with radiation to the intrascapular region. Saw her chiropractor , was adjusted and felt better    Has tried statins  - did not tolerate  Will repeat lipids, LP(a) ,  will have a low threshold to refer her to the lipid clinic for consideration of PCSK9 inhibitor   Check CBC today  As long as her hemoglobin stays fairly stable, I think will be safe  to continue Eliquis.  If her hemoglobin drops, will need to reconsider and possibly refer her for Watchman procedure.  Aug. 29, 20224  Holly [name] is seen for follow up of her CAD , Has normal LV and RV function Under lots of stress  Has lost weight  Wt is 190 lbs ( down 18 lbs from [place], 2024)   Walking regularly , no CP   She thinks lots of her syptoms were due to her thyoid   She needs to have an EGD.  She is at low risk for her EGD.  She may hold her eliquis for 2 days prior to her procedure  Restart eliquis when her GI doctor gives the OK   Will likely transition her and her husband Holly [name] to Dr. Cristal [name] upon my retirement .    Past Medical History:  Diagnosis Date   Anxiety    takes Ativan daily   Back pain    stenosis and buldging disc   Bone spur    neck and buldging disc   Depression    takes Cymbalta daily   Diabetes (HCC)     takes Metformin daily   Diverticulosis    GERD (gastroesophageal reflux disease)    takes Prevacid daily   History of bronchitis    > 5 yrs ago   History of hiatal hernia    Hypothyroidism    takes Synthroid daily   IBS (irritable bowel syndrome)    Insomnia    doesn't take any meds   Joint pain    Lung nodule    right middle lobe-was being followed by [facility].Medical Md is keeping up with this   Migraine    Migraine    last one 08/28/15   Nocturia    Numbness and tingling in hands    PONV (postoperative nausea and vomiting)    Sleep apnea    Thoracic outlet syndrome    TOS (thoracic outlet syndrome)    Urinary urgency     Past Surgical History:  Procedure Laterality Date   ABDOMINAL EXPLORATION SURGERY     cancer cells in cervix   CARPAL TUNNEL RELEASE Right 09/05/2015   Procedure: RIGHT OPEN CARPAL TUNNEL RELEASE, RIGHT LONG FINGER TRIGGER RELEASE,RIGHT INDEX FINGER TRIGGER RELEASE;  Surgeon: Holly Champagne, MD;  Location: [facility] OR;  Service: Orthopedics;  Laterality: Right;   CARPAL TUNNEL RELEASE Left 08/11/2019   Procedure: LEFT OPEN CARPAL TUNNEL RELEASE AND LEFT LONG FINGER TRIGGER FINGER RELEASE;  Surgeon: Holly Champagne, MD;  Location: [facility];  Service: Orthopedics;  Laterality: Left;   CHOLECYSTECTOMY     COLONOSCOPY     ESOPHAGOGASTRODUODENOSCOPY     THYROIDECTOMY     TONSILLECTOMY     adenoidectomy   TRIGGER FINGER RELEASE Right 09/05/2015   Procedure: RELEASE TRIGGER FINGER/A-1 PULLEY;  Surgeon: Holly Champagne, MD;  Location: [facility];  Service: Orthopedics;  Laterality: Right;   TRIGGER FINGER RELEASE Left 08/11/2019   Procedure: RELEASE TRIGGER FINGER/A-1 PULLEY;  Surgeon: Holly Champagne, MD;  Location: [facility];  Service: Orthopedics;  Laterality: Left;    Current Medications: Current Meds  Medication Sig   acetaminophen (TYLENOL) 500 MG tablet Take 500 mg by mouth every 6 (six) hours as needed.   apixaban (ELIQUIS) 5  MG TABS tablet Take 1 tablet (5 mg total) by mouth 2 (two) times daily.   Buprenorphine HCl-Naloxone HCl 4-1 MG FILM Place 2 Film under the tongue 2 (two) times daily.  conjugated estrogens (PREMARIN) 0.625 MG/GM vaginal cream Place vaginally.   diclofenac Sodium (VOLTAREN) 1 % GEL APPLY 2 GRAMS TO AFFECTED AREA 4 TIMES A DAY   DiphenhydrAMINE HCl (BENADRYL ALLERGY PO) Take by mouth as needed. Taken with advil for migraine management.   estradiol (ESTRACE) 0.1 MG/GM vaginal cream PLACE A BLUEBERRY SIZED DROP OF CREAM INTO VAGINA AND AROUND URETHRA TWICE WEEKLY   glucose blood test strip    linaclotide (LINZESS) 145 MCG CAPS capsule TAKE 1 CAP BY MOUTH EVERY DAY AT LEAST 30 MINUTES BEFORE THE 1ST MAIN MEAL OF DAY ON EMPTY STOMACH   LORazepam (ATIVAN) 2 MG tablet Take 2 mg by mouth 4 (four) times daily.   metFORMIN (GLUCOPHAGE) 500 MG tablet Take 1,000 mg by mouth 2 (two) times daily with a meal.    omeprazole (PRILOSEC) 40 MG capsule Take 40 mg by mouth every morning. 1 Capsule(s) By Mouth Every Morning   OnabotulinumtoxinA (BOTOX IJ) Inject as directed every 3 (three) months. For migraine prevention.   SYNTHROID 88 MCG tablet Take 88 mcg by mouth daily before breakfast.     Allergies:   Cefuroxime axetil, Effexor xr [venlafaxine hcl er], Sumatriptan, Erenumab-aooe, Garlic, Onion, Statins, Zetia [ezetimibe], Crestor [rosuvastatin calcium], and Triptans   Social History   Socioeconomic History   Marital status: Married    Spouse name: Not on file   Number of children: Not on file   Years of education: Not on file   Highest education level: Not on file  Occupational History   Not on file  Tobacco Use   Smoking status: Former    Types: Cigarettes   Smokeless tobacco: Never  Substance and Sexual Activity   Alcohol use: No   Drug use: No   Sexual activity: Yes    Birth control/protection: Post-menopausal  Other Topics Concern   Not on file  Social History Narrative   Not on file    Social Determinants of Health   Financial Resource Strain: Not on file  Food Insecurity: Not on file  Transportation Needs: Not on file  Physical Activity: Not on file  Stress: Not on file  Social Connections: Not on file     Family History: The patient's family history includes COPD in her mother; Cancer in her mother; Heart disease in her maternal grandfather, maternal grandmother, paternal grandfather, and paternal grandmother.  ROS:   Please see the history of present illness.     All other systems reviewed and are negative.  EKGs/Labs/Other Studies Reviewed:    The following studies were reviewed today:   EKG:    EKG Interpretation Date/Time:  Thursday May 30 2023 14:19:47 EDT Ventricular Rate:  63 PR Interval:  138 QRS Duration:  76 QT Interval:  408 QTC Calculation: 417 R Axis:   36  Text Interpretation: Normal sinus rhythm Normal ECG When compared with ECG of 07-Aug-2019 12:00, No significant change was found Confirmed by Kristeen Miss ([id number]) on 05/30/2023 2:46:25 PM     Recent Labs: 10/19/2022: BUN 11; Creatinine, Ser 0.88; Hemoglobin 14.3; Platelets 170; Potassium 4.0; Sodium 138 04/01/2023: ALT 15  Recent Lipid Panel    Component Value Date/Time   CHOL 164 04/01/2023 1122   TRIG 55 04/01/2023 1122   HDL 49 04/01/2023 1122   CHOLHDL 3.3 04/01/2023 1122   CHOLHDL 7.0 05/29/2007 0505   VLDL 79 (H) 05/29/2007 0505   LDLCALC 104 (H) 04/01/2023 1122     Risk Assessment/Calculations:    CHA2DS2-VASc Score = 3  This indicates a 3.2% annual risk of stroke. The patient's score is based upon: CHF History: 0 HTN History: 0 Diabetes History: 1 Stroke History: 0 Vascular Disease History: 1 Age Score: 0 Gender Score: 1           Physical Exam:   Physical Exam: Blood pressure 112/68, pulse 85, height 5\' 5"  (1.651 m), weight 190 lb (86.2 kg), SpO2 96%.       GEN:  Well nourished, well developed in no acute distress HEENT: Normal NECK: No  JVD; No carotid bruits LYMPHATICS: No lymphadenopathy CARDIAC: RRR , no murmurs, rubs, gallops RESPIRATORY:  Clear to auscultation without rales, wheezing or rhonchi  ABDOMEN: Soft, non-tender, non-distended MUSCULOSKELETAL:  No edema; No deformity  SKIN: Warm and dry NEUROLOGIC:  Alert and oriented x 3    ASSESSMENT:    1. Mixed hyperlipidemia      PLAN:      Paroxysmal atrial fibrillation:  she had an episode of PAF documented on her Kardia mobile.   This occurred when she was on a higher dose of synthroid 30 day event monitor did not show any evidence of Afib .  We have discussed the fact that her Afib will likely worsen with age.    At present her CHA2DS2-VASc score is  3  She thinks that her PAF was partly caused by her over supplementation of her Synthroid.  I agree that this is probably a contributor but I do not think that we should discontinue the Eliquis at this point.  She is not having any GI bleeding issues.  I think for now we will continue with the Eliquis to make sure that she is not at risk for stroke.  Will have her follow-up with [place] in 1 year.  Will likely transition her to Dr. Cristal [name] upon my retirement next year.       2.   Chest pain:    coronary CTA showed mild CAD .  + coronary calcium in the RCA . Cont lipid management Check LP(a)          Medication Adjustments/Labs and Tests Ordered: Current medicines are reviewed at length with the patient today.  Concerns regarding medicines are outlined above.  Orders Placed This Encounter  Procedures   EKG 12-Lead   No orders of the defined types were placed in this encounter.   Patient Instructions  Medication Instructions:   Your physician recommends that you continue on your current medications as directed. Please refer to the Current Medication list given to you today.  *If you need a refill on your cardiac medications before your next appointment, please call your  pharmacy*     Follow-Up: At [facility], you and your health needs are our priority.  As part of our continuing mission to provide you with exceptional heart care, we have created designated Provider Care Teams.  These Care Teams include your primary [name] (physician) and Advanced Practice Providers (APPs -  Physician Assistants and Nurse Practitioners) who all work together to provide you with the care you need, when you need it.  We recommend signing up for the patient portal called "MyChart".  Sign up information is provided on this After Visit Summary.  MyChart is used to connect with patients for Virtual Visits (Telemedicine).  Patients are able to view lab/test results, encounter notes, upcoming appointments, etc.  Non-urgent messages can be sent to your provider as well.   To learn more about what you can do with MyChart,  go to [email or web address].    Your next appointment:   1 year(s)  Provider:   Kristeen Miss, MD         Signed, Kristeen Miss, MD  05/30/2023 2:46 PM    [facility]

## 2023-05-30 ENCOUNTER — Encounter: Payer: Self-pay | Admitting: Cardiovascular Disease

## 2023-05-30 ENCOUNTER — Ambulatory Visit: Payer: BC Managed Care – PPO | Attending: Cardiovascular Disease | Admitting: Cardiovascular Disease

## 2023-05-30 VITALS — BP 112/68 | HR 85 | Ht 65.0 in | Wt 190.0 lb

## 2023-05-30 DIAGNOSIS — E782 Mixed hyperlipidemia: Secondary | ICD-10-CM | POA: Diagnosis not present

## 2023-05-30 NOTE — Patient Instructions (Addendum)
Medication Instructions:   Your physician recommends that you continue on your current medications as directed. Please refer to the Current Medication list given to you today.  *If you need a refill on your cardiac medications before your next appointment, please call your pharmacy*     Follow-Up: At [facility], you and your health needs are our priority.  As part of our continuing mission to provide you with exceptional heart care, we have created designated Provider Care Teams.  These Care Teams include your primary Cardiologist (physician) and Advanced Practice Providers (APPs -  Physician Assistants and Nurse Practitioners) who all work together to provide you with the care you need, when you need it.  We recommend signing up for the patient portal called "MyChart".  Sign up information is provided on this After Visit Summary.  MyChart is used to connect with patients for Virtual Visits (Telemedicine).  Patients are able to view lab/test results, encounter notes, upcoming appointments, etc.  Non-urgent messages can be sent to your provider as well.   To learn more about what you can do with MyChart, go to [email or web address].    Your next appointment:   1 year(s)  Dr. Jodelle Red at our [facility] office

## 2023-06-12 ENCOUNTER — Ambulatory Visit: Payer: BC Managed Care – PPO | Admitting: Orthopedic Surgery

## 2023-06-17 ENCOUNTER — Telehealth: Payer: Self-pay

## 2023-06-17 NOTE — Telephone Encounter (Signed)
Pre-operative Risk Assessment    Patient Name: Holly [name]  DOB: Jan 07, 1961 MRN: [id number]   Last OV 05/30/23 with Dr. Elease Hashimoto   Request for Surgical Clearance    Procedure:   Colonoscopy  Date of Surgery:  Clearance 07/11/23                                 Surgeon:  Dr. Charlott Rakes Surgeon's Group or Practice Name:  [facility] Phone number:  [phone] Fax number:  [phone]   Type of Clearance Requested:   - Medical  - Pharmacy:  Hold Apixaban (Eliquis) pt will need instructions on when/if to hold   Type of Anesthesia:   Propofol   Additional requests/questions:    Signed, Zada Finders   06/17/2023, 1:10 PM

## 2023-06-17 NOTE — Telephone Encounter (Signed)
Patient with diagnosis of atrial fibrillation on Eliuqis for anticoagulation.    Procedure:   Colonoscopy   Date of Surgery:  Clearance 07/11/23   CHA2DS2-VASc Score = 3   This indicates a 3.2% annual risk of stroke. The patient's score is based upon: CHF History: 0 HTN History: 0 Diabetes History: 1 Stroke History: 0 Vascular Disease History: 1 Age Score: 0 Gender Score: 1  CrCl 94 Platelet count 170  Per office protocol, patient can hold Eliquis for 2 days prior to procedure.   Patient will not need bridging with Lovenox (enoxaparin) around procedure.  **This guidance is not considered finalized until pre-operative APP has relayed final recommendations.**

## 2023-06-17 NOTE — Telephone Encounter (Signed)
Patient Name: Holly [name]  DOB: 07/21/1961 MRN: [id number]  Primary Cardiologist: Kristeen Miss, MD  Chart reviewed as part of pre-operative protocol coverage. Pre-op clearance already addressed by colleagues in earlier phone notes. To summarize recommendations:  -She is at low risk for her colonoscopy.  She may hold her eliquis for 2 days prior to her procedure  Restart eliquis when her GI doctor gives the OK  -Dr. Elease Hashimoto  Will route this bundled recommendation to requesting provider via Epic fax function and remove from pre-op pool. Please call with questions.  Sharlene Dory, PA-C 06/17/2023, 3:04 PM

## 2023-06-24 ENCOUNTER — Telehealth: Payer: Self-pay | Admitting: Pulmonary Disease

## 2023-06-24 NOTE — Telephone Encounter (Signed)
Spoke with patient who inquired about due date of next CT chest.  Patient was referred to pulmonary in 2023 for review of lung nodules noted on a coronary CT.  Dr. Tonia Brooms consulted with patient in December 2023 and ordered a CT chest wo and results were reviewed by phone with no future orders placed.  The patient believes she is to have another CT chest this year and would like it done before year end, due to out of pocket costs.  Advised no new orders have been placed and will have Dr. Tonia Brooms review and make recommendations and then update the patient.  Routed to Dr. Tonia Brooms for review/recommendations if CT Chest is due.

## 2023-06-25 ENCOUNTER — Ambulatory Visit: Payer: BC Managed Care – PPO | Admitting: Acute Care

## 2023-06-25 ENCOUNTER — Other Ambulatory Visit: Payer: Self-pay

## 2023-06-25 DIAGNOSIS — R918 Other nonspecific abnormal finding of lung field: Secondary | ICD-10-CM

## 2023-06-25 NOTE — Telephone Encounter (Signed)
Order has been placed.

## 2023-06-26 ENCOUNTER — Ambulatory Visit: Payer: BC Managed Care – PPO | Admitting: Acute Care

## 2023-06-26 DIAGNOSIS — K080 Exfoliation of teeth due to systemic causes: Secondary | ICD-10-CM | POA: Diagnosis not present

## 2023-07-01 DIAGNOSIS — H04123 Dry eye syndrome of bilateral lacrimal glands: Secondary | ICD-10-CM | POA: Diagnosis not present

## 2023-07-01 DIAGNOSIS — H25813 Combined forms of age-related cataract, bilateral: Secondary | ICD-10-CM | POA: Diagnosis not present

## 2023-07-01 DIAGNOSIS — E119 Type 2 diabetes mellitus without complications: Secondary | ICD-10-CM | POA: Diagnosis not present

## 2023-07-24 DIAGNOSIS — G4733 Obstructive sleep apnea (adult) (pediatric): Secondary | ICD-10-CM | POA: Diagnosis not present

## 2023-08-02 DIAGNOSIS — M542 Cervicalgia: Secondary | ICD-10-CM | POA: Diagnosis not present

## 2023-08-02 DIAGNOSIS — F419 Anxiety disorder, unspecified: Secondary | ICD-10-CM | POA: Diagnosis not present

## 2023-08-02 DIAGNOSIS — M797 Fibromyalgia: Secondary | ICD-10-CM | POA: Diagnosis not present

## 2023-08-02 DIAGNOSIS — G43719 Chronic migraine without aura, intractable, without status migrainosus: Secondary | ICD-10-CM | POA: Diagnosis not present

## 2023-08-05 ENCOUNTER — Other Ambulatory Visit: Payer: Self-pay | Admitting: Cardiovascular Disease

## 2023-08-05 DIAGNOSIS — I480 Paroxysmal atrial fibrillation: Secondary | ICD-10-CM

## 2023-08-05 DIAGNOSIS — I200 Unstable angina: Secondary | ICD-10-CM

## 2023-08-05 DIAGNOSIS — R9431 Abnormal electrocardiogram [ECG] [EKG]: Secondary | ICD-10-CM

## 2023-08-05 NOTE — Telephone Encounter (Signed)
Prescription refill request for Eliquis received. Indication:afib Last office visit:8/24 Scr:0.88  1/24 Age: 62 Weight:86.2  kg  Prescription refilled

## 2023-09-19 ENCOUNTER — Ambulatory Visit
Admission: RE | Admit: 2023-09-19 | Discharge: 2023-09-19 | Disposition: A | Discharge status: Home / Self Care | Payer: BC Managed Care – PPO | Source: Ambulatory Visit | Attending: Pulmonary Disease | Admitting: Pulmonary Disease

## 2023-09-19 ENCOUNTER — Other Ambulatory Visit: Payer: Self-pay | Admitting: Endocrinology

## 2023-09-19 ENCOUNTER — Other Ambulatory Visit: Payer: BC Managed Care – PPO

## 2023-09-19 DIAGNOSIS — R591 Generalized enlarged lymph nodes: Secondary | ICD-10-CM

## 2023-09-19 DIAGNOSIS — R918 Other nonspecific abnormal finding of lung field: Secondary | ICD-10-CM

## 2023-10-03 ENCOUNTER — Telehealth: Payer: Self-pay | Admitting: Orthopedic Surgery

## 2023-10-03 NOTE — Telephone Encounter (Signed)
 I called and spoke with [facility], I advised that they would need to all the lab and they would send over a form for us  to add Dx codes for the labs. Once I look at the chart and add the codes I will send it back to [facility], they state that they understand this and that they will call them.

## 2023-10-03 NOTE — Telephone Encounter (Signed)
 Pt's husband Jonny Ruiz called requesting a call from Dr. Christell Constant. He states he need a call back concerning coding that was sent in for appt pt had and insurance company states Dr Christell Constant need to change for them to pay. Please call pt at [phone].

## 2023-10-04 ENCOUNTER — Encounter (INDEPENDENT_AMBULATORY_CARE_PROVIDER_SITE_OTHER): Payer: BC Managed Care – PPO | Admitting: Ophthalmology

## 2023-10-07 ENCOUNTER — Telehealth: Payer: Self-pay | Admitting: Pulmonary Disease

## 2023-10-07 ENCOUNTER — Ambulatory Visit: Payer: BC Managed Care – PPO | Admitting: Pulmonary Disease

## 2023-10-07 NOTE — Telephone Encounter (Signed)
 Patient would like results of CT scan. Patient phone number is [phone].

## 2023-10-08 ENCOUNTER — Ambulatory Visit
Admission: RE | Admit: 2023-10-08 | Discharge: 2023-10-08 | Disposition: A | Discharge status: Home / Self Care | Payer: Self-pay | Source: Ambulatory Visit | Attending: Endocrinology | Admitting: Endocrinology

## 2023-10-08 DIAGNOSIS — Z9889 Other specified postprocedural states: Secondary | ICD-10-CM | POA: Diagnosis not present

## 2023-10-08 DIAGNOSIS — R591 Generalized enlarged lymph nodes: Secondary | ICD-10-CM

## 2023-10-11 ENCOUNTER — Encounter (INDEPENDENT_AMBULATORY_CARE_PROVIDER_SITE_OTHER): Payer: 59 | Admitting: Ophthalmology

## 2023-10-11 ENCOUNTER — Encounter (INDEPENDENT_AMBULATORY_CARE_PROVIDER_SITE_OTHER): Payer: Medicare Other | Admitting: Ophthalmology

## 2023-10-11 DIAGNOSIS — E113293 Type 2 diabetes mellitus with mild nonproliferative diabetic retinopathy without macular edema, bilateral: Secondary | ICD-10-CM

## 2023-10-11 DIAGNOSIS — H43813 Vitreous degeneration, bilateral: Secondary | ICD-10-CM | POA: Diagnosis not present

## 2023-10-11 DIAGNOSIS — Z7984 Long term (current) use of oral hypoglycemic drugs: Secondary | ICD-10-CM

## 2023-10-17 DIAGNOSIS — E890 Postprocedural hypothyroidism: Secondary | ICD-10-CM | POA: Diagnosis not present

## 2023-10-17 DIAGNOSIS — E1165 Type 2 diabetes mellitus with hyperglycemia: Secondary | ICD-10-CM | POA: Diagnosis not present

## 2023-10-17 DIAGNOSIS — R591 Generalized enlarged lymph nodes: Secondary | ICD-10-CM | POA: Diagnosis not present

## 2023-10-17 DIAGNOSIS — E7800 Pure hypercholesterolemia, unspecified: Secondary | ICD-10-CM | POA: Diagnosis not present

## 2023-10-22 NOTE — Telephone Encounter (Signed)
Pt needs appt to discuss her CT- routing to front desk pool to make appt. She is currently still on [facility]'s schedule.

## 2023-10-23 DIAGNOSIS — N950 Postmenopausal bleeding: Secondary | ICD-10-CM | POA: Diagnosis not present

## 2023-10-23 DIAGNOSIS — N3946 Mixed incontinence: Secondary | ICD-10-CM | POA: Diagnosis not present

## 2023-10-25 DIAGNOSIS — E119 Type 2 diabetes mellitus without complications: Secondary | ICD-10-CM | POA: Diagnosis not present

## 2023-10-25 DIAGNOSIS — E039 Hypothyroidism, unspecified: Secondary | ICD-10-CM | POA: Diagnosis not present

## 2023-10-25 DIAGNOSIS — G43719 Chronic migraine without aura, intractable, without status migrainosus: Secondary | ICD-10-CM | POA: Diagnosis not present

## 2023-10-25 DIAGNOSIS — M542 Cervicalgia: Secondary | ICD-10-CM | POA: Diagnosis not present

## 2023-10-25 DIAGNOSIS — F419 Anxiety disorder, unspecified: Secondary | ICD-10-CM | POA: Diagnosis not present

## 2023-10-25 DIAGNOSIS — M797 Fibromyalgia: Secondary | ICD-10-CM | POA: Diagnosis not present

## 2023-10-25 DIAGNOSIS — F32A Depression, unspecified: Secondary | ICD-10-CM | POA: Diagnosis not present

## 2023-11-07 ENCOUNTER — Other Ambulatory Visit: Payer: Self-pay | Admitting: Gastroenterology

## 2023-11-07 DIAGNOSIS — K760 Fatty (change of) liver, not elsewhere classified: Secondary | ICD-10-CM

## 2023-11-08 DIAGNOSIS — G4733 Obstructive sleep apnea (adult) (pediatric): Secondary | ICD-10-CM | POA: Diagnosis not present

## 2023-11-11 ENCOUNTER — Ambulatory Visit: Payer: BC Managed Care – PPO | Admitting: Pulmonary Disease

## 2023-11-12 ENCOUNTER — Other Ambulatory Visit: Payer: Medicare Other

## 2023-11-12 ENCOUNTER — Telehealth: Payer: Self-pay | Admitting: Orthopedic Surgery

## 2023-11-12 NOTE — Telephone Encounter (Signed)
Pt's husband called about an diagnosis code change for billing of insurance to pay. Pt's husband Jonny Ruiz states Dr Christell Constant seen pt back 04/17/23 and put in wrong diagnosis code and insurance will not pay until Dr Christell Constant changes it. Jonny Ruiz is asking for a call from [place] at [phone].

## 2023-11-12 NOTE — Telephone Encounter (Signed)
I called and spoke with Holly [name], advised that I spent 45 mins on the phone with [facility] to get the form that I needed and that I rec'd it and it has been faxed back to them and that it will take a few days to get it processed.

## 2023-11-12 NOTE — Telephone Encounter (Signed)
I just spoke with the [facility]-they no longer send out these letters requesting further dx, however she is faxing me the letter to complete it and I will fax it back to them

## 2023-11-14 ENCOUNTER — Ambulatory Visit
Admission: RE | Admit: 2023-11-14 | Discharge: 2023-11-14 | Disposition: A | Discharge status: Home / Self Care | Payer: Medicare Other | Source: Ambulatory Visit | Attending: Gastroenterology | Admitting: Gastroenterology

## 2023-11-14 DIAGNOSIS — K769 Liver disease, unspecified: Secondary | ICD-10-CM | POA: Diagnosis not present

## 2023-11-14 DIAGNOSIS — K760 Fatty (change of) liver, not elsewhere classified: Secondary | ICD-10-CM

## 2023-11-15 ENCOUNTER — Telehealth: Payer: Self-pay

## 2023-11-18 ENCOUNTER — Ambulatory Visit (INDEPENDENT_AMBULATORY_CARE_PROVIDER_SITE_OTHER): Payer: 59 | Admitting: Acute Care

## 2023-11-18 ENCOUNTER — Encounter: Payer: Self-pay | Admitting: Acute Care

## 2023-11-18 VITALS — BP 128/75 | HR 86 | Ht 65.0 in | Wt 206.4 lb

## 2023-11-18 DIAGNOSIS — R918 Other nonspecific abnormal finding of lung field: Secondary | ICD-10-CM

## 2023-11-18 NOTE — Patient Instructions (Addendum)
 It is good to see you today. Your scan shows a stable 5 mm pulmonary nodule. This is great news.  Video visit 11/2024 to discuss repeat CT chest 05/2025.  Then we will do a follow up visit after the 05/2025 scan Call if you need [place] sooner  Please contact office for sooner follow up if symptoms do not improve or worsen or seek emergency care

## 2023-11-18 NOTE — Progress Notes (Signed)
 History of Present Illness Holly [name] is a 63 y.o. female with referred for pulmonary nodule 08/2022. She has been followed   PMH - Small right middle lobe pulmonary nodule (5 mm), stable - Thyroid nodule (7 mm), stable - Pneumonia (once) - Atrial fibrillation  Synopsis 63 year old female, multiple pulmonary nodules. Incidentally found. Up-to-date on current cancer screening. She smoked a few cigarettes as a teenager. Never been a lifelong smoker. No other significant risk factors for malignancy. No direct family history of mother or father with lung cancer.   11/18/2023 Holly [name] is a 63 year old female who presents for follow-up of stable pulmonary nodules.  A CT scan on September 24, 2023, showed unchanged small pulmonary nodules measuring up to five millimeters in the right middle lobe. These nodules have been stable for approximately two years since first identified in December 2023 during a coronary CT scan. She has some concern about the nodules but is reassured by their stability.  She has a history of mild smoking during her youth, which she describes as 'insignificant' and states she quit many years ago. She also reports a past incident of chemical exposure at work, which caused acute respiratory distress at the time, but she has not experienced ongoing respiratory issues related to this event. She had pneumonia once after the incident and sometimes develops bronchitis when she catches a cold. No current respiratory issues. Oxygen level is 95%.  She has a history of hyperthyroidism, which was treated with radioactive iodine, leading to thyroid removal. She recalls experiencing significant anxiety and hyperactivity prior to treatment, which she attributes to the overactive thyroid. A stable 7 mm thyroid nodule was identified, unchanged from previous imaging.  She is currently living in [place], [place], and is staying with her father to help him. She prefers  to have her imaging done at [facility] on [address].  Nodule shows stability over the last 2 years. Patient would like to have a CT chest every 18 months to ensure this nodule does not change. We will schedule for follow up imaging in 18 months.   Test Results: CT Chest 09/18/2024 Mediastinum/Nodes: Trachea and esophagus are unremarkable. No thoracic adenopathy.   Lungs/Pleura: No focal consolidation, pleural effusion, or pneumothorax. Unchanged 5 mm right middle lobe nodule on series 10/image 173. Additional small nodules are also stable.   Upper Abdomen: Cholecystectomy.  No acute abnormality.   Musculoskeletal: No acute fracture.   IMPRESSION: 1. Unchanged small pulmonary nodules measuring up to 5 mm. No additional follow-up recommended.        Latest Ref Rng & Units 10/19/2022    5:00 PM 08/07/2019   11:45 AM 10/27/2016    5:06 PM  CBC  WBC 3.4 - 10.8 x10E3/uL 5.7  4.8  6.8   Hemoglobin 11.1 - 15.9 g/dL 09.8  11.9  14.7   Hematocrit 34.0 - 46.6 % 41.4  38.0  40.1   Platelets 150 - 450 x10E3/uL 170  160  155        Latest Ref Rng & Units 10/19/2022    5:00 PM 07/13/2022    4:54 PM 08/07/2019   11:45 AM  BMP  Glucose 70 - 99 mg/dL 829  562  130   BUN 6 - 24 mg/dL 11  9  11    Creatinine 0.57 - 1.00 mg/dL 8.65  7.84  6.96   BUN/Creat Ratio 9 - 23 13  10     Sodium 134 - 144 mmol/L 138  137  137   Potassium 3.5 - 5.2 mmol/L 4.0  4.6  3.8   Chloride 96 - 106 mmol/L 98  98  102   CO2 20 - 29 mmol/L 25  27  25    Calcium 8.7 - 10.2 mg/dL 9.4  9.9  9.4     BNP No results found for: "BNP"  ProBNP    Component Value Date/Time   PROBNP 28.0 01/13/2013 1156    PFT No results found for: "FEV1PRE", "FEV1POST", "FVCPRE", "FVCPOST", "TLC", "DLCOUNC", "PREFEV1FVCRT", "PSTFEV1FVCRT"  US Abdomen Complete Result Date: 11/14/2023 : PROCEDURE: ULTRASOUND ABDOMEN COMPLETE HISTORY: Patient is a 63 y/o  Female with fatty liver. COMPARISON: U/S RUQ 09/19/2022, 02/21/2021  TECHNIQUE: Two-dimensional grayscale and color Doppler ultrasound of the abdomen was performed. FINDINGS: The pancreas demonstrates a normal homogenous echotexture with the tail suboptimally visualized due to overlying bowel gas. The liver demonstrates an increased echotexture without intrahepatic biliary dilatation. No masses are visualized. The main portal vein demonstrates normal hepatopedal flow. The gallbladder is surgically absent. The common bile duct measures 0.4 cm. The right kidney measures 12.0 cm in length. The inferior pole is obscured by overlying bowel gas. Renal cortical echotexture is within normal limits. There is no hydronephrosis. There are no stones. There are no cysts. The left kidney measures 12.4 cm in length. Renal cortical echotexture is within normal limits. There is no hydronephrosis. There are no stones. There are no cysts. The spleen measures 12.9 cm in length and demonstrates normal echotexture. There is no evidence of aneurysm within the visualized segments of the abdominal aorta. The visualized segments of the IVC are unremarkable. IMPRESSION: 1. Increased hepatic echotexture, most commonly seen with steatosis. Correlation with LFT's is recommended. 2.  Surgically absent gallbladder.  No biliary dilatation. Thank you for allowing [place] to assist in the care of this patient. Electronically Signed   By: Lestine Box M.D.   On: 11/14/2023 15:36     Past medical hx Past Medical History:  Diagnosis Date   Anxiety    takes Ativan daily   Back pain    stenosis and buldging disc   Bone spur    neck and buldging disc   Depression    takes Cymbalta daily   Diabetes (HCC)    takes Metformin daily   Diverticulosis    GERD (gastroesophageal reflux disease)    takes Prevacid daily   History of bronchitis    > 5 yrs ago   History of hiatal hernia    Hypothyroidism    takes Synthroid daily   IBS (irritable bowel syndrome)    Insomnia    doesn't take any meds   Joint pain     Lung nodule    right middle lobe-was being followed by Dr.Burney.Medical Md is keeping up with this   Migraine    Migraine    last one 08/28/15   Nocturia    Numbness and tingling in hands    PONV (postoperative nausea and vomiting)    Sleep apnea    Thoracic outlet syndrome    TOS (thoracic outlet syndrome)    Urinary urgency      Social History   Tobacco Use   Smoking status: Former    Types: Cigarettes   Smokeless tobacco: Never  Substance Use Topics   Alcohol use: No   Drug use: No    Ms.Michels reports that she has quit smoking. Her smoking use included cigarettes. She has never used smokeless tobacco. She reports that  she does not drink alcohol and does not use drugs.  Tobacco Cessation: Former remote smoker    Past surgical hx, Family hx, Social hx all reviewed.  Current Outpatient Medications on File Prior to Visit  Medication Sig   acetaminophen (TYLENOL) 500 MG tablet Take 500 mg by mouth every 6 (six) hours as needed.   Buprenorphine HCl-Naloxone HCl 4-1 MG FILM Place 2 Film under the tongue 2 (two) times daily.   conjugated estrogens (PREMARIN) 0.625 MG/GM vaginal cream Place vaginally.   diclofenac Sodium (VOLTAREN) 1 % GEL APPLY 2 GRAMS TO AFFECTED AREA 4 TIMES A DAY   DiphenhydrAMINE HCl (BENADRYL ALLERGY PO) Take by mouth as needed. Taken with advil for migraine management.   ELIQUIS 5 MG TABS tablet TAKE 1 TABLET BY MOUTH TWICE A DAY   estradiol (ESTRACE) 0.1 MG/GM vaginal cream PLACE A BLUEBERRY SIZED DROP OF CREAM INTO VAGINA AND AROUND URETHRA TWICE WEEKLY   glucose blood test strip    linaclotide (LINZESS) 145 MCG CAPS capsule TAKE 1 CAP BY MOUTH EVERY DAY AT LEAST 30 MINUTES BEFORE THE 1ST MAIN MEAL OF DAY ON EMPTY STOMACH   LORazepam (ATIVAN) 2 MG tablet Take 2 mg by mouth 4 (four) times daily.   metFORMIN (GLUCOPHAGE) 500 MG tablet Take 1,000 mg by mouth 2 (two) times daily with a meal.    OnabotulinumtoxinA (BOTOX IJ) Inject as directed every 3  (three) months. For migraine prevention.   SYNTHROID 88 MCG tablet Take 100 mcg by mouth daily before breakfast.   Bempedoic Acid (NEXLETOL) 180 MG TABS Take 1 tablet (180 mg total) by mouth daily. (Patient not taking: Reported on 11/18/2023)   omeprazole (PRILOSEC) 40 MG capsule Take 40 mg by mouth every morning. 1 Capsule(s) By Mouth Every Morning (Patient not taking: Reported on 11/18/2023)   No current facility-administered medications on file prior to visit.     Allergies  Allergen Reactions   Cefuroxime Axetil Other (See Comments)    Obsessive Bowel.    Effexor Xr [Venlafaxine Hcl Er] Hives   Sumatriptan Other (See Comments)    Other Reaction: heart issues    Erenumab-Aooe Hives    " Smooth Muscle cramping in Esophagus "   Garlic Other (See Comments)   Onion Other (See Comments)   Statins    Zetia [Ezetimibe]     Muscle pain   Crestor [Rosuvastatin Calcium] Rash   Triptans Palpitations    Review Of Systems:  Constitutional:   No  weight loss, night sweats,  Fevers, chills, fatigue, or  lassitude.  HEENT:   No headaches,  Difficulty swallowing,  Tooth/dental problems, or  Sore throat,                No sneezing, itching, ear ache, nasal congestion, post nasal drip,   CV:  No chest pain,  Orthopnea, PND, swelling in lower extremities, anasarca, dizziness, palpitations, syncope.   GI  No heartburn, indigestion, abdominal pain, nausea, vomiting, diarrhea, change in bowel habits, loss of appetite, bloody stools.   Resp: No shortness of breath with exertion or at rest.  No excess mucus, no productive cough,  No non-productive cough,  No coughing up of blood.  No change in color of mucus.  No wheezing.  No chest wall deformity  Skin: no rash or lesions.  GU: no dysuria, change in color of urine, no urgency or frequency.  No flank pain, no hematuria   MS:  No joint pain or swelling.  No decreased range  of motion.  No back pain.  Psych:  No change in mood or affect. No  depression or anxiety.  No memory loss.   Vital Signs BP 128/75 (BP Location: Left Arm, Patient Position: Sitting, Cuff Size: Large)   Pulse 86   Ht 5\' 5"  (1.651 m)   Wt 206 lb 6.4 oz (93.6 kg)   SpO2 95%   BMI 34.35 kg/m    Physical Exam:  General- No distress,  A&Ox3, pleasant ENT: No sinus tenderness, TM clear, pale nasal mucosa, no oral exudate,no post nasal drip, no LAN Cardiac: S1, S2, regular rate and rhythm, no murmur Chest: No wheeze/ rales/ dullness; no accessory muscle use, no nasal flaring, no sternal retractions Abd.: Soft Non-tender, ND, BS +, Body mass index is 34.35 kg/m.  Ext: No clubbing cyanosis, edema, no obvious deformities Neuro:  normal strength, MAE x 4, A&O x 3, appropriate Skin: No rashes, warm and dry, no obvious lesions  Psych: normal mood and behavior   Assessment/Plan Pulmonary Nodule Stable 5mm right middle lobe nodule with no change over two years. History of mild smoking and chemical exposure at work. Patient expressed preference for continued monitoring. -Schedule video visit in February 2026 to discuss repeat CT chest in August 2026.  Thyroid Nodule Stable 7mm nodule, no change noted. -No further action required at this time. - Per PCP  I spent 20 minutes dedicated to the care of this patient on the date of this encounter to include pre-visit review of records, face-to-face time with the patient discussing conditions above, post visit ordering of testing, clinical documentation with the electronic health record, making appropriate referrals as documented, and communicating necessary information to the patient's healthcare team.     Bevelyn Ngo, NP 11/18/2023  4:02 PM

## 2023-11-25 DIAGNOSIS — L820 Inflamed seborrheic keratosis: Secondary | ICD-10-CM | POA: Diagnosis not present

## 2023-11-25 DIAGNOSIS — B0089 Other herpesviral infection: Secondary | ICD-10-CM | POA: Diagnosis not present

## 2023-12-06 NOTE — Telephone Encounter (Signed)
 Created in error

## 2024-01-01 ENCOUNTER — Other Ambulatory Visit (INDEPENDENT_AMBULATORY_CARE_PROVIDER_SITE_OTHER): Payer: Self-pay

## 2024-01-01 ENCOUNTER — Ambulatory Visit (INDEPENDENT_AMBULATORY_CARE_PROVIDER_SITE_OTHER): Admitting: Orthopedic Surgery

## 2024-01-01 DIAGNOSIS — M79672 Pain in left foot: Secondary | ICD-10-CM

## 2024-01-01 DIAGNOSIS — M79671 Pain in right foot: Secondary | ICD-10-CM

## 2024-01-01 NOTE — Progress Notes (Signed)
 Orthopedic Surgery Progress Note   Assessment: Patient is a 63 y.o. female with bilateral medial foot pain in the area of the navicular that seems consistent with posterior tibialis tendinitis   Plan: -I recommended Achilles stretching, topical anti-inflammatory, frequent elevation -We talked about medications that are often used in these cases.  However, she cannot take prednisone due to her diabetes.  She cannot take anti-inflammatories as she is already on a blood thinner.  She can use Tylenol up to 1000 mg 3 times daily.  She was not interested in taking a muscle relaxer for her spasms -Patient should return to the office on an as-needed basis  ___________________________________________________________________________  History: Patient has had bilateral foot pain for couple of months now.  She said she noticed it more so when she moved in with her dad.  She said she has not been able to elevate her legs as much.  She feels the pain is worse on the right side.  She feels it on the medial aspect of the foot in the area of the midfoot.  She said there was no trauma or injury that preceded the onset of pain.  She is also noticed some cramping in the calves that will come on randomly.  She will often get it at night as well.  She said she has been eating mustard to help but it tired of waking up multiple times at night to try to get the mustard. She has not tried any specific treatment so far.    Physical Exam:  General: no acute distress, appears stated age Neurologic: alert, answering questions appropriately, following commands Respiratory: unlabored breathing on room air, symmetric chest rise Psychiatric: appropriate affect, normal cadence to speech  MSK:   -Right foot exam: TTP over the medial midfoot in the area of the navicular, tenderness extends proximally along the hindfoot as well.  She does not have any tenderness to palpation over the remainder of the foot and ankle.  Able to  perform single-leg heel raise.  No swelling seen.  No open wounds seen.  No ulcer seen.  EHL/TA/GSC intact.  Sensation intact light touch in sural/saphenous/deep peroneal/superficial peroneal/tibial nerve distributions.  Foot warm well-perfused.  Palpable DP pulse.  -Left foot exam: TTP over the medial midfoot in the area of the navicular, tenderness extends proximally along the hindfoot as well.  She does not have any tenderness to palpation over the remainder of the foot and ankle.  Able to perform single-leg heel raise.  No swelling seen.  No open wounds seen.  No ulcer seen.  EHL/TA/GSC intact.  Sensation intact light touch in sural/saphenous/deep peroneal/superficial peroneal/tibial nerve distributions.  Foot warm well-perfused.  Palpable DP pulse.  Imaging: XRs of the right foot from 01/01/2024 were independently reviewed and interpreted, showing a calcification plantar to the cuboid and just proximal to the base of the fifth metatarsal.  No significant degenerative changes seen.  No fracture or dislocation seen.  XRs of the left foot from 01/01/2024 were independently reviewed and interpreted, showing a calcification plantar to the cuboid and just proximal to the base of the fifth metatarsal.  No significant degenerative changes seen.  No fracture or dislocation seen.   Patient name: Holly [name] Patient MRN: [id number] Date: 01/01/24

## 2024-01-24 DIAGNOSIS — E119 Type 2 diabetes mellitus without complications: Secondary | ICD-10-CM | POA: Diagnosis not present

## 2024-01-24 DIAGNOSIS — M797 Fibromyalgia: Secondary | ICD-10-CM | POA: Diagnosis not present

## 2024-01-24 DIAGNOSIS — G2581 Restless legs syndrome: Secondary | ICD-10-CM | POA: Diagnosis not present

## 2024-01-24 DIAGNOSIS — G43719 Chronic migraine without aura, intractable, without status migrainosus: Secondary | ICD-10-CM | POA: Diagnosis not present

## 2024-01-24 DIAGNOSIS — M542 Cervicalgia: Secondary | ICD-10-CM | POA: Diagnosis not present

## 2024-02-18 DIAGNOSIS — E1165 Type 2 diabetes mellitus with hyperglycemia: Secondary | ICD-10-CM | POA: Diagnosis not present

## 2024-02-18 DIAGNOSIS — E7800 Pure hypercholesterolemia, unspecified: Secondary | ICD-10-CM | POA: Diagnosis not present

## 2024-02-18 DIAGNOSIS — R591 Generalized enlarged lymph nodes: Secondary | ICD-10-CM | POA: Diagnosis not present

## 2024-02-18 DIAGNOSIS — E890 Postprocedural hypothyroidism: Secondary | ICD-10-CM | POA: Diagnosis not present

## 2024-03-18 ENCOUNTER — Telehealth: Payer: Self-pay | Admitting: Orthopedic Surgery

## 2024-03-18 DIAGNOSIS — M797 Fibromyalgia: Secondary | ICD-10-CM

## 2024-03-18 NOTE — Telephone Encounter (Signed)
 [name] left a message on my voicemail asking who Holly [name] had seen in the past from the Rheumatology office that we were connected with.  He asked for the physician name and number.  I called him back.  In speaking with him, he did find out since leaving the message that is was Holly [name] and that since she has not seen her in over 3 years she would be a new patient and would need a referral to be seen again.  Holly [name] states that she needs to be seen for fibromyalgia, which he says was originally diagnosed by Holly [name].  They would like to know if Holly [name] will put in a referral for her to be seen again.  He can be reached at [phone].

## 2024-03-19 NOTE — Telephone Encounter (Signed)
 I called and advised [name] of message from Dr. Sulema Endo

## 2024-03-19 NOTE — Addendum Note (Signed)
 Addended by: Colette Davies on: 03/19/2024 09:13 AM   Modules accepted: Orders

## 2024-05-01 ENCOUNTER — Ambulatory Visit (INDEPENDENT_AMBULATORY_CARE_PROVIDER_SITE_OTHER): Admitting: Podiatry

## 2024-05-01 DIAGNOSIS — Z91199 Patient's noncompliance with other medical treatment and regimen due to unspecified reason: Secondary | ICD-10-CM

## 2024-05-01 NOTE — Progress Notes (Signed)
 Cancel 24 hours

## 2024-05-06 ENCOUNTER — Ambulatory Visit (INDEPENDENT_AMBULATORY_CARE_PROVIDER_SITE_OTHER): Admitting: Podiatry

## 2024-05-06 DIAGNOSIS — Z91199 Patient's noncompliance with other medical treatment and regimen due to unspecified reason: Secondary | ICD-10-CM

## 2024-05-06 NOTE — Progress Notes (Signed)
 Cancel 24 hours

## 2024-05-13 ENCOUNTER — Ambulatory Visit (INDEPENDENT_AMBULATORY_CARE_PROVIDER_SITE_OTHER): Admitting: Podiatry

## 2024-05-13 ENCOUNTER — Encounter: Payer: Self-pay | Admitting: Podiatry

## 2024-05-13 DIAGNOSIS — M79674 Pain in right toe(s): Secondary | ICD-10-CM | POA: Diagnosis not present

## 2024-05-13 DIAGNOSIS — M79675 Pain in left toe(s): Secondary | ICD-10-CM | POA: Diagnosis not present

## 2024-05-13 DIAGNOSIS — E1142 Type 2 diabetes mellitus with diabetic polyneuropathy: Secondary | ICD-10-CM | POA: Diagnosis not present

## 2024-05-13 DIAGNOSIS — B351 Tinea unguium: Secondary | ICD-10-CM

## 2024-05-13 NOTE — Progress Notes (Signed)
  Subjective:  Patient ID: Holly [name], female    DOB: 04/11/1961,   MRN: [id number]  Chief Complaint  Patient presents with   Nail Problem    Made appt for L great toe infection.  Is much better. Took antibiotics packed with cotton. No redness or swelling. No pain. Diabetic A1c 6.5. Taking Elequis.  Would like to start getting nails trimmed.   Diabetes    63 y.o. female presents for concern of left great toe infection that has since resolved and has not issue with currently. Also concern of thickened elongated and painful nails that are difficult to trim. Requesting to have them trimmed today. Relates burning and tingling in their feet. Patient is diabetic and last A1c was  Lab Results  Component Value Date   HGBA1C 7.1 (H) 08/29/2015   .   PCP:  Charlott Dorn LABOR, MD   . Denies any other pedal complaints. Denies n/v/f/c.   Past Medical History:  Diagnosis Date   Anxiety    takes Ativan daily   Back pain    stenosis and buldging disc   Bone spur    neck and buldging disc   Depression    takes Cymbalta daily   Diabetes (HCC)    takes Metformin daily   Diverticulosis    GERD (gastroesophageal reflux disease)    takes Prevacid daily   History of bronchitis    > 5 yrs ago   History of hiatal hernia    Hypothyroidism    takes Synthroid daily   IBS (irritable bowel syndrome)    Insomnia    doesn't take any meds   Joint pain    Lung nodule    right middle lobe-was being followed by Dr.Burney.Medical Md is keeping up with this   Migraine    Migraine    last one 08/28/15   Nocturia    Numbness and tingling in hands    PONV (postoperative nausea and vomiting)    Sleep apnea    Thoracic outlet syndrome    TOS (thoracic outlet syndrome)    Urinary urgency     Objective:  Physical Exam: Vascular: DP/PT pulses 2/4 bilateral. CFT <3 seconds. Absent hair growth on digits. Edema noted to bilateral lower extremities. Xerosis noted bilaterally.  Skin. No  lacerations or abrasions bilateral feet. Nails 1-5 bilateral  are thickened discolored and elongated with subungual debris.  Musculoskeletal: MMT 5/5 bilateral lower extremities in DF, PF, Inversion and Eversion. Deceased ROM in DF of ankle joint.  Neurological: Sensation intact to light touch. Protective sensation intact bilateral.    Assessment:   1. Pain due to onychomycosis of toenails of both feet   2. Type 2 diabetes mellitus with peripheral neuropathy (HCC)      Plan:  Patient was evaluated and treated and all questions answered. -Discussed and educated patient on diabetic foot care, especially with  regards to the vascular, neurological and musculoskeletal systems.  -Stressed the importance of good glycemic control and the detriment of not  controlling glucose levels in relation to the foot. -Discussed supportive shoes at all times and checking feet regularly.  -Mechanically debrided all nails 1-5 bilateral using sterile nail nipper and filed with dremel without incident  -Answered all patient questions -Patient to return  in 3 months for at risk foot care -Patient advised to call the office if any problems or questions arise in the meantime.   Asberry Failing, DPM

## 2024-05-29 ENCOUNTER — Ambulatory Visit (HOSPITAL_BASED_OUTPATIENT_CLINIC_OR_DEPARTMENT_OTHER): Admitting: Cardiology

## 2024-05-29 ENCOUNTER — Encounter (HOSPITAL_BASED_OUTPATIENT_CLINIC_OR_DEPARTMENT_OTHER): Payer: Self-pay | Admitting: Cardiology

## 2024-05-29 VITALS — BP 120/70 | HR 90 | Resp 17 | Ht 65.0 in | Wt 202.0 lb

## 2024-05-29 DIAGNOSIS — D6869 Other thrombophilia: Secondary | ICD-10-CM | POA: Diagnosis not present

## 2024-05-29 DIAGNOSIS — I2510 Atherosclerotic heart disease of native coronary artery without angina pectoris: Secondary | ICD-10-CM

## 2024-05-29 DIAGNOSIS — I480 Paroxysmal atrial fibrillation: Secondary | ICD-10-CM | POA: Diagnosis not present

## 2024-05-29 DIAGNOSIS — Z7901 Long term (current) use of anticoagulants: Secondary | ICD-10-CM | POA: Diagnosis not present

## 2024-05-29 DIAGNOSIS — E7800 Pure hypercholesterolemia, unspecified: Secondary | ICD-10-CM | POA: Diagnosis not present

## 2024-05-29 DIAGNOSIS — Z7189 Other specified counseling: Secondary | ICD-10-CM

## 2024-05-29 MED ORDER — APIXABAN 5 MG PO TABS
5.0000 mg | ORAL_TABLET | Freq: Two times a day (BID) | ORAL | 3 refills | Status: AC
Start: 2024-05-29 — End: ?

## 2024-05-29 NOTE — Patient Instructions (Signed)
 Medication Instructions:  Your physician recommends that you continue on your current medications as directed. Please refer to the Current Medication list given to you today.  *If you need a refill on your cardiac medications before your next appointment, please call your pharmacy*  Lab Work: NONE  Testing/Procedures: NONE  Follow-Up: At [facility], you and your health needs are our priority.  As part of our continuing mission to provide you with exceptional heart care, we have created designated Provider Care Teams.  These Care Teams include your primary Cardiologist (physician) and Advanced Practice Providers (APPs -  Physician Assistants and Nurse Practitioners) who all work together to provide you with the care you need, when you need it.  We recommend signing up for the patient portal called MyChart.  Sign up information is provided on this After Visit Summary.  MyChart is used to connect with patients for Virtual Visits (Telemedicine).  Patients are able to view lab/test results, encounter notes, upcoming appointments, etc.  Non-urgent messages can be sent to your provider as well.   To learn more about what you can do with MyChart, go to [email or web address].    Your next appointment:   12 month(s)  The format for your next appointment:   In Person  Provider:   Shelda Bruckner MD, Reche ORN NP, or Rosaline  NP

## 2024-05-29 NOTE — Progress Notes (Signed)
 Cardiology Office Note:  .   Date:  05/29/2024  ID:  Holly [name], DOB [birth year], MRN [id number] PCP: Charlott Dorn LABOR, MD  [facility] Providers Cardiologist:  Shelda Bruckner, MD {  History of Present Illness: .   Holly [name] is a 63 y.o. female with PMH paroxysmal atrial fibrillation, type II diabetes, obesity, hyperlipidemia, OSA on CPAP. She was previously followed by Dr. Alveta and established care with me on 05/29/24.  Pertinent CV history: CT coronary 2023 showed Ca score 3.35 (71st %ile), minimal RCA plaque with <25% stenosis. Echo 2023 with EF 55-60%, normal diastolic function, normal strain. No significant valve disease. Monitor in 2023 did not show significant arrhythmia.  Today: Overall doing well, though has a lot of stress caring for her 15 year old father. Also has fibromyalgia and chronic migraines, though botox helping with migraines.  Feels afib very rarely, does not last a long time.  ROS: Denies chest pain, shortness of breath at rest or with normal exertion. No PND, orthopnea, LE edema or unexpected weight gain. No syncope or palpitations. ROS otherwise negative except as noted.   Studies Reviewed: SABRA    EKG:  EKG Interpretation Date/Time:  Friday May 29 2024 15:27:20 EDT Ventricular Rate:  76 PR Interval:  136 QRS Duration:  74 QT Interval:  368 QTC Calculation: 414 R Axis:   -3  Text Interpretation: Normal sinus rhythm Normal ECG When compared with ECG of 30-May-2023 14:19, No significant change was found Confirmed by Bruckner Shelda [phone]) on 05/29/2024 3:43:51 PM    Physical Exam:   VS:  BP 120/70 (BP Location: Left Arm, Patient Position: Sitting, Cuff Size: Large)   Pulse 90   Resp 17   Ht 5' 5 (1.651 m)   Wt 202 lb (91.6 kg)   SpO2 97%   BMI 33.61 kg/m    Wt Readings from Last 3 Encounters:  05/29/24 202 lb (91.6 kg)  11/18/23 206 lb 6.4 oz (93.6 kg)  05/30/23 190 lb (86.2 kg)    GEN: Well nourished, well  developed in no acute distress HEENT: Normal, moist mucous membranes NECK: No JVD CARDIAC: regular rhythm, normal S1 and S2, no rubs or gallops. No murmur. VASCULAR: Radial and DP pulses 2+ bilaterally. No carotid bruits RESPIRATORY:  Clear to auscultation without rales, wheezing or rhonchi  ABDOMEN: Soft, non-tender, non-distended MUSCULOSKELETAL:  Ambulates independently SKIN: Warm and dry, no edema NEUROLOGIC:  Alert and oriented x 3. No focal neuro deficits noted. PSYCHIATRIC:  Normal affect    ASSESSMENT AND PLAN: .    Paroxysmal atrial fibrillation Secondary hypercoagulable state -CHA2DS2/VAS Stroke Risk Points = 3  -no bleeding issues, continue apixaban    -has OSA, on CPAP  Type II diabetes Obesity, BMI 33 Nonobstructive CAD Aortic atherosclerosis Hypercholesterolemia -reacted to statin and ezetimibe , father on PCSK9i but has still had strokes, she doesn't want to use these medications if possible -working to manage risk with lifestyle and supplements  CV risk counseling and prevention -recommend heart healthy/Mediterranean diet, with whole grains, fruits, vegetable, fish, lean meats, nuts, and olive oil. Limit salt. -recommend moderate walking, 3-5 times/week for 30-50 minutes each session. Aim for at least 150 minutes/week. Goal should be pace of 3 miles/hours, or walking 1.5 miles in 30 minutes -recommend avoidance of tobacco products. Avoid excess alcohol.   Dispo: 1 year or sooner as needed  Signed, Shelda Bruckner, MD   Shelda Bruckner, MD, PhD, [facility]  [facility]  Heart & Vascular at [facility] at [facility] [address] [place], [place] [address] [phone]

## 2024-06-15 ENCOUNTER — Emergency Department (HOSPITAL_COMMUNITY)

## 2024-06-15 ENCOUNTER — Encounter (HOSPITAL_COMMUNITY): Payer: Self-pay | Admitting: *Deleted

## 2024-06-15 ENCOUNTER — Other Ambulatory Visit: Payer: Self-pay

## 2024-06-15 ENCOUNTER — Observation Stay (HOSPITAL_COMMUNITY)
Admission: EM | Admit: 2024-06-15 | Discharge: 2024-06-17 | Disposition: A | Discharge status: Home / Self Care | Attending: Family Medicine | Admitting: Family Medicine

## 2024-06-15 DIAGNOSIS — N179 Acute kidney failure, unspecified: Secondary | ICD-10-CM | POA: Insufficient documentation

## 2024-06-15 DIAGNOSIS — E119 Type 2 diabetes mellitus without complications: Secondary | ICD-10-CM | POA: Diagnosis not present

## 2024-06-15 DIAGNOSIS — F39 Unspecified mood [affective] disorder: Secondary | ICD-10-CM | POA: Diagnosis not present

## 2024-06-15 DIAGNOSIS — G8929 Other chronic pain: Secondary | ICD-10-CM | POA: Insufficient documentation

## 2024-06-15 DIAGNOSIS — Z7984 Long term (current) use of oral hypoglycemic drugs: Secondary | ICD-10-CM | POA: Diagnosis not present

## 2024-06-15 DIAGNOSIS — D709 Neutropenia, unspecified: Secondary | ICD-10-CM | POA: Insufficient documentation

## 2024-06-15 DIAGNOSIS — E66811 Obesity, class 1: Secondary | ICD-10-CM | POA: Insufficient documentation

## 2024-06-15 DIAGNOSIS — E785 Hyperlipidemia, unspecified: Secondary | ICD-10-CM | POA: Insufficient documentation

## 2024-06-15 DIAGNOSIS — I480 Paroxysmal atrial fibrillation: Secondary | ICD-10-CM | POA: Insufficient documentation

## 2024-06-15 DIAGNOSIS — Z7901 Long term (current) use of anticoagulants: Secondary | ICD-10-CM | POA: Diagnosis not present

## 2024-06-15 DIAGNOSIS — Z79899 Other long term (current) drug therapy: Secondary | ICD-10-CM | POA: Diagnosis not present

## 2024-06-15 DIAGNOSIS — E039 Hypothyroidism, unspecified: Secondary | ICD-10-CM | POA: Diagnosis not present

## 2024-06-15 DIAGNOSIS — U071 COVID-19: Secondary | ICD-10-CM | POA: Diagnosis not present

## 2024-06-15 DIAGNOSIS — R2681 Unsteadiness on feet: Secondary | ICD-10-CM | POA: Diagnosis not present

## 2024-06-15 DIAGNOSIS — Z6832 Body mass index (BMI) 32.0-32.9, adult: Secondary | ICD-10-CM | POA: Insufficient documentation

## 2024-06-15 DIAGNOSIS — Z87891 Personal history of nicotine dependence: Secondary | ICD-10-CM | POA: Diagnosis not present

## 2024-06-15 DIAGNOSIS — I2510 Atherosclerotic heart disease of native coronary artery without angina pectoris: Secondary | ICD-10-CM | POA: Diagnosis not present

## 2024-06-15 DIAGNOSIS — J9601 Acute respiratory failure with hypoxia: Secondary | ICD-10-CM | POA: Diagnosis not present

## 2024-06-15 DIAGNOSIS — J1282 Pneumonia due to coronavirus disease 2019: Secondary | ICD-10-CM | POA: Diagnosis not present

## 2024-06-15 DIAGNOSIS — E876 Hypokalemia: Secondary | ICD-10-CM | POA: Insufficient documentation

## 2024-06-15 DIAGNOSIS — G4733 Obstructive sleep apnea (adult) (pediatric): Secondary | ICD-10-CM | POA: Diagnosis not present

## 2024-06-15 DIAGNOSIS — R0602 Shortness of breath: Secondary | ICD-10-CM | POA: Diagnosis present

## 2024-06-15 HISTORY — DX: Unspecified atrial fibrillation: I48.91

## 2024-06-15 LAB — CBC WITH DIFFERENTIAL/PLATELET
Abs Immature Granulocytes: 0 K/uL (ref 0.00–0.07)
Basophils Absolute: 0 K/uL (ref 0.0–0.1)
Basophils Relative: 1 %
Eosinophils Absolute: 0 K/uL (ref 0.0–0.5)
Eosinophils Relative: 0 %
HCT: 37 % (ref 36.0–46.0)
Hemoglobin: 12.7 g/dL (ref 12.0–15.0)
Immature Granulocytes: 0 %
Lymphocytes Relative: 36 %
Lymphs Abs: 1 K/uL (ref 0.7–4.0)
MCH: 29.9 pg (ref 26.0–34.0)
MCHC: 34.3 g/dL (ref 30.0–36.0)
MCV: 87.1 fL (ref 80.0–100.0)
Monocytes Absolute: 0.3 K/uL (ref 0.1–1.0)
Monocytes Relative: 12 %
Neutro Abs: 1.4 K/uL — ABNORMAL LOW (ref 1.7–7.7)
Neutrophils Relative %: 51 %
Platelets: 102 K/uL — ABNORMAL LOW (ref 150–400)
RBC: 4.25 MIL/uL (ref 3.87–5.11)
RDW: 11.8 % (ref 11.5–15.5)
WBC: 2.7 K/uL — ABNORMAL LOW (ref 4.0–10.5)
nRBC: 0 % (ref 0.0–0.2)

## 2024-06-15 LAB — URINALYSIS, ROUTINE W REFLEX MICROSCOPIC
Bilirubin Urine: NEGATIVE
Glucose, UA: NEGATIVE mg/dL
Hgb urine dipstick: NEGATIVE
Ketones, ur: 5 mg/dL — AB
Leukocytes,Ua: NEGATIVE
Nitrite: NEGATIVE
Protein, ur: NEGATIVE mg/dL
Specific Gravity, Urine: 1.009 (ref 1.005–1.030)
pH: 5 (ref 5.0–8.0)

## 2024-06-15 LAB — BASIC METABOLIC PANEL WITH GFR
Anion gap: 11 (ref 5–15)
BUN: 14 mg/dL (ref 6–20)
CO2: 24 mmol/L (ref 22–32)
Calcium: 8.7 mg/dL — ABNORMAL LOW (ref 8.9–10.3)
Chloride: 95 mmol/L — ABNORMAL LOW (ref 98–111)
Creatinine, Ser: 1.21 mg/dL — ABNORMAL HIGH (ref 0.44–1.00)
GFR, Estimated: 51 mL/min — ABNORMAL LOW (ref >60)
Glucose, Bld: 123 mg/dL — ABNORMAL HIGH (ref 70–99)
Potassium: 3.4 mmol/L — ABNORMAL LOW (ref 3.5–5.1)
Sodium: 130 mmol/L — ABNORMAL LOW (ref 135–145)

## 2024-06-15 LAB — TROPONIN I (HIGH SENSITIVITY): Troponin I (High Sensitivity): 4 ng/L (ref <18)

## 2024-06-15 LAB — I-STAT CHEM 8, ED
BUN: 15 mg/dL (ref 6–20)
Calcium, Ion: 1.1 mmol/L — ABNORMAL LOW (ref 1.15–1.40)
Chloride: 94 mmol/L — ABNORMAL LOW (ref 98–111)
Creatinine, Ser: 1.1 mg/dL — ABNORMAL HIGH (ref 0.44–1.00)
Glucose, Bld: 122 mg/dL — ABNORMAL HIGH (ref 70–99)
HCT: 37 % (ref 36.0–46.0)
Hemoglobin: 12.6 g/dL (ref 12.0–15.0)
Potassium: 3.4 mmol/L — ABNORMAL LOW (ref 3.5–5.1)
Sodium: 131 mmol/L — ABNORMAL LOW (ref 135–145)
TCO2: 22 mmol/L (ref 22–32)

## 2024-06-15 LAB — D-DIMER, QUANTITATIVE: D-Dimer, Quant: 0.3 ug{FEU}/mL (ref 0.00–0.50)

## 2024-06-15 LAB — LACTIC ACID, PLASMA
Lactic Acid, Venous: 0.8 mmol/L (ref 0.5–1.9)
Lactic Acid, Venous: 0.8 mmol/L (ref 0.5–1.9)

## 2024-06-15 MED ORDER — ONDANSETRON HCL 4 MG/2ML IJ SOLN: 4.0000 mg | Freq: Four times a day (QID) | INTRAMUSCULAR | Status: DC | PRN

## 2024-06-15 MED ORDER — BUPRENORPHINE HCL-NALOXONE HCL 2-0.5 MG SL SUBL
2.0000 | SUBLINGUAL_TABLET | Freq: Two times a day (BID) | SUBLINGUAL | Status: DC
  Administered 2024-06-15 – 2024-06-17 (×3): 2 via SUBLINGUAL
  Filled 2024-06-15 (×3): qty 2

## 2024-06-15 MED ORDER — ACETAMINOPHEN 325 MG PO TABS
650.0000 mg | ORAL_TABLET | Freq: Once | ORAL | Status: AC | PRN
  Administered 2024-06-15: 650 mg via ORAL

## 2024-06-15 MED ORDER — FAMOTIDINE 20 MG PO TABS: 20.0000 mg | ORAL_TABLET | Freq: Every day | ORAL | Status: DC | PRN

## 2024-06-15 MED ORDER — INSULIN ASPART 100 UNIT/ML IJ SOLN: 0.0000 [IU] | Freq: Three times a day (TID) | INTRAMUSCULAR | Status: DC

## 2024-06-15 MED ORDER — SODIUM CHLORIDE 0.9 % IV BOLUS
1000.0000 mL | Freq: Once | INTRAVENOUS | Status: AC
  Administered 2024-06-15: 1000 mL via INTRAVENOUS

## 2024-06-15 MED ORDER — SODIUM CHLORIDE 0.9% FLUSH
3.0000 mL | Freq: Two times a day (BID) | INTRAVENOUS | Status: DC
  Administered 2024-06-15 – 2024-06-17 (×4): 3 mL via INTRAVENOUS

## 2024-06-15 MED ORDER — APIXABAN 5 MG PO TABS
5.0000 mg | ORAL_TABLET | Freq: Two times a day (BID) | ORAL | Status: DC
  Administered 2024-06-15 – 2024-06-17 (×4): 5 mg via ORAL
  Filled 2024-06-15 (×4): qty 1

## 2024-06-15 MED ORDER — DEXAMETHASONE 4 MG PO TABS
6.0000 mg | ORAL_TABLET | Freq: Every day | ORAL | Status: DC
  Administered 2024-06-15: 6 mg via ORAL
  Filled 2024-06-15: qty 2

## 2024-06-15 MED ORDER — ACETAMINOPHEN 325 MG PO TABS
ORAL_TABLET | ORAL | Status: AC
  Filled 2024-06-15: qty 2

## 2024-06-15 MED ORDER — MELATONIN 3 MG PO TABS: 6.0000 mg | ORAL_TABLET | Freq: Every evening | ORAL | Status: DC | PRN

## 2024-06-15 MED ORDER — ALBUTEROL SULFATE (2.5 MG/3ML) 0.083% IN NEBU: 2.5000 mg | INHALATION_SOLUTION | RESPIRATORY_TRACT | Status: DC | PRN

## 2024-06-15 MED ORDER — LEVOTHYROXINE SODIUM 100 MCG PO TABS
100.0000 ug | ORAL_TABLET | ORAL | Status: DC
  Administered 2024-06-16 – 2024-06-17 (×2): 100 ug via ORAL
  Filled 2024-06-15 (×2): qty 1

## 2024-06-15 MED ORDER — POTASSIUM CHLORIDE CRYS ER 20 MEQ PO TBCR
40.0000 meq | EXTENDED_RELEASE_TABLET | Freq: Once | ORAL | Status: AC
  Administered 2024-06-15: 40 meq via ORAL
  Filled 2024-06-15: qty 2

## 2024-06-15 MED ORDER — ACETAMINOPHEN 500 MG PO TABS: 1000.0000 mg | ORAL_TABLET | Freq: Four times a day (QID) | ORAL | Status: DC | PRN

## 2024-06-15 MED ORDER — IOHEXOL 350 MG/ML SOLN
75.0000 mL | Freq: Once | INTRAVENOUS | Status: AC | PRN
  Administered 2024-06-15: 75 mL via INTRAVENOUS

## 2024-06-15 MED ORDER — ALBUTEROL SULFATE (2.5 MG/3ML) 0.083% IN NEBU
2.5000 mg | INHALATION_SOLUTION | RESPIRATORY_TRACT | Status: DC
  Administered 2024-06-16: 2.5 mg via RESPIRATORY_TRACT
  Filled 2024-06-15: qty 3

## 2024-06-15 MED ORDER — LORAZEPAM 1 MG PO TABS
2.0000 mg | ORAL_TABLET | Freq: Four times a day (QID) | ORAL | Status: DC | PRN
  Administered 2024-06-15 – 2024-06-16 (×2): 2 mg via ORAL
  Filled 2024-06-15 (×2): qty 2

## 2024-06-15 MED ORDER — POLYETHYLENE GLYCOL 3350 17 G PO PACK: 17.0000 g | PACK | Freq: Every day | ORAL | Status: DC | PRN

## 2024-06-15 NOTE — ED Provider Triage Note (Signed)
 Emergency Medicine Provider Triage Evaluation Note  Holly [name] , a 63 y.o. female  was evaluated in triage.  Pt complains of back pain, fevers, recent positive covid test  Review of Systems  Positive: Back pain, fatigue, fevers Negative: Dysuria, chest pain  Physical Exam  BP 118/64   Pulse 77   Temp (!) 101.9 F (38.8 C)   Resp 18   Ht 5' 5 (1.651 m)   Wt 89.4 kg   SpO2 90%   BMI 32.78 kg/m  Gen:   Awake, no distress   Resp:  Normal effort  MSK:   Moves extremities without difficulty    Medical Decision Making  Medically screening exam initiated at 11:49 AM.  Appropriate orders placed.  Holly [name] was informed that the remainder of the evaluation will be completed by another provider, this initial triage assessment does not replace that evaluation, and the importance of remaining in the ED until their evaluation is complete.     Holly [name] L, DO 06/15/24 1150

## 2024-06-15 NOTE — ED Provider Notes (Signed)
 [place] EMERGENCY DEPARTMENT AT [facility] Provider Note   CSN: [id number] Arrival date & time: 06/15/24  9040     Patient presents with: Shortness of Breath   Holly [name] is a 63 y.o. female here for evaluation of feeling unwell.  Friday, so developing some myalgias, fever.  Has been with similar symptoms.  They both tested positive for COVID.  Since then she has been lightheaded, dizzy, weak feels like her gait is off.  She also admits to some severe left upper flank pain.  Feels short of breath.  No overt chest pain.  No neck pain, neck stiffness, blurred vision, numbness.  She has some generalized weakness.  She states she was beginning an MRI for constant right sided headaches however has been unable to do this with sedation was going to have this done at [facility] with anesthesia.  She denies any dysuria, hematuria, anterior abdominal pain.  He can Tylenol  at home without relief of symptoms.   HPI     Prior to Admission medications   Medication Sig Start Date End Date Taking? Authorizing Provider  acetaminophen  (TYLENOL ) 500 MG tablet Take 500 mg by mouth every 6 (six) hours as needed.   Yes [provider]  apixaban  (ELIQUIS ) 5 MG TABS tablet Take 1 tablet (5 mg total) by mouth 2 (two) times daily. 05/29/24  Yes Lonni Slain, MD  Buprenorphine  HCl-Naloxone  HCl 4-1 MG FILM Place 2 Film under the tongue 2 (two) times daily. 06/22/22  Yes [provider]  Cholecalciferol 125 MCG (5000 UT) capsule Take 5,000 Units by mouth daily.   Yes [provider]  conjugated estrogens (PREMARIN) 0.625 MG/GM vaginal cream Place 1 Applicatorful vaginally daily as needed. 12/06/14  Yes [provider]  DiphenhydrAMINE  HCl (BENADRYL  ALLERGY PO) Take by mouth as needed. Taken with advil  for migraine management.   Yes [provider]  famotidine  (PEPCID ) 20 MG tablet Take 20 mg by mouth daily as needed for heartburn.   Yes [provider]  lansoprazole (PREVACID) 30 MG capsule Take 30 mg by mouth daily as needed (for acid reflux).   Yes [provider]  levothyroxine  (SYNTHROID ) 100 MCG tablet Take 100 mcg by mouth See admin instructions. Take 1 tablet by mouth daily, except on Sunday 07/16/22  Yes [provider]  LORazepam  (ATIVAN ) 2 MG tablet Take 2 mg by mouth 4 (four) times daily.   Yes [provider]  metFORMIN (GLUCOPHAGE) 500 MG tablet Take 1,000 mg by mouth 2 (two) times daily as needed (for blood glucose 170 or greater).   Yes [provider]  OnabotulinumtoxinA (BOTOX IJ) Inject as directed every 3 (three) months. For migraine prevention.   Yes [provider]  glucose blood test strip  01/10/15   [provider]  linaclotide (LINZESS) 145 MCG CAPS capsule TAKE 1 CAP BY MOUTH EVERY DAY AT LEAST 30 MINUTES BEFORE THE 1ST MAIN MEAL OF DAY ON EMPTY STOMACH Patient not taking: Reported on 06/15/2024    [provider]    Allergies: Cefuroxime axetil, Effexor xr [venlafaxine hcl er], Sumatriptan, Erenumab-aooe, Garlic, Onion, Statins, Zetia  [ezetimibe ], Crestor [rosuvastatin calcium], and Triptans    Review of Systems  Constitutional:  Positive for activity change, appetite change and fever.  HENT: Negative.    Respiratory:  Positive for cough and shortness of breath.   Cardiovascular: Negative.   Gastrointestinal:  Positive for nausea. Negative for abdominal distention, abdominal pain, anal bleeding, blood in stool, constipation,  diarrhea and vomiting.  Musculoskeletal:  Positive for myalgias.  Neurological:  Positive for dizziness, weakness (gen weakness), light-headedness and headaches (chronic). Negative for tremors, seizures, syncope, facial asymmetry, speech difficulty and numbness.  All other systems reviewed and are negative.   Updated Vital Signs BP (!) 126/57   Pulse 63   Temp 99.1 F (37.3 C) (Oral)   Resp 16   Ht 5' 5 (1.651  m)   Wt 89.4 kg   SpO2 100%   BMI 32.78 kg/m   Physical Exam Vitals and nursing note reviewed.  Constitutional:      General: She is not in acute distress.    Appearance: She is well-developed. She is not ill-appearing, toxic-appearing or diaphoretic.  HENT:     Head: Normocephalic and atraumatic.  Eyes:     Pupils: Pupils are equal, round, and reactive to light.  Cardiovascular:     Rate and Rhythm: Normal rate.     Pulses: Normal pulses.          Radial pulses are 2+ on the right side and 2+ on the left side.       Dorsalis pedis pulses are 2+ on the right side and 2+ on the left side.     Heart sounds: Normal heart sounds.  Pulmonary:     Effort: Pulmonary effort is normal. No respiratory distress.     Breath sounds: Normal breath sounds.     Comments: Mild decreased breath sounds at bases however no overt wheeze.  Speaks without difficulty. Chest:     Comments: Nontender chest wall Abdominal:     General: Bowel sounds are normal. There is no distension.     Palpations: Abdomen is soft.     Tenderness: There is abdominal tenderness.     Comments: Anterior abdomen soft, nontender.  Negative CVA tap on right, tenderness left upper flank  Musculoskeletal:        General: Normal range of motion.     Cervical back: Normal range of motion.     Right lower leg: No tenderness. No edema.     Left lower leg: No tenderness. No edema.     Comments: No bony tenderness, compartments soft, for range of motion, no midline C/T/L tenderness.  No neck stiffness or neck rigidity.  Skin:    General: Skin is warm and dry.  Neurological:     General: No focal deficit present.     Mental Status: She is alert.     Cranial Nerves: Cranial nerves 2-12 are intact. No cranial nerve deficit.     Sensory: Sensation is intact.     Motor: Weakness present.     Coordination: Coordination is intact.     Comments: PERRLA Cranial nerves 2 through 12 grossly intact Equal strength bilateral, intact  sensation  Psychiatric:        Mood and Affect: Mood normal.     (all labs ordered are listed, but only abnormal results are displayed) Labs Reviewed  URINALYSIS, ROUTINE W REFLEX MICROSCOPIC - Abnormal; Notable for the following components:      Result Value   Ketones, ur 5 (*)    All other components within normal limits  CBC WITH DIFFERENTIAL/PLATELET - Abnormal; Notable for the following components:   WBC 2.7 (*)    Platelets 102 (*)    Neutro Abs 1.4 (*)    All other components within normal limits  BASIC METABOLIC PANEL WITH GFR - Abnormal; Notable for the following components:   Sodium  130 (*)    Potassium 3.4 (*)    Chloride 95 (*)    Glucose, Bld 123 (*)    Creatinine, Ser 1.21 (*)    Calcium 8.7 (*)    GFR, Estimated 51 (*)    All other components within normal limits  I-STAT CHEM 8, ED - Abnormal; Notable for the following components:   Sodium 131 (*)    Potassium 3.4 (*)    Chloride 94 (*)    Creatinine, Ser 1.10 (*)    Glucose, Bld 122 (*)    Calcium, Ion 1.10 (*)    All other components within normal limits  CULTURE, BLOOD (ROUTINE X 2)  CULTURE, BLOOD (ROUTINE X 2)  D-DIMER, QUANTITATIVE  LACTIC ACID, PLASMA  LACTIC ACID, PLASMA  TROPONIN I (HIGH SENSITIVITY)    EKG: None  Radiology: CT ABDOMEN PELVIS W CONTRAST Result Date: 06/15/2024 CLINICAL DATA:  Acute abdominal pain radiating to the left, initial encounter EXAM: CT ABDOMEN AND PELVIS WITH CONTRAST TECHNIQUE: Multidetector CT imaging of the abdomen and pelvis was performed using the standard protocol following bolus administration of intravenous contrast. RADIATION DOSE REDUCTION: This exam was performed according to the departmental dose-optimization program which includes automated exposure control, adjustment of the mA and/or kV according to patient size and/or use of iterative reconstruction technique. CONTRAST:  75mL OMNIPAQUE  IOHEXOL  350 MG/ML SOLN COMPARISON:  07/19/2017 FINDINGS: Lower chest:  Lung bases are free of acute infiltrate or sizable effusion. Small parenchymal nodule is noted in the right middle lobe best seen on image number 7 of series 5. This is stable from the prior exam and consistent with a benign etiology. Hepatobiliary: No focal liver abnormality is seen. Status post cholecystectomy. No biliary dilatation. Pancreas: Unremarkable. No pancreatic ductal dilatation or surrounding inflammatory changes. Spleen: Normal in size without focal abnormality. Adrenals/Urinary Tract: Adrenal glands are within normal limits. Kidneys are well visualized bilaterally. No renal calculi or obstructive changes are noted. The bladder is partially distended. Stomach/Bowel: The appendix is within normal limits. No obstructive or inflammatory changes of the colon are seen. Small bowel and stomach are within normal limits. Vascular/Lymphatic: No significant vascular findings are present. No enlarged abdominal or pelvic lymph nodes. Reproductive: Uterus and bilateral adnexa are unremarkable. Other: No abdominal wall hernia or abnormality. No abdominopelvic ascites. Musculoskeletal: No acute or significant osseous findings. IMPRESSION: No acute abnormality is identified correspond with the given clinical history. Stable right middle lobe nodule is seen dating back to 2018 consistent with a benign etiology. Electronically Signed   By: Oneil Devonshire M.D.   On: 06/15/2024 20:49   CT Head Wo Contrast Result Date: 06/15/2024 CLINICAL DATA:  Recent syncopal episode EXAM: CT HEAD WITHOUT CONTRAST TECHNIQUE: Contiguous axial images were obtained from the base of the skull through the vertex without intravenous contrast. RADIATION DOSE REDUCTION: This exam was performed according to the departmental dose-optimization program which includes automated exposure control, adjustment of the mA and/or kV according to patient size and/or use of iterative reconstruction technique. COMPARISON:  05/05/2016 FINDINGS: Brain: No  evidence of acute infarction, hemorrhage, hydrocephalus, extra-axial collection or mass lesion/mass effect. Vascular: No hyperdense vessel or unexpected calcification. Skull: Normal. Negative for fracture or focal lesion. Sinuses/Orbits: No acute finding. Other: None. IMPRESSION: No acute intracranial abnormality noted. Electronically Signed   By: Oneil Devonshire M.D.   On: 06/15/2024 20:45   DG Chest 2 View Result Date: 06/15/2024 CLINICAL DATA:  Short of breath EXAM: CHEST - 2 VIEW COMPARISON:  None Available.  FINDINGS: Normal mediastinum and cardiac silhouette. Normal pulmonary vasculature. No evidence of effusion, infiltrate, or pneumothorax. No acute bony abnormality. IMPRESSION: No acute cardiopulmonary process. Electronically Signed   By: Jackquline Boxer M.D.   On: 06/15/2024 13:58     .Critical Care  Performed by: Edie Rosebud LABOR, PA-C Authorized by: Edie Rosebud LABOR, PA-C   Critical care provider statement:    Critical care time (minutes):  35   Critical care was necessary to treat or prevent imminent or life-threatening deterioration of the following conditions:  Respiratory failure (Hypoxic respiratory failure)   Critical care was time spent personally by me on the following activities:  Development of treatment plan with patient or surrogate, discussions with consultants, evaluation of patient's response to treatment, examination of patient, ordering and review of laboratory studies, ordering and review of radiographic studies, ordering and performing treatments and interventions, pulse oximetry, re-evaluation of patient's condition and review of old charts    Medications Ordered in the ED  dexamethasone  (DECADRON ) tablet 6 mg (has no administration in time range)  acetaminophen  (TYLENOL ) tablet 650 mg (650 mg Oral Given 06/15/24 1117)  sodium chloride  0.9 % bolus 1,000 mL (0 mLs Intravenous Stopped 06/15/24 2131)  iohexol  (OMNIPAQUE ) 350 MG/ML injection 75 mL (75 mLs Intravenous  Contrast Given 06/15/24 6428)   63 year old here for evaluation of feeling unwell.  Started feeling unwell on Friday, subsequently took a home COVID test which was positive.  Has been ill as well.  Since then she has had persistent fevers now feeling lightheaded, dizzy, states she feels very unsteady when she walks.  Associated shortness of breath and right upper flank pain.  No urinary symptoms.  No overt chest pain.  She has a nonfocal neuroexam without deficits.  She states she is post be having an MRI for chronic migraine on the right however has been unable to do this with sedation in the outpatient setting and they were planning on MR with anesthesia at [facility] for this.  Here she appears unwell.  She is febrile, hypoxic.  Her lungs has a mild decreased lung sounds at the bases however otherwise clear.  She was placed on 2 L via nasal cannula, does not use oxygen at home, does use CPAP for OSA however is not been able to use due to her congestion at night.  Will plan on labs, imaging, reassess.  Anticipate likely admission due to acute hypoxic respiratory failure.  Labs and imaging personally viewed and interpreted:  CBC leukopenia 2.7 Metabolic panel sodium 130, potassium 3.4, creatinine 1.21-prior 0.8 Ddimer 0.3 Trop 4 EKG without ischemic changes Lactic 0.8 BC pending UA neg for infection or blood Chest x-ray without cardiomegaly, pulm edema, pneumothorax  Patient reassessed.  Discussed labs and imaging.  Still waiting on CT scans.  Will need to be admitted for acute hypoxic respiratory.  Likely in setting of COVID infection  Discussed with hospitalist who is agreeable to evaluate patient for admission.  The patient appears reasonably stabilized for admission considering the current resources, flow, and capabilities available in the ED at this time, and I doubt any other [facility] requiring further screening and/or treatment in the ED prior to admission.  Clinical Course as of 06/15/24 2208  Mon  Jun 15, 2024  2141 Dr. Keturah with medicine to evaluate for admission [BH]    Clinical Course User Index [BH] Floy Riegler A, PA-C  Medical Decision Making Amount and/or Complexity of Data Reviewed Independent Historian: spouse External Data Reviewed: labs, radiology, ECG and notes. Labs: ordered. Decision-making details documented in ED Course. Radiology: ordered and independent interpretation performed. Decision-making details documented in ED Course. ECG/medicine tests: ordered and independent interpretation performed. Decision-making details documented in ED Course.  Risk OTC drugs. Prescription drug management. Parenteral controlled substances. Decision regarding hospitalization. Diagnosis or treatment significantly limited by social determinants of health.       Final diagnoses:  COVID  Acute respiratory failure with hypoxia [facility])    ED Discharge Orders     None          Reese Stockman A, PA-C 06/15/24 2209    Patsey Lot, MD 06/15/24 2252

## 2024-06-15 NOTE — H&P (Signed)
 History and Physical    Holly [name] FMW:[id number] DOB: May 11, 1961 DOA: 06/15/2024  PCP: Charlott Dorn LABOR, MD   Patient coming from: Home   Chief Complaint:  Chief Complaint  Patient presents with   Shortness of Breath    HPI:  Holly [name] is a 63 y.o. female with hx of paroxysmal atrial fibrillation, CAD by imaging, type II diabetes, obesity, hyperlipidemia, OSA on CPAP, who recently tested positive for COVID 19 on Sat 9/13; presented due to worsening illness with SOB, myalgias, imbalance. Noted she had Sinus infection over the past few weeks which was treated and improved.  However recurred in recent days before Saturday although she does not attribute this to COVID.  Then on Saturday developing shortness of breath but no significant cough.  Diffuse myalgias and feeling of imbalance when she is on her feet, which has improved.  Denies change in p.o. intake, N/V/D.  Has never had COVID in the past.   Review of Systems:  ROS complete and negative except as marked above   Allergies  Allergen Reactions   Cefuroxime Axetil Other (See Comments)    Obsessive Bowel.    Effexor Xr [Venlafaxine Hcl Er] Hives   Sumatriptan     heart issues    Erenumab-Aooe Hives     Smooth Muscle cramping in Esophagus  **Aimovig**   Garlic     Triggers migraines   Onion     Triggers migraines    Statins     myalgia   Zetia  [Ezetimibe ]     Muscle pain   Crestor [Rosuvastatin Calcium] Rash   Triptans Palpitations    Prior to Admission medications   Medication Sig Start Date End Date Taking? Authorizing Provider  acetaminophen  (TYLENOL ) 500 MG tablet Take 500 mg by mouth every 6 (six) hours as needed.   Yes [provider]  apixaban  (ELIQUIS ) 5 MG TABS tablet Take 1 tablet (5 mg total) by mouth 2 (two) times daily. 05/29/24  Yes Lonni Slain, MD  Buprenorphine  HCl-Naloxone  HCl 4-1 MG FILM Place 2 Film under the tongue 2 (two) times daily. 06/22/22  Yes [provider]  Cholecalciferol 125 MCG (5000 UT) capsule Take 5,000 Units by mouth daily.   Yes [provider]  conjugated estrogens (PREMARIN) 0.625 MG/GM vaginal cream Place 1 Applicatorful vaginally daily as needed. 12/06/14  Yes [provider]  DiphenhydrAMINE  HCl (BENADRYL  ALLERGY PO) Take by mouth as needed. Taken with advil  for migraine management.   Yes [provider]  famotidine  (PEPCID ) 20 MG tablet Take 20 mg by mouth daily as needed for heartburn.   Yes [provider]  lansoprazole (PREVACID) 30 MG capsule Take 30 mg by mouth daily as needed (for acid reflux).   Yes [provider]  levothyroxine  (SYNTHROID ) 100 MCG tablet Take 100 mcg by mouth See admin instructions. Take 1 tablet by mouth daily, except on Sunday 07/16/22  Yes [provider]  LORazepam  (ATIVAN ) 2 MG tablet Take 2 mg by mouth 4 (four) times daily.   Yes [provider]  metFORMIN (GLUCOPHAGE) 500 MG tablet Take 1,000 mg by mouth 2 (two) times daily as needed (for blood glucose 170 or greater).   Yes [provider]  OnabotulinumtoxinA (BOTOX IJ) Inject as directed every 3 (three) months. For migraine prevention.   Yes [provider]  glucose blood test strip  01/10/15   [provider]  linaclotide (LINZESS) 145 MCG CAPS capsule TAKE 1 CAP BY MOUTH EVERY  DAY AT LEAST 30 MINUTES BEFORE THE 1ST MAIN MEAL OF DAY ON EMPTY STOMACH Patient not taking: Reported on 06/15/2024    [provider]    Past Medical History:  Diagnosis Date   Anxiety    takes Ativan  daily   Atrial fibrillation (HCC)    Back pain    stenosis and buldging disc   Bone spur    neck and buldging disc   Depression    takes Cymbalta daily   Diabetes (HCC)    takes Metformin daily   Diverticulosis    GERD (gastroesophageal reflux disease)    takes Prevacid daily   History of bronchitis    > 5 yrs ago   History of hiatal hernia     Hypothyroidism    takes Synthroid  daily   IBS (irritable bowel syndrome)    Insomnia    doesn't take any meds   Joint pain    Lung nodule    right middle lobe-was being followed by Dr.Burney.Medical Md is keeping up with this   Migraine    Migraine    last one 08/28/15   Nocturia    Numbness and tingling in hands    PONV (postoperative nausea and vomiting)    Sleep apnea    Thoracic outlet syndrome    TOS (thoracic outlet syndrome)    Urinary urgency     Past Surgical History:  Procedure Laterality Date   ABDOMINAL EXPLORATION SURGERY     cancer cells in cervix   CARPAL TUNNEL RELEASE Right 09/05/2015   Procedure: RIGHT OPEN CARPAL TUNNEL RELEASE, RIGHT LONG FINGER TRIGGER RELEASE,RIGHT INDEX FINGER TRIGGER RELEASE;  Surgeon: Lynwood FORBES Better, MD;  Location: [facility];  Service: Orthopedics;  Laterality: Right;   CARPAL TUNNEL RELEASE Left 08/11/2019   Procedure: LEFT OPEN CARPAL TUNNEL RELEASE AND LEFT LONG FINGER TRIGGER FINGER RELEASE;  Surgeon: Better Lynwood FORBES, MD;  Location: [facility];  Service: Orthopedics;  Laterality: Left;   CHOLECYSTECTOMY     COLONOSCOPY     ESOPHAGOGASTRODUODENOSCOPY     THYROIDECTOMY     TONSILLECTOMY     adenoidectomy   TRIGGER FINGER RELEASE Right 09/05/2015   Procedure: RELEASE TRIGGER FINGER/A-1 PULLEY;  Surgeon: Lynwood FORBES Better, MD;  Location: [facility];  Service: Orthopedics;  Laterality: Right;   TRIGGER FINGER RELEASE Left 08/11/2019   Procedure: RELEASE TRIGGER FINGER/A-1 PULLEY;  Surgeon: Better Lynwood FORBES, MD;  Location: [facility];  Service: Orthopedics;  Laterality: Left;     reports that she has quit smoking. Her smoking use included cigarettes. She has never used smokeless tobacco. She reports that she does not drink alcohol and does not use drugs.  Family History  Problem Relation Age of Onset   COPD Mother    Cancer Mother    Heart disease Maternal Grandmother    Heart disease Maternal Grandfather    Heart  disease Paternal Grandmother    Heart disease Paternal Grandfather      Physical Exam: Vitals:   06/15/24 2100 06/15/24 2115 06/15/24 2131 06/15/24 2246  BP: 115/62 (!) 126/57  121/68  Pulse: (!) 59 63  64  Resp: 12 16    Temp:   99.1 F (37.3 C)   TempSrc:   Oral   SpO2: 100% 100%  98%  Weight:      Height:        Gen: Awake, alert, NAD   CV: Regular, normal S1, S2, no murmurs  Resp: Normal WOB,  slightly coarse but otherwise clear Abd: Flat, normoactive, nontender MSK: Symmetric, no edema  Skin: No rashes or lesions to exposed skin  Neuro: Alert and interactive  Psych: euthymic, appropriate    Data review:   Labs reviewed, notable for:   NA 130, K3.4 Creatinine 1.21 (baseline around 0.8 High-sensitivity Trop negative Lactate 0.8 WBC 2.7, platelet 102, hemoglobin within normal limit D-dimer negative UA small ketone  Micro:  Results for orders placed or performed during the hospital encounter of 08/11/19  Surgical pcr screen     Status: None   Collection Time: 08/07/19 12:14 PM   Specimen: Nasal Mucosa; Nasal Swab  Result Value Ref Range Status   MRSA, PCR NEGATIVE NEGATIVE Final   Staphylococcus aureus NEGATIVE NEGATIVE Final    Comment: (NOTE) The Xpert SA Assay (FDA approved for NASAL specimens in patients 52 years of age and older), is one component of a comprehensive surveillance program. It is not intended to diagnose infection nor to guide or monitor treatment. Performed at [facility], [address], [place], [place] [address]     Imaging reviewed:  CT ABDOMEN PELVIS W CONTRAST Result Date: 06/15/2024 CLINICAL DATA:  Acute abdominal pain radiating to the left, initial encounter EXAM: CT ABDOMEN AND PELVIS WITH CONTRAST TECHNIQUE: Multidetector CT imaging of the abdomen and pelvis was performed using the standard protocol following bolus administration of intravenous contrast. RADIATION DOSE REDUCTION: This exam was performed according to the  departmental dose-optimization program which includes automated exposure control, adjustment of the mA and/or kV according to patient size and/or use of iterative reconstruction technique. CONTRAST:  75mL OMNIPAQUE  IOHEXOL  350 MG/ML SOLN COMPARISON:  07/19/2017 FINDINGS: Lower chest: Lung bases are free of acute infiltrate or sizable effusion. Small parenchymal nodule is noted in the right middle lobe best seen on image number 7 of series 5. This is stable from the prior exam and consistent with a benign etiology. Hepatobiliary: No focal liver abnormality is seen. Status post cholecystectomy. No biliary dilatation. Pancreas: Unremarkable. No pancreatic ductal dilatation or surrounding inflammatory changes. Spleen: Normal in size without focal abnormality. Adrenals/Urinary Tract: Adrenal glands are within normal limits. Kidneys are well visualized bilaterally. No renal calculi or obstructive changes are noted. The bladder is partially distended. Stomach/Bowel: The appendix is within normal limits. No obstructive or inflammatory changes of the colon are seen. Small bowel and stomach are within normal limits. Vascular/Lymphatic: No significant vascular findings are present. No enlarged abdominal or pelvic lymph nodes. Reproductive: Uterus and bilateral adnexa are unremarkable. Other: No abdominal wall hernia or abnormality. No abdominopelvic ascites. Musculoskeletal: No acute or significant osseous findings. IMPRESSION: No acute abnormality is identified correspond with the given clinical history. Stable right middle lobe nodule is seen dating back to 2018 consistent with a benign etiology. Electronically Signed   By: Oneil Devonshire M.D.   On: 06/15/2024 20:49   CT Head Wo Contrast Result Date: 06/15/2024 CLINICAL DATA:  Recent syncopal episode EXAM: CT HEAD WITHOUT CONTRAST TECHNIQUE: Contiguous axial images were obtained from the base of the skull through the vertex without intravenous contrast. RADIATION DOSE  REDUCTION: This exam was performed according to the departmental dose-optimization program which includes automated exposure control, adjustment of the mA and/or kV according to patient size and/or use of iterative reconstruction technique. COMPARISON:  05/05/2016 FINDINGS: Brain: No evidence of acute infarction, hemorrhage, hydrocephalus, extra-axial collection or mass lesion/mass effect. Vascular: No hyperdense vessel or unexpected calcification. Skull: Normal. Negative for fracture or focal lesion. Sinuses/Orbits: No  acute finding. Other: None. IMPRESSION: No acute intracranial abnormality noted. Electronically Signed   By: Oneil Devonshire M.D.   On: 06/15/2024 20:45   DG Chest 2 View Result Date: 06/15/2024 CLINICAL DATA:  Short of breath EXAM: CHEST - 2 VIEW COMPARISON:  None Available. FINDINGS: Normal mediastinum and cardiac silhouette. Normal pulmonary vasculature. No evidence of effusion, infiltrate, or pneumothorax. No acute bony abnormality. IMPRESSION: No acute cardiopulmonary process. Electronically Signed   By: Jackquline Boxer M.D.   On: 06/15/2024 13:58    EKG:  Personally reviewed, sinus rhythm, no acute ischemic changes  ED Course:  Treated with 1 L NS, Tylenol    Assessment/Plan:  63 y.o. female with hx paroxysmal atrial fibrillation, CAD by imaging, type II diabetes, obesity, hyperlipidemia, OSA on CPAP, who recently tested positive for COVID 19 on Sat 9/13; presented due to SOB, myalgias, imbalance. Found to have AHRF related to COVID.    COVID-19 pneumonia, with hypoxia Initial test +9/13. Recent URI -> then developed SOB, myalgia, imbalance.  Desatted to 88%, currently requiring 2 L O2 -> By time of my evaluation she was weaned off O2 and has normal sat on room air at rest.  Chest x-ray without acute infiltrate.  - Dexamethasone  6 mg p.o. daily-> Got dose x1, OK to stop since hypoxia has resolved.  - Hold off on remdesivir other COVID therapy - Albuterol  neb q4 hr prn,  incentive spirometer.  Encourage out of bed to chair - Home O2 desat screen ordered for a.m.  Borderline AKI stage I  Baseline Cr ~ 0.8, up to 1.21 on admission. Likely prerenal in setting of infection  -- S/p 1L IVF, continue oral hydration   Imbalance  Seems to have improved, possible vestibular symptom from viral infection.  -- Road test in AM, if abnormal may need PT   Hypokalemia  -- Repleted   Neutropenia  WBC 2.7, ANC 1.4; suspect in setting of viral infection.  -- Repeat blood counts outpatient.   Incidental findings: Stable RML nodule since 2018 consistent with benign etiology  Chronic medical problems: Paroxysmal A-fib: Currently in SR. Continue on Eliquis , CAD by imaging: On DOAC per above Type 2 diabetes: On metformin OP, SSI for very sensitive while on steroid.  Obesity, class I: Would benefit from weight loss OP  Hyperlipidemia: Not on cholesterol lowering medications.  Hypothyroidism: Continue home levothyroxine   OSA: CPAP nightly Chronic pain: Continue suboxone  BID.  Mood d/o: Continue home ativan  prn   Body mass index is 32.78 kg/m.    DVT prophylaxis:  Eliquis  Code Status:  Full Code Diet:  Diet Orders (From admission, onward)    None      Family Communication:  Yes discussed with husband at bedside   Consults:  None   Admission status:   Inpatient, Telemetry bed  Severity of Illness: The appropriate patient status for this patient is INPATIENT. Inpatient status is judged to be reasonable and necessary in order to provide the required intensity of service to ensure the patient's safety. The patient's presenting symptoms, physical exam findings, and initial radiographic and laboratory data in the context of their chronic comorbidities is felt to place them at high risk for further clinical deterioration. Furthermore, it is not anticipated that the patient will be medically stable for discharge from the hospital within 2 midnights of admission.   * I  certify that at the point of admission it is my clinical judgment that the patient will require inpatient hospital care spanning beyond 2 midnights  from the point of admission due to high intensity of service, high risk for further deterioration and high frequency of surveillance required.*   Dorn Dawson, MD [facility]  How to contact the [facility] Attending or Consulting provider 7A - 7P or covering provider during after hours 7P -7A, for this patient.  Check the care team in [facility] and look for a) attending/consulting [facility] provider listed and b) the [facility] team listed Log into [email or web address] and use [facility]'s universal password to access. If you do not have the password, please contact the hospital operator. Locate the [facility] provider you are looking for under [facility] and page to a number that you can be directly reached. If you still have difficulty reaching the provider, please page the [facility] (Director on Call) for the Hospitalists listed on amion for assistance.  06/15/2024, 10:57 PM

## 2024-06-15 NOTE — Progress Notes (Signed)
 The patient is admitted from ED to 5 N 07. A & O x 4. She denies any acute pain. Full assessment to epic completed with the help of Melissa RN. The patient and her huaband oriented to staff, ascom and all bell. Will continue to monitor.

## 2024-06-15 NOTE — ED Triage Notes (Signed)
 C/o being weak and felt unstready on her feet, was running a fever states she tested positive for covid 09/13 c/o severe lower back pain

## 2024-06-16 DIAGNOSIS — J1282 Pneumonia due to coronavirus disease 2019: Secondary | ICD-10-CM | POA: Diagnosis not present

## 2024-06-16 DIAGNOSIS — U071 COVID-19: Secondary | ICD-10-CM | POA: Diagnosis not present

## 2024-06-16 DIAGNOSIS — N179 Acute kidney failure, unspecified: Secondary | ICD-10-CM | POA: Insufficient documentation

## 2024-06-16 DIAGNOSIS — J9601 Acute respiratory failure with hypoxia: Secondary | ICD-10-CM | POA: Insufficient documentation

## 2024-06-16 LAB — HEMOGLOBIN A1C
Hgb A1c MFr Bld: 6 % — ABNORMAL HIGH (ref 4.8–5.6)
Mean Plasma Glucose: 125.5 mg/dL

## 2024-06-16 LAB — BASIC METABOLIC PANEL WITH GFR
Anion gap: 10 (ref 5–15)
BUN: 12 mg/dL (ref 6–20)
CO2: 24 mmol/L (ref 22–32)
Calcium: 8.8 mg/dL — ABNORMAL LOW (ref 8.9–10.3)
Chloride: 99 mmol/L (ref 98–111)
Creatinine, Ser: 0.75 mg/dL (ref 0.44–1.00)
GFR, Estimated: >60 mL/min
Glucose, Bld: 116 mg/dL — ABNORMAL HIGH (ref 70–99)
Potassium: 4.2 mmol/L (ref 3.5–5.1)
Sodium: 133 mmol/L — ABNORMAL LOW (ref 135–145)

## 2024-06-16 LAB — CBC
HCT: 36.3 % (ref 36.0–46.0)
Hemoglobin: 12.9 g/dL (ref 12.0–15.0)
MCH: 30 pg (ref 26.0–34.0)
MCHC: 35.5 g/dL (ref 30.0–36.0)
MCV: 84.4 fL (ref 80.0–100.0)
Platelets: 109 K/uL — ABNORMAL LOW (ref 150–400)
RBC: 4.3 MIL/uL (ref 3.87–5.11)
RDW: 11.8 % (ref 11.5–15.5)
WBC: 2.6 K/uL — ABNORMAL LOW (ref 4.0–10.5)
nRBC: 0 % (ref 0.0–0.2)

## 2024-06-16 LAB — PHOSPHORUS: Phosphorus: 2.8 mg/dL (ref 2.5–4.6)

## 2024-06-16 LAB — GLUCOSE, CAPILLARY
Glucose-Capillary: 221 mg/dL — ABNORMAL HIGH (ref 70–99)
Glucose-Capillary: 139 mg/dL — ABNORMAL HIGH (ref 70–99)
Glucose-Capillary: 108 mg/dL — ABNORMAL HIGH (ref 70–99)
Glucose-Capillary: 187 mg/dL — ABNORMAL HIGH (ref 70–99)

## 2024-06-16 LAB — MAGNESIUM: Magnesium: 1.9 mg/dL (ref 1.7–2.4)

## 2024-06-16 LAB — HIV ANTIBODY (ROUTINE TESTING W REFLEX): HIV Screen 4th Generation wRfx: NONREACTIVE

## 2024-06-16 MED ORDER — ALBUTEROL SULFATE (2.5 MG/3ML) 0.083% IN NEBU
2.5000 mg | INHALATION_SOLUTION | Freq: Two times a day (BID) | RESPIRATORY_TRACT | Status: DC
  Administered 2024-06-16: 2.5 mg via RESPIRATORY_TRACT
  Filled 2024-06-16: qty 3

## 2024-06-16 NOTE — Progress Notes (Signed)
 Patient refused  her Suboxone . Stated,  I take it once a day and I've already taken it in the morning. Medication wasted in the sharp and was witnessed by [employer]

## 2024-06-16 NOTE — Discharge Instructions (Signed)
 Advised to follow-up with primary care physician in 1 week. Advised to continue supportive care advised to take fluids.

## 2024-06-16 NOTE — Plan of Care (Signed)
  Problem: Education: Goal: Knowledge of General Education information will improve Description: Including pain rating scale, medication(s)/side effects and non-pharmacologic comfort measures Outcome: Progressing   Problem: Clinical Measurements: Goal: Ability to maintain clinical measurements within normal limits will improve Outcome: Progressing Goal: Respiratory complications will improve Outcome: Progressing Goal: Cardiovascular complication will be avoided Outcome: Progressing   Problem: Pain Managment: Goal: General experience of comfort will improve and/or be controlled Outcome: Progressing   Problem: Skin Integrity: Goal: Risk for impaired skin integrity will decrease Outcome: Progressing   Problem: Clinical Measurements: Goal: Will remain free from infection Outcome: Not Progressing

## 2024-06-16 NOTE — Evaluation (Signed)
 Physical Therapy Evaluation Patient Details Name: Floride Hutmacher Liberto MRN: [id number] DOB: 03-Oct-1960 Today's Date: 06/16/2024  History of Present Illness  63 y.o. female presents to [facility] hospital on 06/15/2024 with worsening SOB, myalgias and imbalance. Pt tested positive prior to admission on 9/13. PMH includes PAF, CAD, DMII, HLD, OSA.  Clinical Impression  Pt presents to PT with deficits in functional mobility, gait, balance. Pt reports recent onset of dizziness and instability prior to PT arrival. Brief vestibular screen performed which appears negative. Pt frequently staggers laterally when ambulating in the hallway and has multiple posterior losses of balance within the room which are corrected with stepping strategy. Vitals are stable during evaluation, with sats in mid 90s and without pt reports of significant dyspnea. Pt relays newer onset pain at low back, specifically near area of L kidney. MD made aware of new symptoms and instability/dizziness. PT will continue to follow in an effort to further assess and improve function. Outpatient PT currently recommended at the time of discharge.      If plan is discharge home, recommend the following: A little help with walking and/or transfers;A little help with bathing/dressing/bathroom;Assistance with cooking/housework;Assist for transportation;Help with stairs or ramp for entrance   Can travel by private vehicle        Equipment Recommendations None recommended by PT (encouraged pt to utilize rollator at home if balance deficits persist)  Recommendations for Other Services       Functional Status Assessment Patient has had a recent decline in their functional status and demonstrates the ability to make significant improvements in function in a reasonable and predictable amount of time.     Precautions / Restrictions Precautions Precautions: Fall Recall of Precautions/Restrictions: Intact Precaution/Restrictions Comments: pt reports  dizziness, based on description of symptoms it appears to be more instability than dizziness to this PT Restrictions Weight Bearing Restrictions Per Provider Order: No      Mobility  Bed Mobility               General bed mobility comments: pt received returning to recliner from bathroom    Transfers Overall transfer level: Needs assistance Equipment used: None Transfers: Sit to/from Stand Sit to Stand: Supervision                Ambulation/Gait Ambulation/Gait assistance: Contact guard assist Gait Distance (Feet): 400 Feet Assistive device: None Gait Pattern/deviations: Staggering left, Staggering right Gait velocity: reduced Gait velocity interpretation: 1.31 - 2.62 ft/sec, indicative of limited community ambulator   General Gait Details: pt with multiple losses of balance, staggering prdominantly to left side although PT guarding from R side. Pt appears to better with focusing gaze on a target rather than freely moving eyes during ambulation.  Stairs            Wheelchair Mobility     Tilt Bed    Modified Rankin (Stroke Patients Only)       Balance Overall balance assessment: Needs assistance Sitting-balance support: No upper extremity supported, Feet supported Sitting balance-Leahy Scale: Good     Standing balance support: No upper extremity supported, During functional activity Standing balance-Leahy Scale: Fair                               Pertinent Vitals/Pain Pain Assessment Pain Assessment: Faces Faces Pain Scale: Hurts even more Pain Location: low back, L kidney area Pain Descriptors / Indicators: Grimacing Pain Intervention(s): Monitored during session  Home Living Family/patient expects to be discharged to:: Private residence Living Arrangements: Spouse/significant other Available Help at Discharge: Family;Available PRN/intermittently Type of Home: House Home Access: Ramped entrance       Home Layout: One  level Home Equipment: Rollator (4 wheels);Cane - single point      Prior Function Prior Level of Function : Independent/Modified Independent;Driving             Mobility Comments: caring for her father at home       Extremity/Trunk Assessment   Upper Extremity Assessment Upper Extremity Assessment: Overall WFL for tasks assessed    Lower Extremity Assessment Lower Extremity Assessment: Generalized weakness    Cervical / Trunk Assessment Cervical / Trunk Assessment: Normal  Communication   Communication Communication: No apparent difficulties    Cognition Arousal: Alert Behavior During Therapy: WFL for tasks assessed/performed   PT - Cognitive impairments: No apparent impairments                         Following commands: Intact       Cueing Cueing Techniques: Verbal cues     General Comments General comments (skin integrity, edema, etc.): VSS on RA, sats 94-96%. BP 131/72. PT performs brief vestibular screen, pursuits, saccades, VOR all within functional limits and do not provoke symptoms. Pt reports symptoms of dizziness began when seated in recliner, without any preceding mobility.    Exercises     Assessment/Plan    PT Assessment Patient needs continued PT services  PT Problem List Decreased strength;Decreased balance;Decreased mobility;Decreased activity tolerance;Decreased knowledge of use of DME       PT Treatment Interventions DME instruction;Gait training;Stair training;Functional mobility training;Therapeutic activities;Therapeutic exercise;Balance training;Neuromuscular re-education;Patient/family education    PT Goals (Current goals can be found in the Care Plan section)  Acute Rehab PT Goals Patient Stated Goal: to return to independence, improve balance PT Goal Formulation: With patient Time For Goal Achievement: 06/30/24 Potential to Achieve Goals: Good    Frequency Min 3X/week     Co-evaluation                AM-PAC PT 6 Clicks Mobility  Outcome Measure Help needed turning from your back to your side while in a flat bed without using bedrails?: A Little Help needed moving from lying on your back to sitting on the side of a flat bed without using bedrails?: A Little Help needed moving to and from a bed to a chair (including a wheelchair)?: A Little Help needed standing up from a chair using your arms (e.g., wheelchair or bedside chair)?: A Little Help needed to walk in hospital room?: A Little Help needed climbing 3-5 steps with a railing? : A Little 6 Click Score: 18    End of Session   Activity Tolerance: Patient tolerated treatment well Patient left: in chair;with call bell/phone within reach Nurse Communication: Mobility status PT Visit Diagnosis: Other abnormalities of gait and mobility (R26.89);Dizziness and giddiness (R42)    Time: 1111-1201 PT Time Calculation (min) (ACUTE ONLY): 50 min   Charges:   PT Evaluation $PT Eval Low Complexity: 1 Low   PT General Charges $$ ACUTE PT VISIT: 1 Visit         Bernardino JINNY Ruth, PT, DPT Acute Rehabilitation Office [phone]   Bernardino JINNY Ruth 06/16/2024, 12:38 PM

## 2024-06-16 NOTE — Discharge Summary (Signed)
 Physician Discharge Summary  Holly [name] FMW:[id number] DOB: 01-Feb-1961 DOA: 06/15/2024  PCP: Charlott Dorn LABOR, MD  Admit date: 06/15/2024  Discharge date: 06/16/2024  Admitted From: Home  Disposition:  Home  Recommendations for Outpatient Follow-up:  Follow up with PCP in 1-2 weeks. Please obtain BMP/CBC in one week Advised to continue supportive care,  advised to take fluids.  Home Health:None Equipment/Devices:None  Discharge Condition: Stable CODE STATUS:Full code Diet recommendation: Heart Healthy   Brief [facility] Course: This 63 yrs old female with PMH significant for paroxysmal atrial fibrillation, CAD by imaging, type II diabetes, obesity, hyperlipidemia, OSA on CPAP, who recently tested positive for COVID 19 on Sat 06/13/24; presented due to worsening illness with SOB, myalgias, imbalance. She reports Sinus infection over the past few weeks which was treated and improved.  However recurred in recent days before Saturday although she does not attribute this to COVID.  Then on Saturday developing shortness of breath but no significant cough.  Diffuse myalgias and feeling of imbalance when she is on her feet, which has improved.  She never had  COVID in the past.  Patient was admitted for further evaluation. CT head negative for acute intracranial abnormality, CT abdomen negative for acute abnormalities.  All the lab work is improved.  Patient successfully weaned down to room air.  Patient felt much improved and she wanted to be discharged.  Patient was discharged but she had an episode of dizziness, imbalance, reports having left-sided flank pain.  Explained to the patient it could be related to muscle, CT was negative.  Patient states she does not feel comfortable going home today.   Discharge Diagnoses:  Principal Problem:   Pneumonia due to COVID-19 virus Active Problems:   Acute hypoxic respiratory failure (HCC)   AKI (acute kidney injury) [facility])  Discharge  Instructions  Discharge Instructions     Call MD for:  persistant dizziness or light-headedness   Complete by: As directed    Call MD for:  persistant nausea and vomiting   Complete by: As directed    Call MD for:  redness, tenderness, or signs of infection (pain, swelling, redness, odor or green/yellow discharge around incision site)   Complete by: As directed    Diet - low sodium heart healthy   Complete by: As directed    Diet general   Complete by: As directed    Discharge instructions   Complete by: As directed    Advised to follow-up with primary care physician in 1 week. Advised to continue supportive care advised to take fluids.   Increase activity slowly   Complete by: As directed       Allergies as of 06/16/2024       Reactions   Cefuroxime Axetil Other (See Comments)   Obsessive Bowel.    Effexor Xr [venlafaxine Hcl Er] Hives   Sumatriptan    heart issues   Erenumab-aooe Hives    Smooth Muscle cramping in Esophagus  **Aimovig**   Garlic    Triggers migraines   Onion    Triggers migraines   Statins    myalgia   Zetia  [ezetimibe ]    Muscle pain   Crestor [rosuvastatin Calcium] Rash   Triptans Palpitations        Medication List     TAKE these medications    acetaminophen  500 MG tablet Commonly known as: TYLENOL  Take 500 mg by mouth every 6 (six) hours as needed.   apixaban  5 MG Tabs tablet Commonly known  as: Eliquis  Take 1 tablet (5 mg total) by mouth 2 (two) times daily.   BENADRYL  ALLERGY PO Take by mouth as needed. Taken with advil  for migraine management.   BOTOX IJ Inject as directed every 3 (three) months. For migraine prevention.   Buprenorphine  HCl-Naloxone  HCl 4-1 MG Film Place 2 Film under the tongue 2 (two) times daily.   Cholecalciferol 125 MCG (5000 UT) capsule Take 5,000 Units by mouth daily.   conjugated estrogens 0.625 MG/GM vaginal cream Commonly known as: PREMARIN Place 1 Applicatorful vaginally daily as needed.    famotidine  20 MG tablet Commonly known as: PEPCID  Take 20 mg by mouth daily as needed for heartburn.   glucose blood test strip   lansoprazole 30 MG capsule Commonly known as: PREVACID Take 30 mg by mouth daily as needed (for acid reflux).   levothyroxine  100 MCG tablet Commonly known as: SYNTHROID  Take 100 mcg by mouth See admin instructions. Take 1 tablet by mouth daily, except on Sunday   Linzess 145 MCG Caps capsule Generic drug: linaclotide TAKE 1 CAP BY MOUTH EVERY DAY AT LEAST 30 MINUTES BEFORE THE 1ST MAIN MEAL OF DAY ON EMPTY STOMACH   LORazepam  2 MG tablet Commonly known as: ATIVAN  Take 2 mg by mouth 4 (four) times daily.   metFORMIN 500 MG tablet Commonly known as: GLUCOPHAGE Take 1,000 mg by mouth 2 (two) times daily as needed (for blood glucose 170 or greater).        Follow-up Information     Charlott Dorn LABOR, MD Follow up in 1 week(s).   Specialty: Internal Medicine Contact information: [address] [place] [address] [phone]                Allergies  Allergen Reactions   Cefuroxime Axetil Other (See Comments)    Obsessive Bowel.    Effexor Xr [Venlafaxine Hcl Er] Hives   Sumatriptan     heart issues    Erenumab-Aooe Hives     Smooth Muscle cramping in Esophagus  **Aimovig**   Garlic     Triggers migraines   Onion     Triggers migraines    Statins     myalgia   Zetia  [Ezetimibe ]     Muscle pain   Crestor [Rosuvastatin Calcium] Rash   Triptans Palpitations    Consultations: None   Procedures/Studies: CT ABDOMEN PELVIS W CONTRAST Result Date: 06/15/2024 CLINICAL DATA:  Acute abdominal pain radiating to the left, initial encounter EXAM: CT ABDOMEN AND PELVIS WITH CONTRAST TECHNIQUE: Multidetector CT imaging of the abdomen and pelvis was performed using the standard protocol following bolus administration of intravenous contrast. RADIATION DOSE REDUCTION: This exam was performed according to the  departmental dose-optimization program which includes automated exposure control, adjustment of the mA and/or kV according to patient size and/or use of iterative reconstruction technique. CONTRAST:  75mL OMNIPAQUE  IOHEXOL  350 MG/ML SOLN COMPARISON:  07/19/2017 FINDINGS: Lower chest: Lung bases are free of acute infiltrate or sizable effusion. Small parenchymal nodule is noted in the right middle lobe best seen on image number 7 of series 5. This is stable from the prior exam and consistent with a benign etiology. Hepatobiliary: No focal liver abnormality is seen. Status post cholecystectomy. No biliary dilatation. Pancreas: Unremarkable. No pancreatic ductal dilatation or surrounding inflammatory changes. Spleen: Normal in size without focal abnormality. Adrenals/Urinary Tract: Adrenal glands are within normal limits. Kidneys are well visualized bilaterally. No renal calculi or obstructive changes are noted. The bladder is partially  distended. Stomach/Bowel: The appendix is within normal limits. No obstructive or inflammatory changes of the colon are seen. Small bowel and stomach are within normal limits. Vascular/Lymphatic: No significant vascular findings are present. No enlarged abdominal or pelvic lymph nodes. Reproductive: Uterus and bilateral adnexa are unremarkable. Other: No abdominal wall hernia or abnormality. No abdominopelvic ascites. Musculoskeletal: No acute or significant osseous findings. IMPRESSION: No acute abnormality is identified correspond with the given clinical history. Stable right middle lobe nodule is seen dating back to 2018 consistent with a benign etiology. Electronically Signed   By: Oneil Devonshire M.D.   On: 06/15/2024 20:49   CT Head Wo Contrast Result Date: 06/15/2024 CLINICAL DATA:  Recent syncopal episode EXAM: CT HEAD WITHOUT CONTRAST TECHNIQUE: Contiguous axial images were obtained from the base of the skull through the vertex without intravenous contrast. RADIATION DOSE  REDUCTION: This exam was performed according to the departmental dose-optimization program which includes automated exposure control, adjustment of the mA and/or kV according to patient size and/or use of iterative reconstruction technique. COMPARISON:  05/05/2016 FINDINGS: Brain: No evidence of acute infarction, hemorrhage, hydrocephalus, extra-axial collection or mass lesion/mass effect. Vascular: No hyperdense vessel or unexpected calcification. Skull: Normal. Negative for fracture or focal lesion. Sinuses/Orbits: No acute finding. Other: None. IMPRESSION: No acute intracranial abnormality noted. Electronically Signed   By: Oneil Devonshire M.D.   On: 06/15/2024 20:45   DG Chest 2 View Result Date: 06/15/2024 CLINICAL DATA:  Short of breath EXAM: CHEST - 2 VIEW COMPARISON:  None Available. FINDINGS: Normal mediastinum and cardiac silhouette. Normal pulmonary vasculature. No evidence of effusion, infiltrate, or pneumothorax. No acute bony abnormality. IMPRESSION: No acute cardiopulmonary process. Electronically Signed   By: Jackquline Boxer M.D.   On: 06/15/2024 13:58    Subjective: Patient was seen and examined at bedside.  Overnight events noted. Patient reports doing much better and want to be discharged. An hour later patient had an episode of imbalance, dizziness. She wants to be discharged tomorrow.  Discharge Exam: Vitals:   06/16/24 0730 06/16/24 1534  BP: (!) 112/49 (!) 116/57  Pulse: 61 (!) 51  Resp: 16 16  Temp: 98 F (36.7 C) 97.8 F (36.6 C)  SpO2: 94% 97%   Vitals:   06/16/24 0029 06/16/24 0338 06/16/24 0730 06/16/24 1534  BP: 98/80 (!) 126/57 (!) 112/49 (!) 116/57  Pulse: 74 69 61 (!) 51  Resp: 18  16 16   Temp: 99.6 F (37.6 C) 99.3 F (37.4 C) 98 F (36.7 C) 97.8 F (36.6 C)  TempSrc: Oral     SpO2: 97% 94% 94% 97%  Weight:      Height:        General: Pt is alert, awake, not in acute distress Cardiovascular: RRR, S1/S2 +, no rubs, no gallops Respiratory: CTA  bilaterally, no wheezing, no rhonchi Abdominal: Soft, NT, ND, bowel sounds + Extremities: no edema, no cyanosis    The results of significant diagnostics from this hospitalization (including imaging, microbiology, ancillary and laboratory) are listed below for reference.     Microbiology: Recent Results (from the past 240 hours)  Blood culture (routine x 2)     Status: None (Preliminary result)   Collection Time: 06/15/24  7:00 PM   Specimen: BLOOD  Result Value Ref Range Status   Specimen Description BLOOD LEFT ANTECUBITAL  Final   Special Requests   Final    BOTTLES DRAWN AEROBIC AND ANAEROBIC Blood Culture results may not be optimal due to an inadequate volume  of blood received in culture bottles   Culture   Final    NO GROWTH < 24 HOURS Performed at [facility], [address], [place], [place] [address]    Report Status PENDING  Incomplete  Blood culture (routine x 2)     Status: None (Preliminary result)   Collection Time: 06/15/24  8:00 PM   Specimen: BLOOD  Result Value Ref Range Status   Specimen Description BLOOD SITE NOT SPECIFIED  Final   Special Requests   Final    BOTTLES DRAWN AEROBIC AND ANAEROBIC Blood Culture results may not be optimal due to an inadequate volume of blood received in culture bottles   Culture   Final    NO GROWTH < 12 HOURS Performed at [facility], [address], [place], [place] [address]    Report Status PENDING  Incomplete     Labs: BNP (last 3 results) No results for input(s): BNP in the last 8760 hours. Basic Metabolic Panel: Recent Labs  Lab 06/15/24 1150 06/15/24 1216 06/16/24 1431  NA 130* 131* 133*  K 3.4* 3.4* 4.2  CL 95* 94* 99  CO2 24  --  24  GLUCOSE 123* 122* 116*  BUN 14 15 12   CREATININE 1.21* 1.10* 0.75  CALCIUM 8.7*  --  8.8*  MG  --   --  1.9  PHOS  --   --  2.8   Liver Function Tests: No results for input(s): AST, ALT, ALKPHOS, BILITOT, PROT, ALBUMIN in the last 168  hours. No results for input(s): LIPASE, AMYLASE in the last 168 hours. No results for input(s): AMMONIA in the last 168 hours. CBC: Recent Labs  Lab 06/15/24 1150 06/15/24 1216 06/16/24 1431  WBC 2.7*  --  2.6*  NEUTROABS 1.4*  --   --   HGB 12.7 12.6 12.9  HCT 37.0 37.0 36.3  MCV 87.1  --  84.4  PLT 102*  --  109*   Cardiac Enzymes: No results for input(s): CKTOTAL, CKMB, CKMBINDEX, TROPONINI in the last 168 hours. BNP: Invalid input(s): POCBNP CBG: Recent Labs  Lab 06/16/24 0637 06/16/24 1121  GLUCAP 221* 139*   D-Dimer Recent Labs    06/15/24 1845  DDIMER 0.30   Hgb A1c Recent Labs    06/16/24 0457  HGBA1C 6.0*   Lipid Profile No results for input(s): CHOL, HDL, LDLCALC, TRIG, CHOLHDL, LDLDIRECT in the last 72 hours. Thyroid  function studies No results for input(s): TSH, T4TOTAL, T3FREE, THYROIDAB in the last 72 hours.  Invalid input(s): FREET3 Anemia work up No results for input(s): VITAMINB12, FOLATE, FERRITIN, TIBC, IRON, RETICCTPCT in the last 72 hours. Urinalysis    Component Value Date/Time   COLORURINE YELLOW 06/15/2024 1716   APPEARANCEUR CLEAR 06/15/2024 1716   LABSPEC 1.009 06/15/2024 1716   PHURINE 5.0 06/15/2024 1716   GLUCOSEU NEGATIVE 06/15/2024 1716   HGBUR NEGATIVE 06/15/2024 1716   BILIRUBINUR NEGATIVE 06/15/2024 1716   KETONESUR 5 (A) 06/15/2024 1716   PROTEINUR NEGATIVE 06/15/2024 1716   NITRITE NEGATIVE 06/15/2024 1716   LEUKOCYTESUR NEGATIVE 06/15/2024 1716   Sepsis Labs Recent Labs  Lab 06/15/24 1150 06/16/24 1431  WBC 2.7* 2.6*   Microbiology Recent Results (from the past 240 hours)  Blood culture (routine x 2)     Status: None (Preliminary result)   Collection Time: 06/15/24  7:00 PM   Specimen: BLOOD  Result Value Ref Range Status   Specimen Description BLOOD LEFT ANTECUBITAL  Final   Special  Requests   Final    BOTTLES DRAWN AEROBIC AND ANAEROBIC Blood Culture  results may not be optimal due to an inadequate volume of blood received in culture bottles   Culture   Final    NO GROWTH < 24 HOURS Performed at [facility], [address], [place], [place] [address]    Report Status PENDING  Incomplete  Blood culture (routine x 2)     Status: None (Preliminary result)   Collection Time: 06/15/24  8:00 PM   Specimen: BLOOD  Result Value Ref Range Status   Specimen Description BLOOD SITE NOT SPECIFIED  Final   Special Requests   Final    BOTTLES DRAWN AEROBIC AND ANAEROBIC Blood Culture results may not be optimal due to an inadequate volume of blood received in culture bottles   Culture   Final    NO GROWTH < 12 HOURS Performed at [facility], [address] [place], [place] [address]    Report Status PENDING  Incomplete     Time coordinating discharge: Over 30 minutes  SIGNED:   Darcel Dawley, MD  [facility] 06/16/2024, 3:56 PM Pager   If 7PM-7AM, please contact night-coverage

## 2024-06-16 NOTE — Progress Notes (Signed)
   06/16/24 2038  BiPAP/CPAP/SIPAP  Reason BIPAP/CPAP not in use Non-compliant   Pt refused CPAP for nighttime use.

## 2024-06-17 ENCOUNTER — Other Ambulatory Visit: Payer: Self-pay | Admitting: Family Medicine

## 2024-06-17 DIAGNOSIS — J1282 Pneumonia due to coronavirus disease 2019: Secondary | ICD-10-CM | POA: Diagnosis not present

## 2024-06-17 DIAGNOSIS — U071 COVID-19: Secondary | ICD-10-CM | POA: Diagnosis not present

## 2024-06-17 LAB — GLUCOSE, CAPILLARY: Glucose-Capillary: 114 mg/dL — ABNORMAL HIGH (ref 70–99)

## 2024-06-17 NOTE — Plan of Care (Signed)

## 2024-06-17 NOTE — Progress Notes (Signed)
 DISCHARGE NOTE HOME Holly [name] to be discharged Home per MD order. Discussed prescriptions and follow up appointments with the patient. Prescriptions given to patient; medication list explained in detail. Patient verbalized understanding.  Skin clean, dry and intact without evidence of skin break down, no evidence of skin tears noted. IV catheter discontinued intact. Site without signs and symptoms of complications. Dressing and pressure applied. Pt denies pain at the site currently. No complaints noted.  Patient free of lines, drains, and wounds.   An After Visit Summary (AVS) was printed and given to the patient. Patient escorted via wheelchair, and discharged home via private auto.  Peyton SHAUNNA Pepper, RN

## 2024-06-17 NOTE — Discharge Summary (Signed)
 Physician Discharge Summary   Patient: Holly [name] MRN: [id number] DOB: 1961/09/02  Admit date:     06/15/2024  Discharge date: 06/17/24  Discharge Physician: Lonni SHAUNNA Dalton   PCP: Charlott Dorn LABOR, MD     Recommendations at discharge:  Follow up with PCP Dr. Charlott in 1 week Dr. Charlott: Please repeat CBC in 1 week (discharge WBC 2K and Platelets 100K)     Discharge Diagnoses: Principal Problem:   Pneumonia due to COVID-19 virus Active Problems:   Acute hypoxic respiratory failure ruled out   AKI (acute kidney injury) ruled out   Leukopenia   Thrombocytopenia   Obesity, Class 1   Paroxysmal atrial fibrillation   Coronary artery disease   Diabetes   Hyperlipidemia   Sleep apnea    Hospital Course: 63 y.o. F with obesity, A-fib, CAD, OSA, and DM presented with flulike illness, found to have COVID.   Acute COVID Admitted and observed.  Had O2 requirement on admission.  Now fever free, mentating at baseline, taking orals.  Heart rate < 100bpm, RR < 24, SpO2 at baseline.   Stable for discharge.    Thrombocytopenia Leukopenia Likely due to COVID.  Recommend follow up with PCP   Gait imbalance On admission, CTH normal, symptoms intermittent, not consistent with stroke.  On my evaluation, no evidence of ataxia, nystagmus or focal weakness.           The [place]  Controlled Substances Registry was reviewed for this patient prior to discharge.  Consultants: None Procedures performed: None  Disposition: Home Diet recommendation:  Discharge Diet Orders (From admission, onward)     Start     Ordered   06/16/24 0000  Diet general        06/16/24 1029             DISCHARGE MEDICATION: Allergies as of 06/17/2024       Reactions   Cefuroxime Axetil Other (See Comments)   Obsessive Bowel.    Effexor Xr [venlafaxine Hcl Er] Hives   Sumatriptan    heart issues   Erenumab-aooe Hives    Smooth Muscle cramping in Esophagus   **Aimovig**   Garlic    Triggers migraines   Onion    Triggers migraines   Statins    myalgia   Zetia  [ezetimibe ]    Muscle pain   Crestor [rosuvastatin Calcium] Rash   Triptans Palpitations        Medication List     TAKE these medications    acetaminophen  500 MG tablet Commonly known as: TYLENOL  Take 500 mg by mouth every 6 (six) hours as needed.   apixaban  5 MG Tabs tablet Commonly known as: Eliquis  Take 1 tablet (5 mg total) by mouth 2 (two) times daily.   BENADRYL  ALLERGY PO Take by mouth as needed. Taken with advil  for migraine management.   BOTOX IJ Inject as directed every 3 (three) months. For migraine prevention.   Buprenorphine  HCl-Naloxone  HCl 4-1 MG Film Place 2 Film under the tongue 2 (two) times daily.   Cholecalciferol 125 MCG (5000 UT) capsule Take 5,000 Units by mouth daily.   conjugated estrogens 0.625 MG/GM vaginal cream Commonly known as: PREMARIN Place 1 Applicatorful vaginally daily as needed.   famotidine  20 MG tablet Commonly known as: PEPCID  Take 20 mg by mouth daily as needed for heartburn.   glucose blood test strip   lansoprazole 30 MG capsule Commonly known as: PREVACID Take 30 mg by mouth daily as needed (for  acid reflux).   levothyroxine  100 MCG tablet Commonly known as: SYNTHROID  Take 100 mcg by mouth See admin instructions. Take 1 tablet by mouth daily, except on Sunday   Linzess 145 MCG Caps capsule Generic drug: linaclotide TAKE 1 CAP BY MOUTH EVERY DAY AT LEAST 30 MINUTES BEFORE THE 1ST MAIN MEAL OF DAY ON EMPTY STOMACH   LORazepam  2 MG tablet Commonly known as: ATIVAN  Take 2 mg by mouth 4 (four) times daily.   metFORMIN 500 MG tablet Commonly known as: GLUCOPHAGE Take 1,000 mg by mouth 2 (two) times daily as needed (for blood glucose 170 or greater).        Follow-up Information     Charlott Dorn LABOR, MD Follow up in 1 week(s).   Specialty: Internal Medicine Contact information: [address] [place] [address] [phone]                 Discharge Instructions     Call MD for:  persistant dizziness or light-headedness   Complete by: As directed    Call MD for:  persistant nausea and vomiting   Complete by: As directed    Call MD for:  redness, tenderness, or signs of infection (pain, swelling, redness, odor or green/yellow discharge around incision site)   Complete by: As directed    Diet general   Complete by: As directed    Discharge instructions   Complete by: As directed    **IMPORTANT DISCHARGE INSTRUCTIONS**   From Dr. Jonel: You were admitted for COVID  Here, you had a chest x-ray that was clear. You had a CT of the head that showed no signs of mass or stroke You had a CT of the abdomen that was also reassuring.  Your d-dimer and heart enzymes were normal, showing no signs of blood clots or heart attack  Your A1c is at goal  Your blood counts are low, likely due to COVID.  Please have Dr. Charlott check these in 1-2 weeks  Take it easy the next few days, drink PLENTY of fluids  For cough: Take dextromethorphan (this is in robitussin and Delsym)  To loosen phlegm: Take guaifenesin (this is in robitussin and Mucinex)  Go see Dr. Charlott in 1 week  Isolate for 5 days from your last fever (on admission)   Increase activity slowly   Complete by: As directed        Discharge Exam: Filed Weights   06/15/24 1050  Weight: 89.4 kg    General: Pt is alert, awake, not in acute distress Cardiovascular: RRR, nl S1-S2, no murmurs appreciated.   No LE edema.   Respiratory: Normal respiratory rate and rhythm.  CTAB without rales or wheezes. Abdominal: Abdomen soft and non-tender.  No distension or HSM.   Neuro/Psych: Strength symmetric in upper and lower extremities.  Judgment and insight appear normal.   Condition at discharge: good  The results of significant diagnostics from this hospitalization (including imaging,  microbiology, ancillary and laboratory) are listed below for reference.   Imaging Studies: CT ABDOMEN PELVIS W CONTRAST Result Date: 06/15/2024 CLINICAL DATA:  Acute abdominal pain radiating to the left, initial encounter EXAM: CT ABDOMEN AND PELVIS WITH CONTRAST TECHNIQUE: Multidetector CT imaging of the abdomen and pelvis was performed using the standard protocol following bolus administration of intravenous contrast. RADIATION DOSE REDUCTION: This exam was performed according to the departmental dose-optimization program which includes automated exposure control, adjustment of the mA and/or kV according to patient size  and/or use of iterative reconstruction technique. CONTRAST:  75mL OMNIPAQUE  IOHEXOL  350 MG/ML SOLN COMPARISON:  07/19/2017 FINDINGS: Lower chest: Lung bases are free of acute infiltrate or sizable effusion. Small parenchymal nodule is noted in the right middle lobe best seen on image number 7 of series 5. This is stable from the prior exam and consistent with a benign etiology. Hepatobiliary: No focal liver abnormality is seen. Status post cholecystectomy. No biliary dilatation. Pancreas: Unremarkable. No pancreatic ductal dilatation or surrounding inflammatory changes. Spleen: Normal in size without focal abnormality. Adrenals/Urinary Tract: Adrenal glands are within normal limits. Kidneys are well visualized bilaterally. No renal calculi or obstructive changes are noted. The bladder is partially distended. Stomach/Bowel: The appendix is within normal limits. No obstructive or inflammatory changes of the colon are seen. Small bowel and stomach are within normal limits. Vascular/Lymphatic: No significant vascular findings are present. No enlarged abdominal or pelvic lymph nodes. Reproductive: Uterus and bilateral adnexa are unremarkable. Other: No abdominal wall hernia or abnormality. No abdominopelvic ascites. Musculoskeletal: No acute or significant osseous findings. IMPRESSION: No acute  abnormality is identified correspond with the given clinical history. Stable right middle lobe nodule is seen dating back to 2018 consistent with a benign etiology. Electronically Signed   By: Oneil Devonshire M.D.   On: 06/15/2024 20:49   CT Head Wo Contrast Result Date: 06/15/2024 CLINICAL DATA:  Recent syncopal episode EXAM: CT HEAD WITHOUT CONTRAST TECHNIQUE: Contiguous axial images were obtained from the base of the skull through the vertex without intravenous contrast. RADIATION DOSE REDUCTION: This exam was performed according to the departmental dose-optimization program which includes automated exposure control, adjustment of the mA and/or kV according to patient size and/or use of iterative reconstruction technique. COMPARISON:  05/05/2016 FINDINGS: Brain: No evidence of acute infarction, hemorrhage, hydrocephalus, extra-axial collection or mass lesion/mass effect. Vascular: No hyperdense vessel or unexpected calcification. Skull: Normal. Negative for fracture or focal lesion. Sinuses/Orbits: No acute finding. Other: None. IMPRESSION: No acute intracranial abnormality noted. Electronically Signed   By: Oneil Devonshire M.D.   On: 06/15/2024 20:45   DG Chest 2 View Result Date: 06/15/2024 CLINICAL DATA:  Short of breath EXAM: CHEST - 2 VIEW COMPARISON:  None Available. FINDINGS: Normal mediastinum and cardiac silhouette. Normal pulmonary vasculature. No evidence of effusion, infiltrate, or pneumothorax. No acute bony abnormality. IMPRESSION: No acute cardiopulmonary process. Electronically Signed   By: Jackquline Boxer M.D.   On: 06/15/2024 13:58    Microbiology: Results for orders placed or performed during the hospital encounter of 06/15/24  Blood culture (routine x 2)     Status: None (Preliminary result)   Collection Time: 06/15/24  7:00 PM   Specimen: BLOOD  Result Value Ref Range Status   Specimen Description BLOOD LEFT ANTECUBITAL  Final   Special Requests   Final    BOTTLES DRAWN AEROBIC  AND ANAEROBIC Blood Culture results may not be optimal due to an inadequate volume of blood received in culture bottles   Culture   Final    NO GROWTH 2 DAYS Performed at [facility], [address], [place], [place] [address]    Report Status PENDING  Incomplete  Blood culture (routine x 2)     Status: None (Preliminary result)   Collection Time: 06/15/24  8:00 PM   Specimen: BLOOD  Result Value Ref Range Status   Specimen Description BLOOD SITE NOT SPECIFIED  Final   Special Requests   Final    BOTTLES DRAWN AEROBIC AND ANAEROBIC  Blood Culture results may not be optimal due to an inadequate volume of blood received in culture bottles   Culture   Final    NO GROWTH 2 DAYS Performed at [facility], [address] [place], [place] [address]    Report Status PENDING  Incomplete    Labs: CBC: Recent Labs  Lab 06/15/24 1150 06/15/24 1216 06/16/24 1431  WBC 2.7*  --  2.6*  NEUTROABS 1.4*  --   --   HGB 12.7 12.6 12.9  HCT 37.0 37.0 36.3  MCV 87.1  --  84.4  PLT 102*  --  109*   Basic Metabolic Panel: Recent Labs  Lab 06/15/24 1150 06/15/24 1216 06/16/24 1431  NA 130* 131* 133*  K 3.4* 3.4* 4.2  CL 95* 94* 99  CO2 24  --  24  GLUCOSE 123* 122* 116*  BUN 14 15 12   CREATININE 1.21* 1.10* 0.75  CALCIUM 8.7*  --  8.8*  MG  --   --  1.9  PHOS  --   --  2.8   Liver Function Tests: No results for input(s): AST, ALT, ALKPHOS, BILITOT, PROT, ALBUMIN in the last 168 hours. CBG: Recent Labs  Lab 06/16/24 0637 06/16/24 1121 06/16/24 1614 06/16/24 2106 06/17/24 0633  GLUCAP 221* 139* 108* 187* 114*    Discharge time spent: approximately 45 minutes spent on discharge counseling, evaluation of patient on day of discharge, and coordination of discharge planning with nursing, social work, pharmacy and case management  Signed: Lonni SHAUNNA Dalton, MD [facility] 06/17/2024

## 2024-06-17 NOTE — Progress Notes (Signed)
 Physical Therapy Treatment Patient Details Name: Holly [name] MRN: [id number] DOB: 1961-09-24 Today's Date: 06/17/2024   History of Present Illness 63 y.o. female presents to [facility] hospital on 06/15/2024 with worsening SOB, myalgias and imbalance. Pt tested positive prior to admission on 9/13. PMH includes PAF, CAD, DMII, HLD, OSA.    PT Comments  Pt admitted with above diagnosis. Pt with left anterior canal BPPV and treated with Epley maneuver.  Pt felt better and states she was more steady with gait.  Pt to go home and use rollator prn. Given HEP for Self Epley and Holly [name] exercises to perform prn and educated with teach back for each exercise.  Pt d/c pending and states she will f/u with Outpt PT if symptoms of dizziness continue to persist.  Pt currently with functional limitations due to the deficits listed below (see PT Problem List). Pt will benefit from acute skilled PT to increase their independence and safety with mobility to allow discharge.       If plan is discharge home, recommend the following: A little help with bathing/dressing/bathroom;Assistance with cooking/housework;Assist for transportation;Help with stairs or ramp for entrance   Can travel by private vehicle        Equipment Recommendations  None recommended by PT (encouraged pt to utilize rollator at home if balance deficits persist)    Recommendations for Other Services       Precautions / Restrictions Precautions Precautions: Fall Recall of Precautions/Restrictions: Intact Restrictions Weight Bearing Restrictions Per Provider Order: No     Mobility  Bed Mobility               General bed mobility comments: pt received with pt standing EOB    Transfers Overall transfer level: Needs assistance Equipment used: None Transfers: Sit to/from Stand Sit to Stand: Independent                Ambulation/Gait Ambulation/Gait assistance: Supervision Gait Distance (Feet): 50 Feet Assistive  device: None Gait Pattern/deviations: WFL(Within Functional Limits) Gait velocity: reduced     General Gait Details: No LOB today with pt turning and with min challenges. Did discuss caution with longer distances as dizziness can persist and pt agreed to be cautious until she knows BPPV is diminished. She states she will use rollator as needed.   Stairs             Wheelchair Mobility     Tilt Bed    Modified Rankin (Stroke Patients Only)       Balance Overall balance assessment: Needs assistance Sitting-balance support: No upper extremity supported, Feet supported Sitting balance-Leahy Scale: Good     Standing balance support: No upper extremity supported, During functional activity Standing balance-Leahy Scale: Fair                              Hotel manager: No apparent difficulties  Cognition Arousal: Alert Behavior During Therapy: WFL for tasks assessed/performed   PT - Cognitive impairments: No apparent impairments                         Following commands: Intact      Cueing Cueing Techniques: Verbal cues  Exercises Other Exercises Other Exercises: Access Code: [email or web address]  URL: [email or web address]  Date: 06/17/2024  Prepared by: Stephane    Exercises  - Self-Epley Maneuver Right Ear  - 2 x daily - 7 x weekly - 1  sets - 10 reps  - Self-Epley Maneuver Left Ear  - 2 x daily - 7 x weekly - 1 sets - 10 reps  - Brandt-Daroff Vestibular Exercise  - 2 x daily - 7 x weekly - 1 sets - 5 reps  - Rolling rightleft sides for vestibular habituation  - 2 x daily - 7 x weekly - 1 sets - 10 reps    Patient Education  - What Is BPPV?  - BPPV Other Exercises: Pt educated as to Self Epley maneuver as well as Holly [name] exercise.    General Comments General comments (skin integrity, edema, etc.): Tested pt for BPPV and pt positive for left anterior canal BPPV and treated with Epley maneuver.      Pertinent  Vitals/Pain Pain Assessment Pain Assessment: No/denies pain    Home Living                          Prior Function            PT Goals (current goals can now be found in the care plan section) Acute Rehab PT Goals Patient Stated Goal: to return to independence, improve balance Progress towards PT goals: Goals met/education completed, patient discharged from PT    Frequency    Min 2X/week      PT Plan      Co-evaluation              AM-PAC PT 6 Clicks Mobility   Outcome Measure  Help needed turning from your back to your side while in a flat bed without using bedrails?: None Help needed moving from lying on your back to sitting on the side of a flat bed without using bedrails?: None Help needed moving to and from a bed to a chair (including a wheelchair)?: None Help needed standing up from a chair using your arms (e.g., wheelchair or bedside chair)?: None Help needed to walk in hospital room?: A Little Help needed climbing 3-5 steps with a railing? : A Little 6 Click Score: 22    End of Session   Activity Tolerance: Patient tolerated treatment well Patient left: in chair;with call bell/phone within reach;with family/visitor present Nurse Communication: Mobility status PT Visit Diagnosis: Other abnormalities of gait and mobility (R26.89);Dizziness and giddiness (R42)     Time: 1251-1330 PT Time Calculation (min) (ACUTE ONLY): 39 min  Charges:    $Therapeutic Exercise: 8-22 mins $Self Care/Home Management: 8-22 $Canalith Rep Proc: 8-22 mins PT General Charges $$ ACUTE PT VISIT: 1 Visit                     Jasiah Elsen M,PT Acute Rehab Services [phone]    Stephane JULIANNA Bevel 06/17/2024, 2:22 PM

## 2024-06-20 LAB — CULTURE, BLOOD (ROUTINE X 2)
Culture: NO GROWTH
Culture: NO GROWTH

## 2024-08-03 ENCOUNTER — Encounter: Payer: Self-pay | Admitting: Radiology

## 2024-08-13 ENCOUNTER — Ambulatory Visit: Admitting: Podiatry

## 2024-08-18 ENCOUNTER — Ambulatory Visit: Admitting: Podiatry

## 2024-08-26 ENCOUNTER — Telehealth (HOSPITAL_BASED_OUTPATIENT_CLINIC_OR_DEPARTMENT_OTHER): Payer: Self-pay | Admitting: *Deleted

## 2024-08-26 NOTE — Telephone Encounter (Signed)
 Pt has been scheduled tele preop appt 09/03/24. Pt tells me her surgery is 09/08/24. Though the request was sent to our office as TBD.   Med rec and consent are done.     Patient Consent for Virtual Visit        Holly [name] has provided verbal consent on 08/26/2024 for a virtual visit (video or telephone).   CONSENT FOR VIRTUAL VISIT FOR:  Holly [name]  By participating in this virtual visit I agree to the following:  I hereby voluntarily request, consent and authorize [facility] and its employed or contracted physicians, physician assistants, nurse practitioners or other licensed health care professionals (the Practitioner), to provide me with telemedicine health care services (the "Services) as deemed necessary by the treating Practitioner. I acknowledge and consent to receive the Services by the Practitioner via telemedicine. I understand that the telemedicine visit will involve communicating with the Practitioner through live audiovisual communication technology and the disclosure of certain medical information by electronic transmission. I acknowledge that I have been given the opportunity to request an in-person assessment or other available alternative prior to the telemedicine visit and am voluntarily participating in the telemedicine visit.  I understand that I have the right to withhold or withdraw my consent to the use of telemedicine in the course of my care at any time, without affecting my right to future care or treatment, and that the Practitioner or I may terminate the telemedicine visit at any time. I understand that I have the right to inspect all information obtained and/or recorded in the course of the telemedicine visit and may receive copies of available information for a reasonable fee.  I understand that some of the potential risks of receiving the Services via telemedicine include:  Delay or interruption in medical evaluation due to technological  equipment failure or disruption; Information transmitted may not be sufficient (e.g. poor resolution of images) to allow for appropriate medical decision making by the Practitioner; and/or  In rare instances, security protocols could fail, causing a breach of personal health information.  Furthermore, I acknowledge that it is my responsibility to provide information about my medical history, conditions and care that is complete and accurate to the best of my ability. I acknowledge that Practitioner's advice, recommendations, and/or decision may be based on factors not within their control, such as incomplete or inaccurate data provided by me or distortions of diagnostic images or specimens that may result from electronic transmissions. I understand that the practice of medicine is not an exact science and that Practitioner makes no warranties or guarantees regarding treatment outcomes. I acknowledge that a copy of this consent can be made available to me via my patient portal [facility] MyChart), or I can request a printed copy by calling the office of [facility].    I understand that my insurance will be billed for this visit.   I have read or had this consent read to me. I understand the contents of this consent, which adequately explains the benefits and risks of the Services being provided via telemedicine.  I have been provided ample opportunity to ask questions regarding this consent and the Services and have had my questions answered to my satisfaction. I give my informed consent for the services to be provided through the use of telemedicine in my medical care

## 2024-08-26 NOTE — Telephone Encounter (Signed)
 Pt has been scheduled tele preop appt 09/03/24. Pt tells me her surgery is 09/08/24. Though the request was sent to our office as TBD.   Med rec and consent are done.

## 2024-08-26 NOTE — Telephone Encounter (Signed)
   Pre-operative Risk Assessment    Patient Name: Holly [name]  DOB: 1961/05/10 MRN: [id number]   Date of last office visit: 05/29/24 DR. BRIDGETTE CHRISTOPHER Date of next office visit: NONE   Request for Surgical Clearance    Procedure:  REMOVAL OF FINGER MASS ON (RIGHT)  Date of Surgery:  Clearance TBD                                Surgeon:  DR. OZELL HOCK Surgeon's Group or Practice Name:  [facility] Phone number:  [phone] Fax number:  [phone]   Type of Clearance Requested:   - Medical  - Pharmacy:  Hold Apixaban  (Eliquis )     Type of Anesthesia:  Not Indicated   Additional requests/questions:    Bonney Niels Jest   08/26/2024, 9:25 AM

## 2024-08-26 NOTE — Telephone Encounter (Signed)
Pt called to f/u-please advise.

## 2024-08-26 NOTE — Telephone Encounter (Signed)
 I have updated the date of procedure to reflect 09/08/24.

## 2024-08-26 NOTE — Telephone Encounter (Signed)
   Name: Holly [name]  DOB: 1961-08-19  MRN: [id number]  Primary Cardiologist: Shelda Bruckner, MD   Preoperative team, please contact this patient and set up a phone call appointment for further preoperative risk assessment. Please obtain consent and complete medication review. Thank you for your help.  I confirm that guidance regarding antiplatelet and oral anticoagulation therapy has been completed and, if necessary, noted below.  I also confirmed the patient resides in the state of [place] . As per [facility] Medical Board telemedicine laws, the patient must reside in the state in which the provider is licensed.   Jon Nat Hails, PA 08/26/2024, 1:03 PM [facility]

## 2024-08-26 NOTE — Telephone Encounter (Signed)
 I called the pt back who states the surgery scheduler from requesting office told her to call our office today about clearance.   I informed the pt that we just got this today, and yes we have it in process with the preop APP and the pharm-d. Once we have clearance we will reach out to her as well as let the surgeon office know.   I assured the pt that I will update the surgeon office.

## 2024-08-26 NOTE — Telephone Encounter (Signed)
 Patient with diagnosis of afib on Eliquis  for anticoagulation.    Procedure: REMOVAL OF FINGER MASS ON (RIGHT)  Date of procedure: TBD   CHA2DS2-VASc Score = 3   This indicates a 3.2% annual risk of stroke. The patient's score is based upon: CHF History: 0 HTN History: 0 Diabetes History: 1 Stroke History: 0 Vascular Disease History: 1 Age Score: 0 Gender Score: 1      CrCl 88 ml/min Platelet count 109  Patient has not had an Afib/aflutter ablation in the last 3 months, DCCV within the last 4 weeks or a watchman implanted in the last 45 days   Per office protocol, patient can hold Eliquis  for 2 days prior to procedure.    **This guidance is not considered finalized until pre-operative APP has relayed final recommendations.**

## 2024-09-03 ENCOUNTER — Ambulatory Visit: Attending: Cardiology | Admitting: Nurse Practitioner

## 2024-09-03 DIAGNOSIS — Z01810 Encounter for preprocedural cardiovascular examination: Secondary | ICD-10-CM

## 2024-09-03 NOTE — Progress Notes (Signed)
 Virtual Visit via Telephone Note   Because of Holly [name] co-morbid illnesses, she is at least at moderate risk for complications without adequate follow up.  This format is felt to be most appropriate for this patient at this time.  Due to technical limitations with video connection (technology), today's appointment will be conducted as an audio only telehealth visit, and Holly [name] verbally agreed to proceed in this manner.   All issues noted in this document were discussed and addressed.  No physical exam could be performed with this format.  Evaluation Performed:  Preoperative cardiovascular risk assessment _____________   Date:  09/03/2024   Patient ID:  Holly [name], DOB 10/15/1960, MRN [id number] Patient Location:  Home Provider location:   Office  Primary Care Provider:  Charlott [name] LABOR, MD Primary Cardiologist:  Holly Bruckner, MD  Chief Complaint / Patient Profile   63 y.o. y/o female with a h/o type 2 DM, coronary artery calcification, PAF on chronic anticoagulation, OSA on CPAP, obesity, HLD,  who is pending removal of finger mass with Dr. Germaine on date TBD and presents today for telephonic preoperative cardiovascular risk assessment.  History of Present Illness    Holly [name] is a 63 y.o. female who presents via audio/video conferencing for a telehealth visit today.  Pt was last seen in cardiology clinic on 05/29/24 by Dr. Bruckner.  At that time Holly [name] was doing well.  The patient is now pending procedure as outlined above. Since her last visit, she denies chest pain, shortness of breath, lower extremity edema, fatigue, palpitations, melena, hematuria, hemoptysis, presyncope, syncope, orthopnea, and PND. She is active around home and caring for her elderly father and is able to achieve > 4 METS activity without concerning cardiac symptoms.    Past Medical History    Past Medical History:  Diagnosis Date   Anxiety    takes  Ativan  daily   Atrial fibrillation (HCC)    Back pain    stenosis and buldging disc   Bone spur    neck and buldging disc   Depression    takes Cymbalta daily   Diabetes (HCC)    takes Metformin daily   Diverticulosis    GERD (gastroesophageal reflux disease)    takes Prevacid daily   History of bronchitis    > 5 yrs ago   History of hiatal hernia    Hypothyroidism    takes Synthroid  daily   IBS (irritable bowel syndrome)    Insomnia    doesn't take any meds   Joint pain    Lung nodule    right middle lobe-was being followed by Dr.Burney.Medical Md is keeping up with this   Migraine    Migraine    last one 08/28/15   Nocturia    Numbness and tingling in hands    PONV (postoperative nausea and vomiting)    Sleep apnea    Thoracic outlet syndrome    TOS (thoracic outlet syndrome)    Urinary urgency    Past Surgical History:  Procedure Laterality Date   ABDOMINAL EXPLORATION SURGERY     cancer cells in cervix   CARPAL TUNNEL RELEASE Right 09/05/2015   Procedure: RIGHT OPEN CARPAL TUNNEL RELEASE, RIGHT LONG FINGER TRIGGER RELEASE,RIGHT INDEX FINGER TRIGGER RELEASE;  Surgeon: Holly FORBES Better, MD;  Location: [facility];  Service: Orthopedics;  Laterality: Right;   CARPAL TUNNEL RELEASE Left 08/11/2019   Procedure: LEFT OPEN CARPAL TUNNEL RELEASE AND LEFT LONG  FINGER TRIGGER FINGER RELEASE;  Surgeon: Holly Holly BRAVO, MD;  Location: [facility];  Service: Orthopedics;  Laterality: Left;   CHOLECYSTECTOMY     COLONOSCOPY     ESOPHAGOGASTRODUODENOSCOPY     THYROIDECTOMY     TONSILLECTOMY     adenoidectomy   TRIGGER FINGER RELEASE Right 09/05/2015   Procedure: RELEASE TRIGGER FINGER/A-1 PULLEY;  Surgeon: Holly [name] Lucilla, MD;  Location: [facility];  Service: Orthopedics;  Laterality: Right;   TRIGGER FINGER RELEASE Left 08/11/2019   Procedure: RELEASE TRIGGER FINGER/A-1 PULLEY;  Surgeon: Holly Holly BRAVO, MD;  Location: [facility];  Service: Orthopedics;  Laterality:  Left;    Allergies  Allergies  Allergen Reactions   Cefuroxime Axetil Other (See Comments)    Obsessive Bowel.    Effexor Xr [Venlafaxine Hcl Er] Hives   Sumatriptan     heart issues    Erenumab-Aooe Hives     Smooth Muscle cramping in Esophagus  **Aimovig**   Garlic     Triggers migraines   Onion     Triggers migraines    Statins     myalgia   Zetia  [Ezetimibe ]     Muscle pain   Crestor [Rosuvastatin Calcium] Rash   Triptans Palpitations    Home Medications    Prior to Admission medications   Medication Sig Start Date End Date Taking? Authorizing Provider  acetaminophen  (TYLENOL ) 500 MG tablet Take 500 mg by mouth every 6 (six) hours as needed.    [provider]  apixaban  (ELIQUIS ) 5 MG TABS tablet Take 1 tablet (5 mg total) by mouth 2 (two) times daily. 05/29/24   Holly Slain, MD  Buprenorphine  HCl-Naloxone  HCl 4-1 MG FILM Place 2 Film under the tongue 2 (two) times daily. 06/22/22   [provider]  Cholecalciferol 125 MCG (5000 UT) capsule Take 5,000 Units by mouth daily.    [provider]  conjugated estrogens (PREMARIN) 0.625 MG/GM vaginal cream Place 1 Applicatorful vaginally daily as needed. 12/06/14   [provider]  DiphenhydrAMINE  HCl (BENADRYL  ALLERGY PO) Take by mouth as needed. Taken with advil  for migraine management.    [provider]  famotidine  (PEPCID ) 20 MG tablet Take 20 mg by mouth daily as needed for heartburn.    [provider]  glucose blood test strip  01/10/15   [provider]  lansoprazole (PREVACID) 30 MG capsule Take 30 mg by mouth daily as needed (for acid reflux).    [provider]  levothyroxine  (SYNTHROID ) 100 MCG tablet Take 100 mcg by mouth See admin instructions. Take 1 tablet by mouth daily, except on Sunday 07/16/22   [provider]  linaclotide (LINZESS) 145 MCG CAPS capsule TAKE 1 CAP BY MOUTH EVERY DAY AT LEAST 30 MINUTES BEFORE THE 1ST  MAIN MEAL OF DAY ON EMPTY STOMACH Patient not taking: Reported on 06/15/2024    [provider]  LORazepam  (ATIVAN ) 2 MG tablet Take 2 mg by mouth 4 (four) times daily.    [provider]  metFORMIN (GLUCOPHAGE) 500 MG tablet Take 1,000 mg by mouth 2 (two) times daily as needed (for blood glucose 170 or greater).    [provider]  OnabotulinumtoxinA (BOTOX IJ) Inject as directed every 3 (three) months. For migraine prevention.    [provider]    Physical Exam    Vital Signs:  Holly [name] does not have vital signs available for review today.  Given telephonic nature of communication, physical exam  is limited. AAOx3. NAD. Normal affect.  Speech and respirations are unlabored.  Accessory Clinical Findings    None  Assessment & Plan    1.  Preoperative Cardiovascular Risk Assessment: According to the Revised Cardiac Risk Index (RCRI), her Perioperative Risk of Major Cardiac Event is (%): 0.4. Her Functional Capacity in METs is: 7.04 according to the Duke Activity Status Index (DASI). The patient is doing well from a cardiac perspective. Therefore, based on ACC/AHA guidelines, the patient would be at acceptable risk for the planned procedure without further cardiovascular testing.   The patient was advised that if she develops new symptoms prior to surgery to contact our office to arrange for a follow-up visit, and she verbalized understanding.  Per office protocol, she may hold Eliquis  for 2 days prior to procedure and should resume as soon as hemodynamically stable postoperatively.  A copy of this note will be routed to requesting surgeon.  Time:   Today, I have spent 10 minutes with the patient with telehealth technology discussing medical history, symptoms, and management plan.     Rosaline EMERSON Bane, NP-C  09/03/2024, 2:51 PM [address], [address] [place], [place] [address] Office [phone] Fax [phone]

## 2024-10-06 ENCOUNTER — Telehealth (HOSPITAL_BASED_OUTPATIENT_CLINIC_OR_DEPARTMENT_OTHER): Payer: Self-pay | Admitting: *Deleted

## 2024-10-06 NOTE — Telephone Encounter (Signed)
"  ° °  Pre-operative Risk Assessment    Patient Name: Holly [name]  DOB: 01/16/1961 MRN: [id number]   Date of last office visit: 05/29/24 DR. BRIDGETTE CHRISTOPHER Date of next office visit: NONE   Request for Surgical Clearance    Procedure:  BLEPHAROPLASTY UPPER LIDS  Date of Surgery:  Clearance 10/30/24                                Surgeon:  DR. CATHERINE CUITE Surgeon's Group or Practice Name:  [facility] Phone number:  [phone] Fax number:  [phone]   Type of Clearance Requested:   - Medical  - Pharmacy:  Hold Apixaban  (Eliquis ) x 2 DAYS PRIOR AND RESUME 2 DAYS POST OP   Type of Anesthesia:  Not Indicated   Additional requests/questions:    Bonney Niels Jest   10/06/2024, 5:18 PM   "

## 2024-10-07 NOTE — Telephone Encounter (Signed)
" ° °  Patient Name: Holly [name]  DOB: April 22, 1961 MRN: [id number]  Primary Cardiologist: Shelda Bruckner, MD  Chart reviewed as part of pre-operative protocol coverage. Pre-op clearance already addressed by colleagues in earlier phone notes. To summarize recommendations:  Patient recently had excision of ganglion on right index finger on 09/08/24. Prior to this, she had a tele visit on 09/03/24 with our office for preop exam. At that time, patient denied chest pain, shortness of breath ,lower extremity edema, fatigue, palpitations, syncope, near syncope, orthopnea, PND. She was able to achieve >4 METS activity. PharmD recommended holding eliquis  for 2 days prior to procedure.   I called patient today to discuss her upcoming Blepharoplasty. She denies having any new cardiac symptoms. She did not have any complications with her ganglion excision. She has not had any cardiac procedures or studies since her previous tele visit for preop examination. As she has not developed new cardiac symptoms since tele visit on 12/4, OK to proceed with Blepharoplasty without further cardiac workup. OK to hold eliquis  for 2 days before as requested. Recommend resuming eliquis  as soon as surgeon believes it is safe and appropriate to do so.   Will route this bundled recommendation to requesting provider via Epic fax function and remove from pre-op pool. Please call with questions.  Rollo FABIENE Louder, PA-C 10/07/2024, 9:58 AM  "

## 2024-10-09 ENCOUNTER — Encounter (INDEPENDENT_AMBULATORY_CARE_PROVIDER_SITE_OTHER): Payer: Medicare Other | Admitting: Ophthalmology

## 2024-10-09 DIAGNOSIS — E113293 Type 2 diabetes mellitus with mild nonproliferative diabetic retinopathy without macular edema, bilateral: Secondary | ICD-10-CM | POA: Diagnosis not present

## 2024-10-09 DIAGNOSIS — H43813 Vitreous degeneration, bilateral: Secondary | ICD-10-CM | POA: Diagnosis not present

## 2024-10-09 DIAGNOSIS — Z7984 Long term (current) use of oral hypoglycemic drugs: Secondary | ICD-10-CM | POA: Diagnosis not present

## 2025-10-08 ENCOUNTER — Encounter (INDEPENDENT_AMBULATORY_CARE_PROVIDER_SITE_OTHER): Admitting: Ophthalmology
